# Patient Record
Sex: Male | Born: 1965
Health system: Southern US, Community
[De-identification: ages and names within clinical notes are randomized; demographics above are authoritative.]

## PROBLEM LIST (undated history)

## (undated) ENCOUNTER — Emergency Department: Payer: BC Managed Care – PPO

## (undated) DIAGNOSIS — Z87442 Personal history of urinary calculi: Secondary | ICD-10-CM

## (undated) DIAGNOSIS — R112 Nausea with vomiting, unspecified: Secondary | ICD-10-CM

## (undated) DIAGNOSIS — J45909 Unspecified asthma, uncomplicated: Secondary | ICD-10-CM

## (undated) DIAGNOSIS — M1990 Unspecified osteoarthritis, unspecified site: Secondary | ICD-10-CM

## (undated) DIAGNOSIS — M25512 Pain in left shoulder: Secondary | ICD-10-CM

## (undated) DIAGNOSIS — T7840XA Allergy, unspecified, initial encounter: Secondary | ICD-10-CM

## (undated) DIAGNOSIS — N200 Calculus of kidney: Secondary | ICD-10-CM

## (undated) DIAGNOSIS — Z9889 Other specified postprocedural states: Secondary | ICD-10-CM

## (undated) DIAGNOSIS — M7580 Other shoulder lesions, unspecified shoulder: Secondary | ICD-10-CM

## (undated) DIAGNOSIS — M25511 Pain in right shoulder: Secondary | ICD-10-CM

## (undated) DIAGNOSIS — M109 Gout, unspecified: Secondary | ICD-10-CM

## (undated) HISTORY — DX: Nausea with vomiting, unspecified: R11.2

## (undated) HISTORY — DX: Pain in right shoulder: M25.511

## (undated) HISTORY — DX: Pain in left shoulder: M25.512

## (undated) HISTORY — DX: Unspecified asthma, uncomplicated: J45.909

## (undated) HISTORY — DX: Other specified postprocedural states: Z98.890

## (undated) HISTORY — PX: APPENDECTOMY: SHX54

## (undated) HISTORY — DX: Other shoulder lesions, unspecified shoulder: M75.80

## (undated) HISTORY — DX: Allergy, unspecified, initial encounter: T78.40XA

## (undated) HISTORY — PX: TONSILLECTOMY: SUR1361

---

## 2001-05-29 HISTORY — PX: OTHER SURGICAL HISTORY: SHX169

## 2002-05-30 ENCOUNTER — Encounter (HOSPITAL_COMMUNITY): Admission: RE | Admit: 2002-05-30 | Discharge: 2002-06-29 | Payer: Self-pay | Admitting: Orthopedic Surgery

## 2003-02-27 ENCOUNTER — Ambulatory Visit: Admission: RE | Admit: 2003-02-27 | Discharge: 2003-02-27 | Payer: Self-pay | Admitting: Family Medicine

## 2004-05-31 ENCOUNTER — Encounter: Payer: Self-pay | Admitting: Orthopedic Surgery

## 2004-05-31 ENCOUNTER — Emergency Department (HOSPITAL_COMMUNITY): Admission: EM | Admit: 2004-05-31 | Discharge: 2004-05-31 | Payer: Self-pay | Admitting: Emergency Medicine

## 2004-06-02 ENCOUNTER — Emergency Department (HOSPITAL_COMMUNITY): Admission: EM | Admit: 2004-06-02 | Discharge: 2004-06-02 | Payer: Self-pay | Admitting: Emergency Medicine

## 2004-06-06 ENCOUNTER — Ambulatory Visit: Payer: Self-pay | Admitting: Orthopedic Surgery

## 2004-06-09 ENCOUNTER — Ambulatory Visit: Payer: Self-pay | Admitting: Orthopedic Surgery

## 2006-03-06 ENCOUNTER — Emergency Department (HOSPITAL_COMMUNITY): Admission: EM | Admit: 2006-03-06 | Discharge: 2006-03-06 | Payer: Self-pay | Admitting: Emergency Medicine

## 2006-03-09 ENCOUNTER — Emergency Department (HOSPITAL_COMMUNITY): Admission: EM | Admit: 2006-03-09 | Discharge: 2006-03-09 | Payer: Self-pay | Admitting: Emergency Medicine

## 2006-03-11 ENCOUNTER — Emergency Department (HOSPITAL_COMMUNITY): Admission: EM | Admit: 2006-03-11 | Discharge: 2006-03-11 | Payer: Self-pay | Admitting: *Deleted

## 2006-03-25 ENCOUNTER — Ambulatory Visit (HOSPITAL_COMMUNITY): Admission: RE | Admit: 2006-03-25 | Discharge: 2006-03-25 | Payer: Self-pay | Admitting: Urology

## 2006-04-16 ENCOUNTER — Ambulatory Visit (HOSPITAL_COMMUNITY): Admission: RE | Admit: 2006-04-16 | Discharge: 2006-04-16 | Payer: Self-pay | Admitting: Urology

## 2006-04-18 ENCOUNTER — Ambulatory Visit (HOSPITAL_COMMUNITY): Admission: RE | Admit: 2006-04-18 | Discharge: 2006-04-18 | Payer: Self-pay | Admitting: Urology

## 2006-04-18 ENCOUNTER — Emergency Department (HOSPITAL_COMMUNITY): Admission: EM | Admit: 2006-04-18 | Discharge: 2006-04-18 | Payer: Self-pay | Admitting: Emergency Medicine

## 2006-04-23 ENCOUNTER — Ambulatory Visit (HOSPITAL_COMMUNITY): Admission: RE | Admit: 2006-04-23 | Discharge: 2006-04-23 | Payer: Self-pay | Admitting: Urology

## 2006-09-03 ENCOUNTER — Emergency Department (HOSPITAL_COMMUNITY): Admission: EM | Admit: 2006-09-03 | Discharge: 2006-09-03 | Payer: Self-pay | Admitting: Emergency Medicine

## 2006-09-05 ENCOUNTER — Emergency Department (HOSPITAL_COMMUNITY): Admission: EM | Admit: 2006-09-05 | Discharge: 2006-09-05 | Payer: Self-pay | Admitting: Emergency Medicine

## 2006-09-07 ENCOUNTER — Emergency Department (HOSPITAL_COMMUNITY): Admission: EM | Admit: 2006-09-07 | Discharge: 2006-09-07 | Payer: Self-pay | Admitting: Emergency Medicine

## 2007-06-05 ENCOUNTER — Ambulatory Visit (HOSPITAL_COMMUNITY): Admission: RE | Admit: 2007-06-05 | Discharge: 2007-06-05 | Payer: Self-pay | Admitting: Family Medicine

## 2009-02-24 ENCOUNTER — Ambulatory Visit (HOSPITAL_COMMUNITY): Admission: RE | Admit: 2009-02-24 | Discharge: 2009-02-24 | Payer: Self-pay | Admitting: Urology

## 2009-03-04 ENCOUNTER — Ambulatory Visit (HOSPITAL_COMMUNITY): Admission: RE | Admit: 2009-03-04 | Discharge: 2009-03-04 | Payer: Self-pay | Admitting: Urology

## 2010-06-28 NOTE — Progress Notes (Signed)
Summary: Office Visit  Office Visit   Imported By: Jacklynn Ganong 09/04/2007 13:36:08  _____________________________________________________________________  External Attachment:    Type:   Image     Comment:   initial evaluation

## 2010-06-28 NOTE — Progress Notes (Signed)
Summary: Office Visit  Office Visit   Imported By: Jacklynn Ganong 09/04/2007 13:36:46  _____________________________________________________________________  External Attachment:    Type:   Image     Comment:   progress note

## 2010-10-14 NOTE — H&P (Signed)
Marcus Chen, Marcus Chen                ACCOUNT NO.:  [id number]   MEDICAL RECORD NO.:  [id number]          PATIENT TYPE:  AMB   LOCATION:  DAY                           FACILITY:  [facility]   PHYSICIAN:  Dennie Maizes, M.D.   DATE OF BIRTH:  1966-04-19   DATE OF ADMISSION:  04/18/2006  DATE OF DISCHARGE:  LH                                HISTORY & PHYSICAL   CHIEF COMPLAINT:  Intermittent left flank pain, left upper ureteral calculus  with obstruction.   HISTORY OF PRESENT ILLNESS:  This 45 year old male experienced sudden onset  of severe left flank pain radiating to the front about 3 months ago.  He  went to the emergency room at [facility].  He was evaluated with a  noncontrast CT scan of abdomen and pelvis.  This revealed bilateral small  renal calculi.  The patient had good pain relief at that time.   He had recurrence of the pain on the left side about a month ago.  He went  to the emergency room x3 at [facility].  CT scan has been  repeated.  This revealed multiple bilateral renal calculi without  obstruction.  There was a 3 mm size stone in the left upper ureter with  perinephric edema.  The patient has not passed a stone.  He has been having  intermittent left flank pain.  He did not have any severe voiding difficulty  or gross hematuria at present.  He is brought to the shortstay center today  for extracorporeal shockwave lithotripsy of the left upper ureteral  calculus.   PAST MEDICAL HISTORY:  Negative for urolithiasis, no medical illnesses.  Status post gastric bypass surgery in 2005.  The patient has lost about 135  pounds after the surgery.  Status post tonsillectomy, status post  appendectomy.   MEDICATIONS:  1. Hydromorphone.  2. Flomax.  3. Ventolin inhaler.   ALLERGIES:  PENICILLIN.   FAMILY HISTORY:  Positive for hypertension, heart disease, diabetes  mellitus, and elevated cholesterol.   PHYSICAL EXAMINATION:  GENERAL:  The patient  is comfortable at the time of  examination.  VITAL SIGNS:  Height 5 feet 10-1/2 inches, weight 175 pounds.  HEENT:  Normal.  LUNGS:  Clear to auscultation.  HEART:  Regular rate and rhythm with no murmurs.  ABDOMEN:  Soft, no palpable flank mass.  No costovertebral angle tenderness.  Bladder is not palpable.  No  cva tenderness.  Penis and testes are normal.   IMPRESSION:  Left flank pain, left ureteral calculus with obstructions,  bilateral small renal calculi.   PLAN:  1. Extracorporeal shockwave lithotripsy of the obstructing left upper      ureteral calculus with [facility].  I      discussed with the patient regarding the diagnosis, operative details,      alternate treatments, outcome, possible risks and complications and he      has agreed for the procedure to be done.  He may need additional      procedures for the stone.  Dennie Maizes, M.D.  Electronically Signed     SK/MEDQ  D:  04/17/2006  T:  04/18/2006  Job:  [id number]   cc:   Mila Homer. Sudie Bailey, M.D.  Fax: [phone]

## 2012-01-25 DIAGNOSIS — F909 Attention-deficit hyperactivity disorder, unspecified type: Secondary | ICD-10-CM

## 2012-03-23 ENCOUNTER — Emergency Department (HOSPITAL_COMMUNITY): Payer: PRIVATE HEALTH INSURANCE

## 2012-03-23 ENCOUNTER — Encounter (HOSPITAL_COMMUNITY): Payer: Self-pay | Admitting: *Deleted

## 2012-03-23 ENCOUNTER — Emergency Department (HOSPITAL_COMMUNITY)
Admission: EM | Admit: 2012-03-23 | Discharge: 2012-03-23 | Disposition: A | Discharge status: Home / Self Care | Payer: PRIVATE HEALTH INSURANCE | Attending: Emergency Medicine | Admitting: Emergency Medicine

## 2012-03-23 DIAGNOSIS — N23 Unspecified renal colic: Secondary | ICD-10-CM

## 2012-03-23 DIAGNOSIS — M109 Gout, unspecified: Secondary | ICD-10-CM | POA: Insufficient documentation

## 2012-03-23 DIAGNOSIS — R1032 Left lower quadrant pain: Secondary | ICD-10-CM | POA: Insufficient documentation

## 2012-03-23 DIAGNOSIS — N201 Calculus of ureter: Secondary | ICD-10-CM | POA: Insufficient documentation

## 2012-03-23 HISTORY — DX: Calculus of kidney: N20.0

## 2012-03-23 HISTORY — DX: Gout, unspecified: M10.9

## 2012-03-23 LAB — URINALYSIS, ROUTINE W REFLEX MICROSCOPIC
Bilirubin Urine: NEGATIVE
Glucose, UA: NEGATIVE mg/dL
Ketones, ur: NEGATIVE mg/dL
Leukocytes, UA: NEGATIVE
Nitrite: NEGATIVE
Protein, ur: 30 mg/dL — AB
Specific Gravity, Urine: >1.03 — ABNORMAL HIGH (ref 1.005–1.030)
Urobilinogen, UA: 0.2 mg/dL (ref 0.0–1.0)
pH: 5 (ref 5.0–8.0)

## 2012-03-23 LAB — URINE MICROSCOPIC-ADD ON

## 2012-03-23 MED ORDER — HYDROMORPHONE HCL PF 1 MG/ML IJ SOLN
1.0000 mg | Freq: Once | INTRAMUSCULAR | Status: AC
  Administered 2012-03-23: 1 mg via INTRAVENOUS

## 2012-03-23 MED ORDER — KETOROLAC TROMETHAMINE 30 MG/ML IJ SOLN
30.0000 mg | Freq: Once | INTRAMUSCULAR | Status: AC
  Administered 2012-03-23: 30 mg via INTRAVENOUS
  Filled 2012-03-23: qty 1

## 2012-03-23 MED ORDER — ONDANSETRON HCL 4 MG/2ML IJ SOLN
4.0000 mg | Freq: Once | INTRAMUSCULAR | Status: AC
  Administered 2012-03-23: 4 mg via INTRAVENOUS
  Filled 2012-03-23: qty 2

## 2012-03-23 MED ORDER — HYDROMORPHONE HCL PF 1 MG/ML IJ SOLN
0.5000 mg | Freq: Once | INTRAMUSCULAR | Status: AC
  Administered 2012-03-23: 0.5 mg via INTRAVENOUS
  Filled 2012-03-23: qty 1

## 2012-03-23 MED ORDER — FENTANYL CITRATE 0.05 MG/ML IJ SOLN
50.0000 ug | Freq: Once | INTRAMUSCULAR | Status: DC
  Filled 2012-03-23: qty 2

## 2012-03-23 MED ORDER — TAMSULOSIN HCL 0.4 MG PO CAPS: ORAL_CAPSULE | ORAL | Status: DC

## 2012-03-23 MED ORDER — HYDROMORPHONE HCL PF 1 MG/ML IJ SOLN
INTRAMUSCULAR | Status: AC
  Filled 2012-03-23: qty 1

## 2012-03-23 MED ORDER — OXYCODONE-ACETAMINOPHEN 5-325 MG PO TABS
2.0000 | ORAL_TABLET | Freq: Once | ORAL | Status: AC
  Administered 2012-03-23: 2 via ORAL
  Filled 2012-03-23: qty 2

## 2012-03-23 MED ORDER — HYDROMORPHONE HCL PF 1 MG/ML IJ SOLN
INTRAMUSCULAR | Status: AC
  Administered 2012-03-23: 1 mg via INTRAVENOUS
  Filled 2012-03-23: qty 1

## 2012-03-23 MED ORDER — OXYCODONE-ACETAMINOPHEN 5-325 MG PO TABS: 2.0000 | ORAL_TABLET | ORAL | Status: DC | PRN

## 2012-03-23 NOTE — ED Provider Notes (Signed)
History   This chart was scribed for Flint Melter, MD scribed by Magnus Sinning. The patient was seen in room [facility] at 13:22   CSN: [id number]  Arrival date & time 03/23/12  1303   Chief Complaint  Patient presents with  . Nephrolithiasis    (Consider location/radiation/quality/duration/timing/severity/associated sxs/prior treatment) The history is provided by the patient. No language interpreter was used.  Marcus Chen is a 46 y.o. male who presents to the Emergency Department complaining of constant moderate left lower quadrant abd pain that began approximately 45 minutes ago with an associated shooting pain to his left testicle and left lower back. Patient reports pain has been consistent with hx of kidney stones. 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 He says he initially attributed pain to pulling a muscle during the lift, but notes after he sat down, the pain intensified and began shooting into left testicle. Patient reports there are no modifying factors and does not provided any other associated sxs.   Past Medical History  Diagnosis Date  . Kidney stone   . Gout     Past Surgical History  Procedure Date  . Appendectomy     No family history on file.  History  Substance Use Topics  . Smoking status: Never Smoker   . Smokeless tobacco: Not on file  . Alcohol Use: No     occ      Review of Systems  Gastrointestinal: Positive for abdominal pain. Negative for nausea and vomiting.  Genitourinary: Positive for flank pain and testicular pain.  Musculoskeletal: Positive for back pain.  All other systems reviewed and are negative.    Allergies  Penicillins  Home Medications   Current Outpatient Rx  Name Route Sig Dispense Refill  . ALLOPURINOL 300 MG PO TABS Oral Take 300 mg by mouth daily.    . FEBUXOSTAT 80 MG PO TABS Oral Take 80 mg by mouth daily.    . IBUPROFEN 200 MG PO TABS  Oral Take 800 mg by mouth every 6 (six) hours as needed. Pain    . MELOXICAM 7.5 MG PO TABS Oral Take 7.5 mg by mouth daily.    . METHYLCELLULOSE 1 % OP SOLN Both Eyes Place 1 drop into both eyes as needed.    Marland Kitchen OMEPRAZOLE 20 MG PO CPDR Oral Take 20 mg by mouth daily.    . OXYCODONE-ACETAMINOPHEN 5-325 MG PO TABS Oral Take 2 tablets by mouth every 4 (four) hours as needed for pain. 30 tablet 0  . TAMSULOSIN HCL 0.4 MG PO CAPS  1 q HS to aid stone passage 7 capsule 0    BP 146/101  Pulse 91  Temp 97.3 F (36.3 C) (Oral)  Resp 24  SpO2 98%  Physical Exam  Nursing note and vitals reviewed. Constitutional: He is oriented to person, place, and time. He appears well-developed and well-nourished. No distress.  HENT:  Head: Normocephalic and atraumatic.  Eyes: Conjunctivae normal and EOM are normal.  Neck: Neck supple. No tracheal deviation present.  Cardiovascular: Normal rate and regular rhythm.   No murmur heard. Pulmonary/Chest: Effort normal and breath sounds normal. No respiratory distress. He has no wheezes. He has no rales.  Abdominal: Soft. He exhibits no distension. There is no tenderness.       No CVA tenderness.  Musculoskeletal: Normal range of motion.  Neurological: He is alert and oriented to person, place, and time.  No sensory deficit.  Skin: Skin is dry.  Psychiatric: He has a normal mood and affect. His behavior is normal.    ED Course  Procedures (including critical care time) DIAGNOSTIC STUDIES: Oxygen Saturation is 98% on room air , normal by my interpretation.    COORDINATION OF CARE: 13:43: Rechecked performed.Patient notes current nagging pain that he ranks between a 3 and 4 out of 10.  13:45: Asked the nurse to change the IV fluids to [facility]. 14:15: Rechecked performed. Patient provided he continues to have pain. Patient given third dose of Dilaudid and Zofran.  15:50: Informed patient that radiology shows there are stones distal to the kidneys with stones  noted in both kidneys. Patient states that the pain has been surging. Provided intent to place order for Toradol. 15:16: Patient notes improvement with the Toradol. Provided intent to d/c home with percocet rx and Flomax rx with instructions for rest and f/u with a urologist.      17:15-patient stated that he was not comfortable enough to leave. He was worried that his pain would not be able to be controlled at home. I contacted Dr. Jerre Simon, who agreed to see him as a Research scientist (medical). He suggested that the patient be admitted to hospitalist service. I presented this plan to the patient and he declined admission. He stated that he would see Dr. Jerre Simon in the office.           Labs Reviewed  URINALYSIS, ROUTINE W REFLEX MICROSCOPIC - Abnormal; Notable for the following:    Color, Urine AMBER (*)  BIOCHEMICALS MAY BE AFFECTED BY COLOR   APPearance CLOUDY (*)     Specific Gravity, Urine >1.030 (*)     Hgb urine dipstick LARGE (*)     Protein, ur 30 (*)     All other components within normal limits  URINE MICROSCOPIC-ADD ON - Abnormal; Notable for the following:    Bacteria, UA MANY (*)     All other components within normal limits  URINE CULTURE   Ct Abdomen Pelvis Wo Contrast  03/23/2012  *RADIOLOGY REPORT*  Clinical Data: 46 year old male with left-sided abdominal and pelvic pain.  CT ABDOMEN AND PELVIS WITHOUT CONTRAST  Technique:  Multidetector CT imaging of the abdomen and pelvis was performed following the standard protocol without intravenous contrast.  Comparison: 02/21/2009 CT  Findings: A 3 mm proximal left ureteral calculus is noted causing very mild left hydronephrosis.  Punctate nonobstructing bilateral renal calculi are identified, three on the left and two on the right.  Fatty infiltration of the liver is identified. The spleen, gallbladder, adrenal glands and pancreas are unremarkable.  Please note that parenchymal abnormalities may be missed as intravenous contrast was not  administered.  No free fluid, enlarged lymph nodes, biliary dilation or abdominal aortic aneurysm identified. Sigmoid colonic diverticulosis noted without evidence of diverticulitis. Surgical changes along the stomach and evidence of appendectomy are present.  No acute or suspicious bony abnormalities are identified. Degenerative changes in the lower lumbar spine again noted.  IMPRESSION: 3 mm proximal left ureteral calculus causing very mild left hydronephrosis.  Punctate nonobstructing bilateral renal calculi.  Fatty infiltration of the liver.   Original Report Authenticated By: Rosendo Gros, M.D.      1. Ureteral stone   2. Ureteral colic       MDM   Ureteral colic, secondary to proximal 3 mm stone. The stone has a greater than 75% chance of passage. Patient's symptoms controlled in the emergency department. Doubt  metabolic instability, serious bacterial infection or impending vascular collapse; the patient is stable for discharge.     I personally performed the services described in this documentation, which was scribed in my presence. The recorded information has been reviewed and considered.         Flint Melter, MD 03/23/12 1729

## 2012-03-23 NOTE — ED Notes (Signed)
Pt return from  CT states he still has some pain.

## 2012-03-23 NOTE — ED Notes (Signed)
Left flank pain radiating to left groin that started today.  Denies n/v.

## 2012-03-24 LAB — URINE CULTURE
Colony Count: NO GROWTH
Culture: NO GROWTH

## 2012-05-29 HISTORY — PX: ROTATOR CUFF REPAIR: SHX139

## 2013-11-26 ENCOUNTER — Ambulatory Visit (HOSPITAL_COMMUNITY)
Admission: RE | Admit: 2013-11-26 | Discharge: 2013-11-26 | Disposition: A | Discharge status: Home / Self Care | Payer: PRIVATE HEALTH INSURANCE | Source: Ambulatory Visit | Attending: Family Medicine | Admitting: Family Medicine

## 2013-11-26 ENCOUNTER — Other Ambulatory Visit (HOSPITAL_COMMUNITY): Payer: Self-pay | Admitting: Family Medicine

## 2013-11-26 DIAGNOSIS — M545 Low back pain, unspecified: Secondary | ICD-10-CM

## 2013-11-26 DIAGNOSIS — R079 Chest pain, unspecified: Secondary | ICD-10-CM

## 2014-01-13 ENCOUNTER — Other Ambulatory Visit (HOSPITAL_COMMUNITY): Payer: Self-pay | Admitting: Orthopedic Surgery

## 2014-01-13 DIAGNOSIS — M25511 Pain in right shoulder: Secondary | ICD-10-CM

## 2014-01-16 ENCOUNTER — Ambulatory Visit (HOSPITAL_COMMUNITY)
Admission: RE | Admit: 2014-01-16 | Discharge: 2014-01-16 | Disposition: A | Discharge status: Home / Self Care | Payer: PRIVATE HEALTH INSURANCE | Source: Ambulatory Visit | Attending: Orthopedic Surgery | Admitting: Orthopedic Surgery

## 2014-01-16 DIAGNOSIS — M6790 Unspecified disorder of synovium and tendon, unspecified site: Secondary | ICD-10-CM | POA: Insufficient documentation

## 2014-01-16 DIAGNOSIS — M25511 Pain in right shoulder: Secondary | ICD-10-CM

## 2014-01-16 DIAGNOSIS — S46819A Strain of other muscles, fascia and tendons at shoulder and upper arm level, unspecified arm, initial encounter: Secondary | ICD-10-CM | POA: Insufficient documentation

## 2014-01-16 DIAGNOSIS — X58XXXA Exposure to other specified factors, initial encounter: Secondary | ICD-10-CM | POA: Diagnosis not present

## 2014-01-16 DIAGNOSIS — S43499A Other sprain of unspecified shoulder joint, initial encounter: Secondary | ICD-10-CM | POA: Insufficient documentation

## 2014-01-16 DIAGNOSIS — M719 Bursopathy, unspecified: Secondary | ICD-10-CM | POA: Insufficient documentation

## 2014-01-16 DIAGNOSIS — M25519 Pain in unspecified shoulder: Secondary | ICD-10-CM | POA: Diagnosis present

## 2014-02-10 ENCOUNTER — Ambulatory Visit (HOSPITAL_COMMUNITY)
Admission: RE | Admit: 2014-02-10 | Discharge: 2014-02-10 | Disposition: A | Discharge status: Home / Self Care | Payer: PRIVATE HEALTH INSURANCE | Source: Ambulatory Visit | Attending: Orthopedic Surgery | Admitting: Orthopedic Surgery

## 2014-02-10 DIAGNOSIS — M25611 Stiffness of right shoulder, not elsewhere classified: Secondary | ICD-10-CM | POA: Insufficient documentation

## 2014-02-10 DIAGNOSIS — M6281 Muscle weakness (generalized): Secondary | ICD-10-CM | POA: Diagnosis not present

## 2014-02-10 DIAGNOSIS — M25519 Pain in unspecified shoulder: Secondary | ICD-10-CM | POA: Diagnosis not present

## 2014-02-10 DIAGNOSIS — M25619 Stiffness of unspecified shoulder, not elsewhere classified: Secondary | ICD-10-CM | POA: Insufficient documentation

## 2014-02-10 DIAGNOSIS — Z9889 Other specified postprocedural states: Secondary | ICD-10-CM | POA: Insufficient documentation

## 2014-02-10 DIAGNOSIS — M25511 Pain in right shoulder: Secondary | ICD-10-CM

## 2014-02-10 DIAGNOSIS — IMO0001 Reserved for inherently not codable concepts without codable children: Secondary | ICD-10-CM | POA: Insufficient documentation

## 2014-02-10 NOTE — Evaluation (Signed)
Occupational Therapy Evaluation  Patient Details  Name: Marcus Chen MRN: [id number] Date of Birth: [birth year]  Today's Date: 02/10/2014 Time: 7209-4709 OT Time Calculation (min): 44 min OT eval: 6283-6629 44'  Visit#: 1 of 24  Re-eval: 03/10/14  Assessment Diagnosis: Right rotator cuff repair Surgical Date: 01/28/14 Next MD Visit: 03/03/2014   Past Medical History:  Past Medical History  Diagnosis Date  . Kidney stone   . Gout    Past Surgical History:  Past Surgical History  Procedure Laterality Date  . Appendectomy      Subjective Symptoms/Limitations Symptoms: S: It starts to pinch and really hurt right at my bicep.  Pertinent History: Pt is a 48 y/o male s/p right rotator cuff repair (01/28/14), pt states that bone spurs were removed from shoulder.  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 Patient reports difficulty with all daily and work tasks, as well as has difficulty sleeping due to pain in his right shoulder. Pt reports he preciously had surgery on his left shoulder and requested therapy include TENS for pain management as it seemed to help significantly for his left shoulder. Dr. Ronnie Derby referred patient to occupational therapy for evaluation and treatment.    Special Tests: FOTO score: 39/100 Patient Stated Goals: To get my arm working like normal so I can do everything I need to.  Pain Assessment Currently in Pain?: Other (Comment) (No pain at time of eval. Pt reports pain of 4/10 at night and pain of 7/10 with movement) Pain Relieving Factors: Pt takes pain medication on a 6 hour schedule. Pt reports alternating ice and heat for pain management.   Precautions/Restrictions  Precautions Precautions: Shoulder Type of Shoulder Precautions: PROM for 4 weeks (03/10/14). Progress to AAROM, AROM, and strengthening as tolerated.  Precaution Comments: No lifting over 20# and no lifting over the head  Balance  Screening Balance Screen Has the patient fallen in the past 6 months: No Has the patient had a decrease in activity level because of a fear of falling? : No Is the patient reluctant to leave their home because of a fear of falling? : No  Prior [place] expects to be discharged to:: Private residence Living Arrangements: Spouse/significant other;Children Prior Function Level of Independence: Independent with basic ADLs  Able to Take Stairs?: Yes Driving: Yes Vocation: Full time employment Vocation Requirements: Airline pilot at [employer] -Work requirements lifting large loads, climbing, reaching, crawling, carrying heavy loads Leisure: Hobbies-yes (Comment) Comments: has been renovating his house-would like to return to that  Assessment ADL/Vision/Perception ADL ADL Comments: Has difficulty with everything-dressing, work functions (firefighter/engineer), reaching & lifting tasks.  Dominant Hand: Right Vision - History Baseline Vision: No visual deficits  Cognition/Observation Cognition Overall Cognitive Status: Within Functional Limits for tasks assessed Arousal/Alertness: Awake/alert Orientation Level: Oriented X4 Observation/Other Assessments Observations: Scar along right shoulder measures 3 cm    Additional Assessments RUE Assessment RUE Assessment:  (AROM assessed seated. PROM assessed supine. IR/ER adducted) RUE AROM (degrees) Right Shoulder Flexion: 74 Degrees Right Shoulder ABduction: 91 Degrees Right Shoulder Internal Rotation: 64 Degrees Right Shoulder External Rotation: 13 Degrees RUE PROM (degrees) Right Shoulder Flexion: 140 Degrees Right Shoulder ABduction: 84 Degrees Right Shoulder Internal Rotation: 90 Degrees Right Shoulder External Rotation: 53 Degrees Palpation Palpation: Mod fascial restrictions in upper arm, trapezius, and scapularis region.           Occupational Therapy Assessment and Plan OT  Assessment and Plan Clinical Impression Statement: A: Pt is a 48 y/o male s/p right rotator cuff repair, causing increased pain, fascial restrictions, decreased strength, and decreased joint mobility resulting in difficulty completing daily tasks and work activities.  Pt will benefit from skilled therapeutic intervention in order to improve on the following deficits: Increased fascial restricitons;Impaired UE functional use;Pain;Decreased strength;Decreased range of motion Rehab Potential: Excellent OT Frequency: Min 2X/week OT Duration: 12 weeks OT Treatment/Interventions: Self-care/ADL training;Therapeutic exercise;Therapeutic activities;Manual therapy;Patient/family education;Modalities OT Plan: Pt will benefit from skilled OT interventions to decrease pain, increase ROM, increase strength, and increase overall RUE functional use. Treatment Plan: PROM, AAROM, and AROM, MFR and manual stretching, scapular strengthening and proximal stabilization, RUE general strengthening. Utilize TENS for pain management as pt reports that helped with pain during previous therapy for left shoulder.     Goals Short Term Goals Time to Complete Short Term Goals: 6 weeks Short Term Goal 1: Patient will be educated on HEP.  Short Term Goal 2: Patient will decrease pain to 4/10 or less during daily tasks.  Short Term Goal 3: Patient will decrease fascial restrictions from mod to min amount.  Short Term Goal 4: Patient will increase AROM to [facility] to increase ability to complete daily tasks above shoulder level.  Short Term Goal 5: Patient will increase strength to 3/5 to increase ability to use right arm during work tasks, including reaching and carrying objects.  Long Term Goals Time to Complete Long Term Goals: 12 weeks Long Term Goal 1: Patient will return to highest level of independence during B/IADL tasks.  Long Term Goal 2: Patient will decrease pain to 2/10 or less to increase ability to sleep at night.  Long  Term Goal 3: Patient will decrease fascial restrictions to trace amount or less.  Long Term Goal 4: Patient will increase AROM to WNL to increase ability to complete daily and work tasks above shoulder level.  Long Term Goal 5: Patient will increase strength to 5/5 to increase ability to use right arm during work tasks, including reaching, lifting, and carrying heavy objects.   Problem List Patient Active Problem List   Diagnosis Date Noted  . S/P rotator cuff repair 02/10/2014  . Pain in joint, shoulder region 02/10/2014  . Muscle weakness (generalized) 02/10/2014  . Decreased range of motion of right shoulder 02/10/2014    OT Plan of Care OT Home Exercise Plan: towel slides OT Patient Instructions: handout (scanned)  Consulted and Agree with Plan of Care: Patient      [name]. OT Student  02/10/2014, 4:52 PM  Physician Documentation Your signature is required to indicate approval of the treatment plan as stated above.  Please sign and either send electronically or make a copy of this report for your files and return this physician signed original.  Please mark one 1.__approve of plan  2. ___approve of plan with the following conditions.   ______________________________                                                          _____________________ Physician Signature  Date  

## 2014-02-10 NOTE — Evaluation (Signed)
Note reviewed by clinical instructor and accurately reflects treatment session.  Echo Propp, OTR/L,CBIS   

## 2014-02-11 ENCOUNTER — Ambulatory Visit (HOSPITAL_COMMUNITY)
Admission: RE | Admit: 2014-02-11 | Discharge: 2014-02-11 | Disposition: A | Discharge status: Home / Self Care | Payer: PRIVATE HEALTH INSURANCE | Source: Ambulatory Visit | Attending: Orthopedic Surgery | Admitting: Orthopedic Surgery

## 2014-02-11 DIAGNOSIS — IMO0001 Reserved for inherently not codable concepts without codable children: Secondary | ICD-10-CM | POA: Diagnosis not present

## 2014-02-11 NOTE — Progress Notes (Signed)
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  When it catches its off the chart." Pain Assessment Currently in Pain?: Yes Pain Score: 3  Pain Location: Shoulder Pain Orientation: Right  Exercise/Treatments Supine Protraction: PROM;10 reps Horizontal ABduction: PROM;10 reps External Rotation: PROM;10 reps Internal Rotation: PROM;10 reps Flexion: PROM;10 reps ABduction: PROM;10 reps Seated Elevation: AROM;10 reps Extension: AROM;10 reps Row: AROM;10 reps    Manual Therapy Manual Therapy: Myofascial release Myofascial Release: Myofascial release (MFR) and manual stretching to Right bicep, upper arm, upper trap and scapular regions to decrease fascial restrictions and promote decreased pain with movement.  Occupational Therapy Assessment and Plan OT Assessment and Plan Clinical Impression Statement: Initiated MFR and manual stretching this session.  Pt with incresed pain during MFR, but verbalized decreased tightness after.  Pt's overall pain decreased from 3/10 to 1/10 at rest after stretching. OT Plan: Add ball stretches   Goals Short Term Goals Short Term Goal 1: Patient will be educated on HEP.  Short Term Goal 1 Progress: Progressing toward goal Short Term Goal 2: Patient will decrease pain to 4/10 or less during daily tasks.  Short Term Goal 2 Progress: Progressing toward goal Short Term Goal 3: Patient will decrease fascial restrictions from mod to min amount.  Short Term Goal 3  Progress: Progressing toward goal Short Term Goal 4: Patient will increase AROM to [facility] to increase ability to complete daily tasks above shoulder level.  Short Term Goal 4 Progress: Progressing toward goal Short Term Goal 5: Patient will increase strength to 3/5 to increase ability to use right arm during work tasks, including reaching and carrying objects.  Short Term Goal 5 Progress: Progressing toward goal Long Term Goals Long Term Goal 1: Patient will return to highest level of independence during B/IADL tasks.  Long Term Goal 1 Progress: Progressing toward goal Long Term Goal 2: Patient will decrease pain to 2/10 or less to increase ability to sleep at night.  Long Term Goal 2 Progress: Progressing toward goal Long Term Goal 3: Patient will decrease fascial restrictions to trace amount or less.  Long Term Goal 3 Progress: Progressing toward goal Long Term Goal 4: Patient will increase AROM to WNL to increase ability to complete daily and work tasks above shoulder level.  Long Term Goal 4 Progress: Progressing toward goal Long Term Goal 5: Patient will increase strength to 5/5 to increase ability to use right arm during work tasks, including reaching, lifting, and carrying heavy objects.  Long Term Goal 5 Progress: Progressing toward goal  Problem List Patient Active Problem List   Diagnosis Date Noted  . S/P rotator cuff repair 02/10/2014  . Pain in joint, shoulder region 02/10/2014  . Muscle weakness (generalized) 02/10/2014  . Decreased range of motion of right shoulder 02/10/2014    End of Session Activity Tolerance: Patient tolerated treatment well General Behavior During Therapy: [facility] for tasks assessed/performed  GO    Bea Graff, [place], OTR/L [phone]  02/11/2014, 1:04 PM

## 2014-02-17 ENCOUNTER — Ambulatory Visit (HOSPITAL_COMMUNITY)
Admission: RE | Admit: 2014-02-17 | Discharge: 2014-02-17 | Disposition: A | Discharge status: Home / Self Care | Payer: PRIVATE HEALTH INSURANCE | Source: Ambulatory Visit | Attending: Orthopedic Surgery | Admitting: Orthopedic Surgery

## 2014-02-17 DIAGNOSIS — IMO0001 Reserved for inherently not codable concepts without codable children: Secondary | ICD-10-CM | POA: Diagnosis not present

## 2014-02-17 NOTE — Progress Notes (Signed)
Occupational Therapy Treatment Patient Details  Name: Marcus Chen MRN: [id number] Date of Birth: [birth year]  Today's Date: 02/17/2014 Time: 4854-6270 OT Time Calculation (min): 45 min Manual therapy 3500-9381 18' Therapeutic exercises 8299-3716 10' IFES with heat 1145-1200 15'  Visit#: 3 of 24  Re-eval: 03/10/14    Subjective  S:  Im just having a tough time with this.  It hurts alot Limitations: PROM for 4 weeks (03/10/14). Progress to AAROM, AROM, and strengthening as tolerated.  Pain Assessment Currently in Pain?: Yes Pain Score: 4  Pain Location: Shoulder Pain Orientation: Right Pain Type: Acute pain Pain Relieving Factors: hurts mostly at night, can't sleep.  Precautions/Restrictions  Precautions Type of Shoulder Precautions: PROM for 4 weeks (03/10/14). Progress to AAROM, AROM, and strengthening as tolerated.   Exercise/Treatments Supine Protraction: PROM;10 reps Horizontal ABduction: PROM;10 reps External Rotation: PROM;10 reps Internal Rotation: PROM;10 reps Flexion: PROM;10 reps ABduction: PROM;10 reps Other Supine Exercises: elbow flexion extension supination. pronation wrist flexion extension 10 times each A/PROM  Isometric Strengthening   Flexion: Supine;3X3" Extension: Supine;3X3" External Rotation: Supine;3X3" Internal Rotation: Supine;3X3" ABduction: Supine;3X3" ADduction: Supine;3X3" (elbow flexion and extension supine 3X3")    Modalities Modalities: Electrical Stimulation Manual Therapy Manual Therapy: Myofascial release Myofascial Release: Myofascial release (MFR) and manual stretching to Right bicep, upper arm, upper trap and scapular regions to decrease fascial restrictions and promote decreased pain with movement. Gaffer Location: IFES to right shoulder with patient in supine  with heat. Electrical Stimulation Action: Radiation protection practitioner Parameters: 16.0 for 15 minutes Electrical  Stimulation Goals: Pain  Occupational Therapy Assessment and Plan OT Assessment and Plan Clinical Impression Statement: A:  Added IFES with heat this date for pain reduction.  Patient felt some relief and decrease in pain with use of heat. OT Plan: P:  Add ball stretches and resume elev, ext, row.   Goals Short Term Goals Short Term Goal 1: Patient will be educated on HEP.  Short Term Goal 1 Progress: Progressing toward goal Short Term Goal 2: Patient will decrease pain to 4/10 or less during daily tasks.  Short Term Goal 2 Progress: Progressing toward goal Short Term Goal 3: Patient will decrease fascial restrictions from mod to min amount.  Short Term Goal 3 Progress: Progressing toward goal Short Term Goal 4: Patient will increase AROM to [facility] to increase ability to complete daily tasks above shoulder level.  Short Term Goal 4 Progress: Progressing toward goal Short Term Goal 5: Patient will increase strength to 3/5 to increase ability to use right arm during work tasks, including reaching and carrying objects.  Short Term Goal 5 Progress: Progressing toward goal Long Term Goals Long Term Goal 1: Patient will return to highest level of independence during B/IADL tasks.  Long Term Goal 1 Progress: Progressing toward goal Long Term Goal 2: Patient will decrease pain to 2/10 or less to increase ability to sleep at night.  Long Term Goal 2 Progress: Progressing toward goal Long Term Goal 3: Patient will decrease fascial restrictions to trace amount or less.  Long Term Goal 3 Progress: Progressing toward goal Long Term Goal 4: Patient will increase AROM to WNL to increase ability to complete daily and work tasks above shoulder level.  Long Term Goal 4 Progress: Progressing toward goal Long Term Goal 5: Patient will increase strength to 5/5 to increase ability to use right arm during work tasks, including reaching, lifting, and carrying heavy objects.  Long Term Goal 5 Progress: Progressing  toward  goal  Problem List Patient Active Problem List   Diagnosis Date Noted  . S/P rotator cuff repair 02/10/2014  . Pain in joint, shoulder region 02/10/2014  . Muscle weakness (generalized) 02/10/2014  . Decreased range of motion of right shoulder 02/10/2014    End of Session Activity Tolerance: Patient tolerated treatment well General Behavior During Therapy: [facility] for tasks assessed/performed  [place], OTR/L [phone]  02/17/2014, 11:53 AM

## 2014-02-18 ENCOUNTER — Ambulatory Visit (HOSPITAL_COMMUNITY)
Admission: RE | Admit: 2014-02-18 | Discharge: 2014-02-18 | Disposition: A | Discharge status: Home / Self Care | Payer: PRIVATE HEALTH INSURANCE | Source: Ambulatory Visit | Attending: Orthopedic Surgery | Admitting: Orthopedic Surgery

## 2014-02-18 DIAGNOSIS — IMO0001 Reserved for inherently not codable concepts without codable children: Secondary | ICD-10-CM | POA: Diagnosis not present

## 2014-02-18 NOTE — Progress Notes (Signed)
Occupational Therapy Treatment Patient Details  Name: Marcus Chen MRN: [id number] Date of Birth: Nov 24, 1965  Today's Date: 02/18/2014 Time: 4742-5956 OT Time Calculation (min): 41 min Manual therapy 3875-6433 28' Therapeutic exercises 2951-8841 13'  Visit#: 4 of 24  Re-eval: 03/10/14     Subjective S:  Whatever we did yesterday flared it up! Limitations: PROM for 4 weeks (03/10/14). Progress to AAROM, AROM, and strengthening as tolerated.  Pain Assessment Currently in Pain?: Yes Pain Score: 4  Pain Location: Shoulder Pain Orientation: Right Pain Type: Acute pain  Precautions/Restrictions     Exercise/Treatments Supine Protraction: PROM;10 reps Horizontal ABduction: PROM;10 reps External Rotation: PROM;10 reps Internal Rotation: PROM;10 reps Flexion: PROM;10 reps ABduction: PROM;10 reps Other Supine Exercises: elbow flexion extension supination. pronation wrist flexion extension 10 times each A/PROM  Seated Elevation: AROM;12 reps Extension: AROM;12 reps Row: AROM;12 reps Therapy Ball Flexion: 10 reps ABduction: 10 reps ROM / Strengthening / Isometric Strengthening   Flexion: Supine;3X5" Extension: Supine;3X5" External Rotation: Supine;3X3" Internal Rotation: Supine;3X5" ABduction: Supine;3X5" ADduction: Supine;3X5" (elbow flexion and extension 5" X 3)    Manual Therapy Manual Therapy: Myofascial release Myofascial Release: Myofascial release (MFR) and manual stretching to Right bicep, upper arm, upper trap and scapular regions to decrease fascial restrictions and promote decreased pain with movement.  Occupational Therapy Assessment and Plan OT Assessment and Plan Clinical Impression Statement: A:  Did not complete IFES as it may have irritated shoulder at previous visit.  Added ball stretches this date.  OT Plan: P:  Discuss increasing to 3 times per week.    Goals Short Term Goals Short Term Goal 1: Patient will be educated on HEP.  Short Term Goal  2: Patient will decrease pain to 4/10 or less during daily tasks.  Short Term Goal 3: Patient will decrease fascial restrictions from mod to min amount.  Short Term Goal 4: Patient will increase AROM to [facility] to increase ability to complete daily tasks above shoulder level.  Short Term Goal 5: Patient will increase strength to 3/5 to increase ability to use right arm during work tasks, including reaching and carrying objects.  Long Term Goals Long Term Goal 1: Patient will return to highest level of independence during B/IADL tasks.  Long Term Goal 2: Patient will decrease pain to 2/10 or less to increase ability to sleep at night.  Long Term Goal 3: Patient will decrease fascial restrictions to trace amount or less.  Long Term Goal 4: Patient will increase AROM to WNL to increase ability to complete daily and work tasks above shoulder level.  Long Term Goal 5: Patient will increase strength to 5/5 to increase ability to use right arm during work tasks, including reaching, lifting, and carrying heavy objects.   Problem List Patient Active Problem List   Diagnosis Date Noted  . S/P rotator cuff repair 02/10/2014  . Pain in joint, shoulder region 02/10/2014  . Muscle weakness (generalized) 02/10/2014  . Decreased range of motion of right shoulder 02/10/2014    End of Session Activity Tolerance: Patient tolerated treatment well General Behavior During Therapy: [facility] for tasks assessed/performed  [place], OTR/L [phone]  02/18/2014, 4:09 PM

## 2014-02-19 ENCOUNTER — Ambulatory Visit (HOSPITAL_COMMUNITY): Payer: PRIVATE HEALTH INSURANCE

## 2014-02-20 ENCOUNTER — Ambulatory Visit (HOSPITAL_COMMUNITY)
Admission: RE | Admit: 2014-02-20 | Discharge: 2014-02-20 | Disposition: A | Discharge status: Home / Self Care | Payer: PRIVATE HEALTH INSURANCE | Source: Ambulatory Visit | Attending: Orthopedic Surgery | Admitting: Orthopedic Surgery

## 2014-02-20 DIAGNOSIS — IMO0001 Reserved for inherently not codable concepts without codable children: Secondary | ICD-10-CM | POA: Diagnosis not present

## 2014-02-20 NOTE — Progress Notes (Signed)
Occupational Therapy Treatment Patient Details  Name: Marcus Chen MRN: [id number] Date of Birth: April 11, 1966  Today's Date: 02/20/2014 Time: 7408-1448 OT Time Calculation (min): 50 min Manual 1442-1510 (28') Therapeutic Exercises 1510-1532 (44')  Visit#: 5 of 24  Re-eval: 03/10/14    Authorization:    Authorization Time Period:    Authorization Visit#:   of    Subjective Symptoms/Limitations Symptoms: "ive tried sleeping in a recliner, and I just can't do it.  I've been thinking about trying melatonin." Pain Assessment Currently in Pain?: Yes Pain Score: 2  (nagging) Pain Location: Shoulder Pain Orientation: Right Pain Type: Acute pain  Exercise/Treatments Supine Protraction: PROM;10 reps Horizontal ABduction: PROM;10 reps External Rotation: PROM;10 reps Internal Rotation: PROM;10 reps Flexion: PROM;10 reps ABduction: PROM;10 reps Other Supine Exercises: elbow flexion extension supination. pronation 10 times each A/PROM  ROM / Strengthening / Isometric Strengthening   Flexion: Supine;3X5" Extension: Supine;3X5" External Rotation: Supine;3X5" Internal Rotation: Supine;3X5" ABduction: Supine;3X5" ADduction: Supine;3X5" (elbow flexion and extension 3x5")     Manual Therapy Manual Therapy: Myofascial release Myofascial Release: Myofascial release (MFR) and manual stretching to Right bicep, upper arm, upper trap and scapular regions to decrease fascial restrictions and promote decreased pain with movement.    Occupational Therapy Assessment and Plan OT Assessment and Plan Clinical Impression Statement: Pt had some increased pain and fascial restrictions today - improved with myofascial release and stretching.  pt had good strenght with isometrics.  Pt agrees taht increasing to 3x per week would be beneficial for iimproved ROM/strength, and has adjusted his scheudle - pt reports he will contact MD in regards to updating order. OT Plan: Follow up about MD order for 3x  per week.  contniue ball stretches and increase isometrics to 5x5"   Goals Short Term Goals Short Term Goal 1: Patient will be educated on HEP.  Short Term Goal 1 Progress: Progressing toward goal Short Term Goal 2: Patient will decrease pain to 4/10 or less during daily tasks.  Short Term Goal 2 Progress: Progressing toward goal Short Term Goal 3: Patient will decrease fascial restrictions from mod to min amount.  Short Term Goal 3 Progress: Progressing toward goal Short Term Goal 4: Patient will increase AROM to [facility] to increase ability to complete daily tasks above shoulder level.  Short Term Goal 4 Progress: Progressing toward goal Short Term Goal 5: Patient will increase strength to 3/5 to increase ability to use right arm during work tasks, including reaching and carrying objects.  Short Term Goal 5 Progress: Progressing toward goal Long Term Goals Long Term Goal 1: Patient will return to highest level of independence during B/IADL tasks.  Long Term Goal 1 Progress: Progressing toward goal Long Term Goal 2: Patient will decrease pain to 2/10 or less to increase ability to sleep at night.  Long Term Goal 2 Progress: Progressing toward goal Long Term Goal 3: Patient will decrease fascial restrictions to trace amount or less.  Long Term Goal 3 Progress: Progressing toward goal Long Term Goal 4: Patient will increase AROM to WNL to increase ability to complete daily and work tasks above shoulder level.  Long Term Goal 4 Progress: Progressing toward goal Long Term Goal 5: Patient will increase strength to 5/5 to increase ability to use right arm during work tasks, including reaching, lifting, and carrying heavy objects.  Long Term Goal 5 Progress: Progressing toward goal  Problem List Patient Active Problem List   Diagnosis Date Noted  . S/P rotator cuff repair 02/10/2014  .  Pain in joint, shoulder region 02/10/2014  . Muscle weakness (generalized) 02/10/2014  . Decreased range of  motion of right shoulder 02/10/2014    End of Session Activity Tolerance: Patient tolerated treatment well General Behavior During Therapy: [facility] for tasks assessed/performed  GO    Bea Graff, [place], OTR/L [phone]  02/20/2014, 3:40 PM

## 2014-02-23 ENCOUNTER — Ambulatory Visit (HOSPITAL_COMMUNITY)
Admission: RE | Admit: 2014-02-23 | Discharge: 2014-02-23 | Disposition: A | Discharge status: Home / Self Care | Payer: PRIVATE HEALTH INSURANCE | Source: Ambulatory Visit | Attending: Orthopedic Surgery | Admitting: Orthopedic Surgery

## 2014-02-23 DIAGNOSIS — IMO0001 Reserved for inherently not codable concepts without codable children: Secondary | ICD-10-CM | POA: Diagnosis not present

## 2014-02-23 NOTE — Progress Notes (Signed)
Occupational Therapy Treatment Patient Details  Name: Marcus Chen MRN: [id number] Date of Birth: December 20, 1965  Today's Date: 02/23/2014 Time: 1696-7893 OT Time Calculation (min): 42 min Manual 8101-7510 (26') Therapeutic Exercises 1133-1149 (16')  Visit#: 6 of 24  Re-eval: 03/10/14    Authorization:    Authorization Time Period:    Authorization Visit#:   of    Subjective Symptoms/Limitations Symptoms: "imade the mistake of not doing anything, and its horrible today." Limitations: PROM for 4 weeks (03/10/14). Progress to AAROM, AROM, and strengthening as tolerated.  Pain Assessment Currently in Pain?: Yes Pain Score: 3  (with increased medication) Pain Location: Shoulder Pain Orientation: Right Pain Type: Acute pain  Precautions/Restrictions     Exercise/Treatments Supine Protraction: PROM;10 reps Horizontal ABduction: PROM;10 reps External Rotation: PROM;10 reps Internal Rotation: PROM;10 reps Flexion: PROM;10 reps ABduction: PROM;10 reps Seated Elevation: AROM;15 reps Extension: AROM;15 reps Row: AROM;15 reps Therapy Ball Flexion:  (12 reps) ABduction:  (12 reps) ROM / Strengthening / Isometric Strengthening   Flexion: 5X5" Extension: 5X5" External Rotation: 5X5" Internal Rotation: 5X5" ABduction: 5X5" ADduction: 5X5" (elbow flexion and extension 5x5")    Manual Therapy Manual Therapy: Myofascial release Myofascial Release: Myofascial release (MFR) and manual stretching to Right bicep, upper arm, upper trap and scapular regions to decrease fascial restrictions and promote decreased pain with movement.     Occupational Therapy Assessment and Plan OT Assessment and Plan Clinical Impression Statement: pt had increased pain this session, because per pt he did not stretch at all yesterday.  had decreased pain and fascial restricions with MFR and manual stretching.  Increased isometrics to 5x5" with good tolerance.  continued ball stretches and increased to 12  reps wtih good tolerance.  pt has not called MD re order, to OTR caled MD office - PA to call back with updated order. OT Plan: continue MFR and manual stretching, ball stretches.  Add pro/ret/elev/dep.   Goals Short Term Goals Short Term Goal 1: Patient will be educated on HEP.  Short Term Goal 1 Progress: Progressing toward goal Short Term Goal 2: Patient will decrease pain to 4/10 or less during daily tasks.  Short Term Goal 2 Progress: Progressing toward goal Short Term Goal 3: Patient will decrease fascial restrictions from mod to min amount.  Short Term Goal 3 Progress: Progressing toward goal Short Term Goal 4: Patient will increase AROM to [facility] to increase ability to complete daily tasks above shoulder level.  Short Term Goal 4 Progress: Progressing toward goal Short Term Goal 5: Patient will increase strength to 3/5 to increase ability to use right arm during work tasks, including reaching and carrying objects.  Short Term Goal 5 Progress: Progressing toward goal Long Term Goals Long Term Goal 1: Patient will return to highest level of independence during B/IADL tasks.  Long Term Goal 1 Progress: Progressing toward goal Long Term Goal 2: Patient will decrease pain to 2/10 or less to increase ability to sleep at night.  Long Term Goal 2 Progress: Progressing toward goal Long Term Goal 3: Patient will decrease fascial restrictions to trace amount or less.  Long Term Goal 3 Progress: Progressing toward goal Long Term Goal 4: Patient will increase AROM to WNL to increase ability to complete daily and work tasks above shoulder level.  Long Term Goal 4 Progress: Progressing toward goal Long Term Goal 5: Patient will increase strength to 5/5 to increase ability to use right arm during work tasks, including reaching, lifting, and carrying heavy objects.  Long  Term Goal 5 Progress: Progressing toward goal  Problem List Patient Active Problem List   Diagnosis Date Noted  . S/P rotator  cuff repair 02/10/2014  . Pain in joint, shoulder region 02/10/2014  . Muscle weakness (generalized) 02/10/2014  . Decreased range of motion of right shoulder 02/10/2014    End of Session Activity Tolerance: Patient tolerated treatment well General Behavior During Therapy: [facility] for tasks assessed/performed  GO    Bea Graff, [place], OTR/L [phone]  02/23/2014, 12:06 PM

## 2014-02-25 ENCOUNTER — Ambulatory Visit (HOSPITAL_COMMUNITY)
Admission: RE | Admit: 2014-02-25 | Discharge: 2014-02-25 | Disposition: A | Discharge status: Home / Self Care | Payer: PRIVATE HEALTH INSURANCE | Source: Ambulatory Visit

## 2014-02-25 DIAGNOSIS — IMO0001 Reserved for inherently not codable concepts without codable children: Secondary | ICD-10-CM | POA: Diagnosis not present

## 2014-02-25 NOTE — Progress Notes (Signed)
Occupational Therapy Treatment Patient Details  Name: LY BACCHI MRN: [id number] Date of Birth: 1966/04/16  Today's Date: 02/25/2014 Time: 5784-6962 OT Time Calculation (min): 47 min Manual 1101-1128 (27') Therapeutic Exercises 1128-1148 (20')  Visit#: 7 of 24  Re-eval: 03/10/14    Authorization:    Authorization Time Period:    Authorization Visit#:   of    Subjective Symptoms/Limitations Symptoms: "I'm normal today. Just at a nagging two.  Not as bad as i was the other day." Pain Assessment Currently in Pain?: Yes Pain Score: 2   Precautions/Restrictions     Exercise/Treatments Supine Protraction: PROM;10 reps Horizontal ABduction: PROM;10 reps External Rotation: PROM;10 reps Internal Rotation: PROM;10 reps Flexion: PROM;10 reps ABduction: PROM;10 reps Seated Elevation: AROM;15 reps Extension: AROM;15 reps Row: AROM;15 reps Therapy Ball Flexion:  (12 reps) ABduction:  (12 reps) ROM / Strengthening / Isometric Strengthening Prot/Ret//Elev/Dep: 1 min Flexion: 5X5" Extension: 5X5" External Rotation: 5X5" Internal Rotation: 5X5" ABduction: 5X5" ADduction: 5X5" (elbow flexion and extension 5x5")  Manual Therapy Manual Therapy: Myofascial release Myofascial Release: Myofascial release (MFR) and manual stretching to Right bicep, upper arm, upper trap and scapular regions to decrease fascial restrictions and promote decreased pain with movement.    Occupational Therapy Assessment and Plan OT Assessment and Plan Clinical Impression Statement: continuing PROM and isometrics this session.  Pt had less fascial restrictions than in previous session.  discussed importance of balance between keeping arm stretched and moving versus doing too much. Pt verbalized understanding.  Added pro/ret/elev/dep this session, and pt tolerated well. OT Plan: continue MFR and manual stretching.  Increase isometrics to 10 second holds.  Re-eval prior to MD appointment on October 6th.    Goals Short Term Goals Short Term Goal 1: Patient will be educated on HEP.  Short Term Goal 1 Progress: Progressing toward goal Short Term Goal 2: Patient will decrease pain to 4/10 or less during daily tasks.  Short Term Goal 2 Progress: Progressing toward goal Short Term Goal 3: Patient will decrease fascial restrictions from mod to min amount.  Short Term Goal 3 Progress: Progressing toward goal Short Term Goal 4: Patient will increase AROM to [facility] to increase ability to complete daily tasks above shoulder level.  Short Term Goal 4 Progress: Progressing toward goal Short Term Goal 5: Patient will increase strength to 3/5 to increase ability to use right arm during work tasks, including reaching and carrying objects.  Short Term Goal 5 Progress: Progressing toward goal Long Term Goals Long Term Goal 1: Patient will return to highest level of independence during B/IADL tasks.  Long Term Goal 1 Progress: Progressing toward goal Long Term Goal 2: Patient will decrease pain to 2/10 or less to increase ability to sleep at night.  Long Term Goal 2 Progress: Progressing toward goal Long Term Goal 3: Patient will decrease fascial restrictions to trace amount or less.  Long Term Goal 3 Progress: Progressing toward goal Long Term Goal 4: Patient will increase AROM to WNL to increase ability to complete daily and work tasks above shoulder level.  Long Term Goal 4 Progress: Progressing toward goal Long Term Goal 5: Patient will increase strength to 5/5 to increase ability to use right arm during work tasks, including reaching, lifting, and carrying heavy objects.  Long Term Goal 5 Progress: Progressing toward goal  Problem List Patient Active Problem List   Diagnosis Date Noted  . S/P rotator cuff repair 02/10/2014  . Pain in joint, shoulder region 02/10/2014  . Muscle weakness (  generalized) 02/10/2014  . Decreased range of motion of right shoulder 02/10/2014    End of Session Activity  Tolerance: Patient tolerated treatment well General Behavior During Therapy: [facility] for tasks assessed/performed  GO    Bea Graff Joffrey Kerce, MS, OTR/L [phone] 02/25/2014, 12:00 PM

## 2014-02-27 ENCOUNTER — Ambulatory Visit (HOSPITAL_COMMUNITY)
Admission: RE | Admit: 2014-02-27 | Discharge: 2014-02-27 | Disposition: A | Discharge status: Home / Self Care | Payer: PRIVATE HEALTH INSURANCE | Source: Ambulatory Visit | Attending: Orthopedic Surgery | Admitting: Orthopedic Surgery

## 2014-02-27 ENCOUNTER — Inpatient Hospital Stay (HOSPITAL_COMMUNITY): Admission: RE | Admit: 2014-02-27 | Payer: PRIVATE HEALTH INSURANCE | Source: Ambulatory Visit

## 2014-02-27 DIAGNOSIS — M6281 Muscle weakness (generalized): Secondary | ICD-10-CM | POA: Insufficient documentation

## 2014-02-27 DIAGNOSIS — M25511 Pain in right shoulder: Secondary | ICD-10-CM | POA: Diagnosis not present

## 2014-02-27 DIAGNOSIS — Z5189 Encounter for other specified aftercare: Secondary | ICD-10-CM | POA: Insufficient documentation

## 2014-02-27 NOTE — Progress Notes (Signed)
Occupational Therapy Treatment Patient Details  Name: Marcus Chen MRN: [id number] Date of Birth: 23-Dec-1965  Today's Date: 02/27/2014 Time: 1696-7893 OT Time Calculation (min): 53 min Manual 8101-7510 (81') Therapeutic Exercises 2585-2778 (65')  Visit#: 8 of 24  Re-eval: 03/10/14    Authorization:    Authorization Time Period:    Authorization Visit#:   of    Subjective Symptoms/Limitations Symptoms: "I've figured out whats causing it. Sitting with my arm on the armsrest and leaning on it." Pain Assessment Currently in Pain?: Yes Pain Score: 1  Pain Location: Shoulder Pain Orientation: Right Pain Type: Acute pain  Exercise/Treatments Supine Protraction: PROM;10 reps Horizontal ABduction: PROM;10 reps External Rotation: PROM;10 reps Internal Rotation: PROM;10 reps Flexion: PROM;10 reps ABduction: PROM;10 reps Therapy Ball Flexion:  (12 reps) ABduction:  (12 reps) ROM / Strengthening / Isometric Strengthening   Flexion: 5X10" Extension: 5X10" External Rotation: 5X10" Internal Rotation: 5X10" ABduction: 5X10" ADduction: 5X10" (elbow flexion and extension 5x10")    Manual Therapy Manual Therapy: Myofascial release Myofascial Release: Myofascial release (MFR) and manual stretching to Right bicep, upper arm, upper trap and scapular regions to decrease fascial restrictions and promote decreased pain with movement.     Occupational Therapy Assessment and Plan OT Assessment and Plan Clinical Impression Statement: continued PROM and MR this session. Increased isometrics to 10 second hold. Pt tolerated well, with some discomfort in abduction, flexion, and ER/IR.   OT Plan: Re-eval for MD appointment.Ask MD about progressing to [facility].   Goals Short Term Goals Short Term Goal 1: Patient will be educated on HEP.  Short Term Goal 1 Progress: Progressing toward goal Short Term Goal 2: Patient will decrease pain to 4/10 or less during daily tasks.  Short Term Goal 2  Progress: Progressing toward goal Short Term Goal 3: Patient will decrease fascial restrictions from mod to min amount.  Short Term Goal 3 Progress: Progressing toward goal Short Term Goal 4: Patient will increase AROM to [facility] to increase ability to complete daily tasks above shoulder level.  Short Term Goal 4 Progress: Progressing toward goal Short Term Goal 5: Patient will increase strength to 3/5 to increase ability to use right arm during work tasks, including reaching and carrying objects.  Short Term Goal 5 Progress: Progressing toward goal Long Term Goals Long Term Goal 1: Patient will return to highest level of independence during B/IADL tasks.  Long Term Goal 1 Progress: Progressing toward goal Long Term Goal 2: Patient will decrease pain to 2/10 or less to increase ability to sleep at night.  Long Term Goal 2 Progress: Progressing toward goal Long Term Goal 3: Patient will decrease fascial restrictions to trace amount or less.  Long Term Goal 3 Progress: Progressing toward goal Long Term Goal 4: Patient will increase AROM to WNL to increase ability to complete daily and work tasks above shoulder level.  Long Term Goal 4 Progress: Progressing toward goal Long Term Goal 5: Patient will increase strength to 5/5 to increase ability to use right arm during work tasks, including reaching, lifting, and carrying heavy objects.  Long Term Goal 5 Progress: Progressing toward goal  Problem List Patient Active Problem List   Diagnosis Date Noted  . S/P rotator cuff repair 02/10/2014  . Pain in joint, shoulder region 02/10/2014  . Muscle weakness (generalized) 02/10/2014  . Decreased range of motion of right shoulder 02/10/2014    End of Session Activity Tolerance: Patient tolerated treatment well General Behavior During Therapy: [facility] for tasks assessed/performed  GO  Bea Graff [name], MS, OTR/L [place] [phone] 02/27/2014, 4:15 PM

## 2014-03-02 ENCOUNTER — Ambulatory Visit (HOSPITAL_COMMUNITY)
Admission: RE | Admit: 2014-03-02 | Discharge: 2014-03-02 | Disposition: A | Discharge status: Home / Self Care | Payer: PRIVATE HEALTH INSURANCE | Source: Ambulatory Visit

## 2014-03-02 DIAGNOSIS — Z5189 Encounter for other specified aftercare: Secondary | ICD-10-CM | POA: Diagnosis not present

## 2014-03-02 NOTE — Evaluation (Signed)
Note reviewed by clinical instructor and accurately reflects treatment session.  Garen Woolbright, OTR/L,CBIS   

## 2014-03-02 NOTE — Evaluation (Signed)
Note reviewed by clinical instructor and accurately reflects treatment session.  Jerrie Gullo, OTR/L,CBIS   

## 2014-03-02 NOTE — Evaluation (Addendum)
Occupational Therapy Reassessment  Patient Details  Name: Marcus Chen MRN: [id number] Date of Birth: 1966-01-05  Today's Date: 03/02/2014 Time: 3419-6222 OT Time Calculation (min): 40 min MFR: 9798-9211 20' Reassess: 9417-4081 20'  Visit#: 9 of 24  Re-eval: 03/10/14     Past Medical History:  Past Medical History  Diagnosis Date  . Kidney stone   . Gout    Past Surgical History:  Past Surgical History  Procedure Laterality Date  . Appendectomy      Subjective Symptoms/Limitations Symptoms: S: My arm is tight as a tick today, I don't know how much you're going to get out of it.  Pain Assessment Currently in Pain?: Yes Pain Score: 2  Pain Location: Shoulder Pain Orientation: Right Pain Type: Acute pain  Precautions/Restrictions  Precautions Type of Shoulder Precautions: PROM for 4 weeks (03/10/14). Progress to AAROM, AROM, and strengthening as tolerated.     Assessment   Additional Assessments RUE Assessment RUE Assessment:  (AROM assessed supine and seated. PROM assessed supine. ) RUE AROM (degrees) Right Shoulder Flexion: 180 Degrees (180 (supine)) Right Shoulder ABduction: 180 Degrees (180 (supine)) Right Shoulder Internal Rotation: 90 Degrees (90 (supine)) Right Shoulder External Rotation: 69 Degrees (64 (supine)) RUE PROM (degrees) Right Shoulder Flexion: 180 Degrees (140 on eval) Right Shoulder ABduction: 180 Degrees (84 on eval) Right Shoulder Internal Rotation: 90 Degrees Right Shoulder External Rotation: 68 Degrees (53 on eval)     Exercise/Treatments Supine Protraction: PROM;5 reps Horizontal ABduction: PROM;5 reps External Rotation: PROM;5 reps Internal Rotation: PROM;5 reps Flexion: PROM;5 reps ABduction: PROM;5 reps          Manual Therapy Manual Therapy: Myofascial release Myofascial Release: Myofascial release (MFR) and manual stretching to Right bicep, upper arm, upper trap and scapular regions to decrease fascial restrictions  and promote decreased pain with movement.  Occupational Therapy Assessment and Plan OT Assessment and Plan Clinical Impression Statement: A: Reassessment completed this date. Patient has met 3/5 short-term goals and 1/5 long-term goals. Patient has improved PROM and AROM flexion, abduction, and IR to WNL. Patient continues to have min/mod amount of fascial restrictions in left upper arm and bicep regions. Patient reports pain during the day and at night, mainly in the bicep, continuing to affect his sleep. Patient is using his arm during light work tasks.  OT Plan: P: Follow-up on doctor's appointment. Begin AAROM/AROM exercises if appropriate.    Goals Short Term Goals Short Term Goal 1: Patient will be educated on HEP.  Short Term Goal 1 Progress: Met Short Term Goal 2: Patient will decrease pain to 4/10 or less during daily tasks.  Short Term Goal 2 Progress: Progressing toward goal Short Term Goal 3: Patient will decrease fascial restrictions from mod to min amount.  Short Term Goal 3 Progress: Progressing toward goal Short Term Goal 4: Patient will increase AROM to [facility] to increase ability to complete daily tasks above shoulder level.  Short Term Goal 4 Progress: Met Short Term Goal 5: Patient will increase strength to 3/5 to increase ability to use right arm during work tasks, including reaching and carrying objects.  Short Term Goal 5 Progress: Met Long Term Goals Long Term Goal 1: Patient will return to highest level of independence during B/IADL tasks.  Long Term Goal 1 Progress: Progressing toward goal Long Term Goal 2: Patient will decrease pain to 2/10 or less to increase ability to sleep at night.  Long Term Goal 2 Progress: Progressing toward goal Long Term Goal 3: Patient  will decrease fascial restrictions to trace amount or less.  Long Term Goal 3 Progress: Progressing toward goal Long Term Goal 4: Patient will increase AROM to WNL to increase ability to complete daily and  work tasks above shoulder level.  Long Term Goal 4 Progress: Met Long Term Goal 5: Patient will increase strength to 5/5 to increase ability to use right arm during work tasks, including reaching, lifting, and carrying heavy objects.  Long Term Goal 5 Progress: Progressing toward goal  Problem List Patient Active Problem List   Diagnosis Date Noted  . S/P rotator cuff repair 02/10/2014  . Pain in joint, shoulder region 02/10/2014  . Muscle weakness (generalized) 02/10/2014  . Decreased range of motion of right shoulder 02/10/2014    End of Session Activity Tolerance: Patient tolerated treatment well General Behavior During Therapy: [facility] for tasks assessed/performed   [name]. OT Student  03/02/2014, 12:17 PM  Physician Documentation Your signature is required to indicate approval of the treatment plan as stated above.  Please sign and either send electronically or make a copy of this report for your files and return this physician signed original.  Please mark one 1.__approve of plan  2. ___approve of plan with the following conditions.   ______________________________                                                          _____________________ Physician Signature                                                                                                             Date

## 2014-03-04 ENCOUNTER — Ambulatory Visit (HOSPITAL_COMMUNITY)
Admission: RE | Admit: 2014-03-04 | Discharge: 2014-03-04 | Disposition: A | Discharge status: Home / Self Care | Payer: PRIVATE HEALTH INSURANCE | Source: Ambulatory Visit

## 2014-03-04 DIAGNOSIS — Z5189 Encounter for other specified aftercare: Secondary | ICD-10-CM | POA: Diagnosis not present

## 2014-03-04 NOTE — Progress Notes (Signed)
Occupational Therapy Treatment Patient Details  Name: Marcus Chen MRN: [id number] Date of Birth: 1965/11/07  Today's Date: 03/04/2014 Time: 6195-0932 OT Time Calculation (min): 35 min MFR: 6712-4580 13' Therex: 9983-3825 22'  Visit#: 10 of 24  Re-eval: 03/30/14   Subjective Symptoms/Limitations Symptoms: S: I went to the doctor and he said gave me a good report. He said we could go ahead and do what I can handle.  Pain Assessment Currently in Pain?: Yes Pain Score: 2  Pain Location: Shoulder Pain Orientation: Right Pain Type: Acute pain  Precautions/Restrictions  Precautions Precautions: Shoulder Type of Shoulder Precautions: PROM for 4 weeks (03/10/14). Progress to AAROM, AROM, and strengthening as tolerated.   Exercise/Treatments Supine Protraction: PROM;5 reps;AAROM;10 reps Horizontal ABduction: PROM;5 reps;AAROM;10 reps External Rotation: PROM;5 reps;AAROM;10 reps Internal Rotation: PROM;5 reps;AAROM;10 reps Flexion: PROM;5 reps;AAROM;10 reps ABduction: PROM;5 reps;AAROM;10 reps  Standing Protraction: AAROM;10 reps Horizontal ABduction: AAROM;10 reps External Rotation: AAROM;10 reps Internal Rotation: AAROM;10 reps Flexion: AAROM;10 reps ABduction: AAROM;10 reps  Pulleys Flexion: 1 minute ABduction: 1 minute      Manual Therapy Manual Therapy: Myofascial release Myofascial Release: Myofascial release (MFR) and manual stretching to Right bicep, upper arm, upper trap and scapular regions to decrease fascial restrictions and promote decreased pain with movement.  Occupational Therapy Assessment and Plan OT Assessment and Plan Clinical Impression Statement: A: Began AAROM exercises in supine and standing. Added pulleys, flexion and abduction. Patient tolerated treatment well. Patient reports continued difficulty sleeping and painful spasms in his bicep. Patient reports he is using his arm during short tasks such as changing light bulbs and light work tasks.  Note: Patient is continuing to take prescription medication three times per day and occasional additional pain medicine. Not using ice for pain currently.   OT Plan: P: Follow up on soreness from Wed. Add scapular theraband exercises. Provide AAROM HEP.    Goals Short Term Goals Short Term Goal 1: Patient will be educated on HEP.  Short Term Goal 2: Patient will decrease pain to 4/10 or less during daily tasks.  Short Term Goal 3: Patient will decrease fascial restrictions from mod to min amount.  Short Term Goal 4: Patient will increase AROM to [facility] to increase ability to complete daily tasks above shoulder level.  Short Term Goal 5: Patient will increase strength to 3/5 to increase ability to use right arm during work tasks, including reaching and carrying objects.  Long Term Goals Long Term Goal 1: Patient will return to highest level of independence during B/IADL tasks.  Long Term Goal 2: Patient will decrease pain to 2/10 or less to increase ability to sleep at night.  Long Term Goal 3: Patient will decrease fascial restrictions to trace amount or less.  Long Term Goal 4: Patient will increase AROM to WNL to increase ability to complete daily and work tasks above shoulder level.  Long Term Goal 5: Patient will increase strength to 5/5 to increase ability to use right arm during work tasks, including reaching, lifting, and carrying heavy objects.   Problem List Patient Active Problem List   Diagnosis Date Noted  . S/P rotator cuff repair 02/10/2014  . Pain in joint, shoulder region 02/10/2014  . Muscle weakness (generalized) 02/10/2014  . Decreased range of motion of right shoulder 02/10/2014    End of Session Activity Tolerance: Patient tolerated treatment well General Behavior During Therapy: [facility] for tasks assessed/performed  [name]. OT Student  03/04/2014, 11:59 AM

## 2014-03-04 NOTE — Progress Notes (Signed)
Note reviewed by clinical instructor and accurately reflects treatment session.  Jarmaine Ehrler, OTR/L,CBIS   

## 2014-03-06 ENCOUNTER — Ambulatory Visit (HOSPITAL_COMMUNITY)
Admission: RE | Admit: 2014-03-06 | Discharge: 2014-03-06 | Disposition: A | Discharge status: Home / Self Care | Payer: PRIVATE HEALTH INSURANCE | Source: Ambulatory Visit | Attending: Family Medicine | Admitting: Family Medicine

## 2014-03-06 DIAGNOSIS — Z5189 Encounter for other specified aftercare: Secondary | ICD-10-CM | POA: Diagnosis not present

## 2014-03-06 NOTE — Progress Notes (Signed)
Occupational Therapy Treatment Patient Details  Name: Marcus Chen MRN: [id number] Date of Birth: 1965-06-14  Today's Date: 03/06/2014 Time: 0092-3300 OT Time Calculation (min): 39 min MFR: 7622-6333 21' Therex: 5456-2563 18'   Visit#: 11 of 24  Re-eval: 03/30/14   Subjective Symptoms/Limitations Symptoms: S: It's tight, it's sore, it hurts, it's just aggravating.  Pain Assessment Currently in Pain?: Yes Pain Score: 2  Pain Location: Shoulder Pain Orientation: Right Pain Type: Acute pain  Precautions/Restrictions  Precautions Precautions: Shoulder Type of Shoulder Precautions: PROM for 4 weeks (03/10/14). Progress to AAROM, AROM, and strengthening as tolerated.   Exercise/Treatments Supine Protraction: PROM;5 reps;AAROM;10 reps Horizontal ABduction: PROM;5 reps;AAROM;10 reps External Rotation: PROM;5 reps;AAROM;10 reps Internal Rotation: PROM;5 reps;AAROM;10 reps Flexion: PROM;5 reps;AAROM;10 reps ABduction: PROM;5 reps;AAROM;10 reps   Standing Protraction: AAROM;10 reps Horizontal ABduction: AAROM;10 reps External Rotation: AAROM;10 reps Internal Rotation: AAROM;10 reps Flexion: AAROM;10 reps ABduction: AAROM;10 reps Extension: Theraband;10 reps Theraband Level (Shoulder Extension): Level 2 (Red) Row: Theraband;10 reps Theraband Level (Shoulder Row): Level 2 (Red) Retraction: Theraband;10 reps Theraband Level (Shoulder Retraction): Level 2 (Red)      Manual Therapy Manual Therapy: Myofascial release Myofascial Release: Myofascial release (MFR) and manual stretching to Right bicep, upper arm, upper trap and scapular regions to decrease fascial restrictions and promote decreased pain with movement.  Occupational Therapy Assessment and Plan OT Assessment and Plan Clinical Impression Statement: A: Added scapular theraband exercises. Focused myofascial release/manual therapy on upper trapezius region. Patient tolerated treatment well. Patient continues to report  painful spasms in his bicep, and continues to use his arm during light work tasks. Patient reports he uses heat on his arm regularly.  OT Plan: P: Increase AAROM repetitions to 12. Continue to focus on upper trapezius during manual therapy. Provide AAROM HEP. Encourage ice for pain management.    Goals Short Term Goals Short Term Goal 1: Patient will be educated on HEP.  Short Term Goal 2: Patient will decrease pain to 4/10 or less during daily tasks.  Short Term Goal 3: Patient will decrease fascial restrictions from mod to min amount.  Short Term Goal 4: Patient will increase AROM to [facility] to increase ability to complete daily tasks above shoulder level.  Short Term Goal 5: Patient will increase strength to 3/5 to increase ability to use right arm during work tasks, including reaching and carrying objects.  Long Term Goals Long Term Goal 1: Patient will return to highest level of independence during B/IADL tasks.  Long Term Goal 2: Patient will decrease pain to 2/10 or less to increase ability to sleep at night.  Long Term Goal 3: Patient will decrease fascial restrictions to trace amount or less.  Long Term Goal 4: Patient will increase AROM to WNL to increase ability to complete daily and work tasks above shoulder level.  Long Term Goal 5: Patient will increase strength to 5/5 to increase ability to use right arm during work tasks, including reaching, lifting, and carrying heavy objects.   Problem List Patient Active Problem List   Diagnosis Date Noted  . S/P rotator cuff repair 02/10/2014  . Pain in joint, shoulder region 02/10/2014  . Muscle weakness (generalized) 02/10/2014  . Decreased range of motion of right shoulder 02/10/2014    End of Session Activity Tolerance: Patient tolerated treatment well General Behavior During Therapy: [facility] for tasks assessed/performed  [name]. OT Student  03/06/2014, 4:43 PM

## 2014-03-06 NOTE — Progress Notes (Signed)
Note reviewed by clinical instructor and accurately reflects treatment session.  Fraidy Mccarrick, OTR/L,CBIS   

## 2014-03-09 ENCOUNTER — Ambulatory Visit (HOSPITAL_COMMUNITY)
Admission: RE | Admit: 2014-03-09 | Discharge: 2014-03-09 | Disposition: A | Discharge status: Home / Self Care | Payer: PRIVATE HEALTH INSURANCE | Source: Ambulatory Visit

## 2014-03-09 DIAGNOSIS — Z5189 Encounter for other specified aftercare: Secondary | ICD-10-CM | POA: Diagnosis not present

## 2014-03-09 NOTE — Progress Notes (Signed)
Occupational Therapy Treatment Patient Details  Name: Marcus Chen MRN: [id number] Date of Birth: 11-10-1965  Today's Date: 03/09/2014 Time: 7741-2878 OT Time Calculation (min): 37 min MFR: 6767-2094 11' Therex: 7096-2836 26'  Visit#: 12 of 24  Re-eval: 03/30/14    Subjective Symptoms/Limitations Symptoms: S: I've been doing as much as I can with it.  Pain Assessment Currently in Pain?: Yes Pain Score: 1  Pain Location: Shoulder Pain Orientation: Right Pain Type: Acute pain  Precautions/Restrictions  Precautions Precautions: Shoulder Type of Shoulder Precautions: PROM for 4 weeks (03/10/14). Progress to AAROM, AROM, and strengthening as tolerated.   Exercise/Treatments Supine Protraction: PROM;5 reps;AAROM;15 reps Horizontal ABduction: PROM;5 reps;AAROM;15 reps External Rotation: PROM;5 reps;AAROM;15 reps Internal Rotation: PROM;5 reps;AAROM;15 reps Flexion: PROM;5 reps;AAROM;15 reps ABduction: PROM;5 reps;AAROM;15 reps   Standing Protraction: AAROM;15 reps Horizontal ABduction: AAROM;15 reps External Rotation: AAROM;15 reps Internal Rotation: AAROM;15 reps Flexion: AAROM;15 reps ABduction: AAROM;15 reps Extension: Theraband;15 reps Theraband Level (Shoulder Extension): Level 2 (Red) Row: Theraband;15 reps Theraband Level (Shoulder Row): Level 2 (Red) Retraction: Theraband;15 reps Theraband Level (Shoulder Retraction): Level 2 (Red)   ROM / Strengthening / Isometric Strengthening Wall Wash: 1' Prot/Ret//Elev/Dep: 1'        Manual Therapy Manual Therapy: Myofascial release Myofascial Release: Myofascial release (MFR) and manual stretching to Right bicep, upper arm, upper trap and scapular regions to decrease fascial restrictions and promote decreased pain with movement  Occupational Therapy Assessment and Plan OT Assessment and Plan Clinical Impression Statement: A: Increased AAROM, and scapular theraband exercises to 15 repetitions. Added wall wash.  Patient tolerated treatment well. Patient reports feeling better in upper trapezius region after previous session.  Patient also reports he feels better in general today, as he has been sleeping slighty better.  The doctor prescribed a topical NSAID that he believes is helping.  OT Plan: P: Add thumb tacks. Continue to encourage ice for pain management.    Goals Short Term Goals Short Term Goal 1: Patient will be educated on HEP.  Short Term Goal 2: Patient will decrease pain to 4/10 or less during daily tasks.  Short Term Goal 2 Progress: Progressing toward goal Short Term Goal 3: Patient will decrease fascial restrictions from mod to min amount.  Short Term Goal 3 Progress: Progressing toward goal Short Term Goal 4: Patient will increase AROM to [facility] to increase ability to complete daily tasks above shoulder level.  Short Term Goal 5: Patient will increase strength to 3/5 to increase ability to use right arm during work tasks, including reaching and carrying objects.  Long Term Goals Long Term Goal 1: Patient will return to highest level of independence during B/IADL tasks.  Long Term Goal 1 Progress: Progressing toward goal Long Term Goal 2: Patient will decrease pain to 2/10 or less to increase ability to sleep at night.  Long Term Goal 2 Progress: Progressing toward goal Long Term Goal 3: Patient will decrease fascial restrictions to trace amount or less.  Long Term Goal 3 Progress: Progressing toward goal Long Term Goal 4: Patient will increase AROM to WNL to increase ability to complete daily and work tasks above shoulder level.  Long Term Goal 5: Patient will increase strength to 5/5 to increase ability to use right arm during work tasks, including reaching, lifting, and carrying heavy objects.  Long Term Goal 5 Progress: Progressing toward goal  Problem List Patient Active Problem List   Diagnosis Date Noted  . S/P rotator cuff repair 02/10/2014  . Pain in joint,  shoulder region  02/10/2014  . Muscle weakness (generalized) 02/10/2014  . Decreased range of motion of right shoulder 02/10/2014    End of Session Activity Tolerance: Patient tolerated treatment well General Behavior During Therapy: [facility] for tasks assessed/performed OT Plan of Care OT Home Exercise Plan: AAROM exercises OT Patient Instructions: handout (scanned) Consulted and Agree with Plan of Care: Patient   [name]. OT Student  03/09/2014, 11:59 AM

## 2014-03-09 NOTE — Progress Notes (Signed)
Note reviewed by clinical instructor and accurately reflects treatment session.  Jorden Minchey, OTR/L,CBIS   

## 2014-03-11 ENCOUNTER — Ambulatory Visit (HOSPITAL_COMMUNITY)
Admission: RE | Admit: 2014-03-11 | Discharge: 2014-03-11 | Disposition: A | Discharge status: Home / Self Care | Payer: PRIVATE HEALTH INSURANCE | Source: Ambulatory Visit | Attending: Family Medicine | Admitting: Family Medicine

## 2014-03-11 DIAGNOSIS — Z5189 Encounter for other specified aftercare: Secondary | ICD-10-CM | POA: Diagnosis not present

## 2014-03-11 NOTE — Progress Notes (Signed)
Note reviewed by clinical instructor and accurately reflects treatment session.  Hyla Coard, OTR/L,CBIS   

## 2014-03-11 NOTE — Progress Notes (Signed)
Occupational Therapy Treatment Patient Details  Name: Marcus Chen MRN: [id number] Date of Birth: 10/02/1965  Today's Date: 03/11/2014 Time: 3716-9678 OT Time Calculation (min): 40 min MFR: 9381-0175 14' Therex: 1025-8527 26'  Visit#: 13 of 24  Re-eval: 03/30/14   Subjective Symptoms/Limitations Symptoms: S: It's feeling pretty good, I'm sleeping better and notice I'm taking less pain medication.  Pain Assessment Currently in Pain?: No/denies  Precautions/Restrictions  Precautions Precautions: Shoulder Type of Shoulder Precautions: PROM for 4 weeks (03/10/14). Progress to AAROM, AROM, and strengthening as tolerated.   Exercise/Treatments Supine Protraction: PROM;5 reps;AAROM;15 reps Horizontal ABduction: PROM;5 reps;AAROM;15 reps External Rotation: PROM;5 reps;AAROM;15 reps Internal Rotation: PROM;5 reps;AAROM;15 reps Flexion: PROM;5 reps;AAROM;15 reps ABduction: PROM;5 reps;AAROM;15 reps  Standing Protraction: AAROM;15 reps Horizontal ABduction: AAROM;15 reps External Rotation: AAROM;15 reps Internal Rotation: AAROM;15 reps Flexion: AAROM;15 reps ABduction: AAROM;15 reps   ROM / Strengthening / Isometric Strengthening Wall Wash: 1' Thumb Tacks: 1' Prot/Ret//Elev/Dep: 1'          Occupational Therapy Assessment and Plan OT Assessment and Plan Clinical Impression Statement: A: Added thumb tacks. Patient tolerated treatment well. Patient reports he is taking less pain medication and is sleeping better. He continues to have sharp, painful spasms in his bicep. Patient reports he used his right arm when washing his truck yesterday and that it went well and he has not had any increased soreness or pain.  OT Plan: P: Begin AROM exercises. Attempt proximal shoulder strengthening in supine.    Goals Short Term Goals Short Term Goal 1: Patient will be educated on HEP.  Short Term Goal 2: Patient will decrease pain to 4/10 or less during daily tasks.  Short Term Goal  3: Patient will decrease fascial restrictions from mod to min amount.  Short Term Goal 4: Patient will increase AROM to [facility] to increase ability to complete daily tasks above shoulder level.  Short Term Goal 5: Patient will increase strength to 3/5 to increase ability to use right arm during work tasks, including reaching and carrying objects.  Long Term Goals Long Term Goal 1: Patient will return to highest level of independence during B/IADL tasks.  Long Term Goal 2: Patient will decrease pain to 2/10 or less to increase ability to sleep at night.  Long Term Goal 3: Patient will decrease fascial restrictions to trace amount or less.  Long Term Goal 4: Patient will increase AROM to WNL to increase ability to complete daily and work tasks above shoulder level.  Long Term Goal 5: Patient will increase strength to 5/5 to increase ability to use right arm during work tasks, including reaching, lifting, and carrying heavy objects.   Problem List Patient Active Problem List   Diagnosis Date Noted  . S/P rotator cuff repair 02/10/2014  . Pain in joint, shoulder region 02/10/2014  . Muscle weakness (generalized) 02/10/2014  . Decreased range of motion of right shoulder 02/10/2014    End of Session Activity Tolerance: Patient tolerated treatment well General Behavior During Therapy: [facility] for tasks assessed/performed   [name]. OT Student  03/11/2014, 12:01 PM

## 2014-03-13 ENCOUNTER — Ambulatory Visit (HOSPITAL_COMMUNITY)
Admission: RE | Admit: 2014-03-13 | Discharge: 2014-03-13 | Disposition: A | Discharge status: Home / Self Care | Payer: PRIVATE HEALTH INSURANCE | Source: Ambulatory Visit | Attending: Orthopedic Surgery | Admitting: Orthopedic Surgery

## 2014-03-13 DIAGNOSIS — Z5189 Encounter for other specified aftercare: Secondary | ICD-10-CM | POA: Diagnosis not present

## 2014-03-13 NOTE — Progress Notes (Signed)
Occupational Therapy Treatment Patient Details  Name: Marcus Chen MRN: [id number] Date of Birth: 1965-09-02  Today's Date: 03/13/2014 Time: 7416-3845 OT Time Calculation (min): 46 min Manual 1304-1318 (14') Therapeutic Exercises 3646-8032 (74')  Visit#: 14 of 24  Re-eval: 03/30/14    Authorization:    Authorization Time Period:    Authorization Visit#:   of    Subjective Symptoms/Limitations Symptoms: "Actually, I'm at a 0.  I probably ahve been for a week now.  I still have catches.  I swung a hammer for most of the day yesterday, so I'm sore." Pain Assessment Currently in Pain?: No/denies  Exercise/Treatments Supine Protraction: PROM;5 reps;AROM;10 reps Horizontal ABduction: PROM;5 reps;AROM;10 reps External Rotation: PROM;5 reps;AROM;10 reps Internal Rotation: PROM;5 reps;AROM;10 reps Flexion: PROM;5 reps;AROM;10 reps (one rest break) ABduction: PROM;5 reps;AROM;10 reps (one rest break) Standing Protraction: AAROM;10 reps;AROM;5 reps Horizontal ABduction: AAROM;10 reps;AROM;5 reps External Rotation: AAROM;10 reps;AROM;5 reps Internal Rotation: AAROM;10 reps;AROM;5 reps Flexion: AAROM;10 reps;AROM;5 reps ABduction: AAROM;10 reps;AROM;5 reps ROM / Strengthening / Isometric Strengthening Proximal Shoulder Strengthening, Supine: 10x (x2 instances)   Manual Therapy Manual Therapy: Myofascial release Myofascial Release: Myofascial release (MFR) and manual stretching to Right bicep, upper arm, upper trap and scapular regions to decrease fascial restrictions and promote decreased pain with movement.    Occupational Therapy Assessment and Plan OT Assessment and Plan Clinical Impression Statement: Pt with increased soreness this session due to using hammer with R hand yesterday during home improvement project.  Added AROm in supine this session.  Pt had increased pain and required rest breaks, but was able to complete.  Pt tolerated well supine proximal shoulder  strengthening with min diffiuclty.  Added few reps AROM in standing - pt had increased pain, but was able to complete.  Pt continue to have difficulty with ER - educate pt on stargazer stretch and wall stretch for ER.   OT Plan: Follow up on ER stretches (stargazer and wall stretch).  continue AROM exercises and progress as tolerated. Attempt standing proximal shoulder strengthening.   Goals Short Term Goals Short Term Goal 1: Patient will be educated on HEP.  Short Term Goal 1 Progress: Met Short Term Goal 2: Patient will decrease pain to 4/10 or less during daily tasks.  Short Term Goal 2 Progress: Progressing toward goal Short Term Goal 3: Patient will decrease fascial restrictions from mod to min amount.  Short Term Goal 3 Progress: Progressing toward goal Short Term Goal 4: Patient will increase AROM to [facility] to increase ability to complete daily tasks above shoulder level.  Short Term Goal 4 Progress: Met Short Term Goal 5: Patient will increase strength to 3/5 to increase ability to use right arm during work tasks, including reaching and carrying objects.  Short Term Goal 5 Progress: Met Long Term Goals Long Term Goal 1: Patient will return to highest level of independence during B/IADL tasks.  Long Term Goal 1 Progress: Progressing toward goal Long Term Goal 2: Patient will decrease pain to 2/10 or less to increase ability to sleep at night.  Long Term Goal 2 Progress: Progressing toward goal Long Term Goal 3: Patient will decrease fascial restrictions to trace amount or less.  Long Term Goal 3 Progress: Progressing toward goal Long Term Goal 4: Patient will increase AROM to WNL to increase ability to complete daily and work tasks above shoulder level.  Long Term Goal 4 Progress: Met Long Term Goal 5: Patient will increase strength to 5/5 to increase ability to use right arm  during work tasks, including reaching, lifting, and carrying heavy objects.  Long Term Goal 5 Progress:  Progressing toward goal  Problem List Patient Active Problem List   Diagnosis Date Noted  . S/P rotator cuff repair 02/10/2014  . Pain in joint, shoulder region 02/10/2014  . Muscle weakness (generalized) 02/10/2014  . Decreased range of motion of right shoulder 02/10/2014    End of Session Activity Tolerance: Patient tolerated treatment well General Behavior During Therapy: [facility] for tasks assessed/performed  GO   Bea Graff Marlene Beidler, MS, OTR/L [place] 03/13/2014, 2:00 PM

## 2014-03-16 ENCOUNTER — Ambulatory Visit (HOSPITAL_COMMUNITY)
Admission: RE | Admit: 2014-03-16 | Discharge: 2014-03-16 | Disposition: A | Discharge status: Home / Self Care | Payer: PRIVATE HEALTH INSURANCE | Source: Ambulatory Visit

## 2014-03-16 DIAGNOSIS — Z5189 Encounter for other specified aftercare: Secondary | ICD-10-CM | POA: Diagnosis not present

## 2014-03-16 NOTE — Progress Notes (Signed)
Note reviewed by clinical instructor and accurately reflects treatment session.  Carmello Cabiness, OTR/L,CBIS   

## 2014-03-16 NOTE — Progress Notes (Signed)
Occupational Therapy Treatment Patient Details  Name: Marcus Chen MRN: [id number] Date of Birth: 03-05-1966  Today's Date: 03/16/2014 Time: 4270-6237 OT Time Calculation (min): 38 min MFR: 6283-1517 11' Therex: 6160-7371 27'  Visit#: 15 of 24  Re-eval: 03/30/14   Subjective Symptoms/Limitations Symptoms: S: I'm doing more, trying to use it more. 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  Pain Assessment Currently in Pain?: Yes Pain Score: 1  Pain Location: Shoulder Pain Orientation: Right Pain Type: Acute pain  Precautions/Restrictions  Precautions Precautions: Shoulder Type of Shoulder Precautions: PROM for 4 weeks (03/10/14). Progress to AAROM, AROM, and strengthening as tolerated.   Exercise/Treatments Supine Protraction: PROM;5 reps;AROM;12 reps Horizontal ABduction: PROM;5 reps;AROM;12 reps External Rotation: PROM;5 reps;AROM;12 reps Internal Rotation: PROM;5 reps;AROM;12 reps Flexion: PROM;5 reps;AROM;12 reps ABduction: PROM;5 reps;AROM;12 reps (1 rest break)   Standing Protraction: AROM;12 reps Horizontal ABduction: AROM;12 reps External Rotation: AROM;12 reps Internal Rotation: AROM;12 reps Flexion: AROM;12 reps ABduction: AROM;12 reps   ROM / Strengthening / Isometric Strengthening UBE (Upper Arm Bike): Level 1 3' forward 3' reverse Proximal Shoulder Strengthening, Supine: 10x Proximal Shoulder Strengthening, Seated: 10x        Manual Therapy Manual Therapy: Myofascial release Myofascial Release: Myofascial release (MFR) and manual stretching to Right bicep, upper arm, upper trap and scapular regions to decrease fascial restrictions and promote decreased pain with movement.  Occupational Therapy Assessment and Plan OT Assessment and Plan Clinical Impression Statement: A: Increased AROM repetitions to 12 in standing. Added proximal shoulder strengthening, UBE Level 1. Patient tolerated treatment well. Patient continues to report pain and spasms in  bicep region, and is trying to use his arm more during daily activities. Patient reports he is completing ER stretches at home.  OT Plan: P: Attempt x to v arms.  Resume wall wash, increase to 1'30"   Goals Short Term Goals Short Term Goal 1: Patient will be educated on HEP.  Short Term Goal 2: Patient will decrease pain to 4/10 or less during daily tasks.  Short Term Goal 2 Progress: Progressing toward goal Short Term Goal 3: Patient will decrease fascial restrictions from mod to min amount.  Short Term Goal 3 Progress: Progressing toward goal Short Term Goal 4: Patient will increase AROM to [facility] to increase ability to complete daily tasks above shoulder level.  Short Term Goal 5: Patient will increase strength to 3/5 to increase ability to use right arm during work tasks, including reaching and carrying objects.  Long Term Goals Long Term Goal 1: Patient will return to highest level of independence during B/IADL tasks.  Long Term Goal 1 Progress: Progressing toward goal Long Term Goal 2: Patient will decrease pain to 2/10 or less to increase ability to sleep at night.  Long Term Goal 2 Progress: Progressing toward goal Long Term Goal 3: Patient will decrease fascial restrictions to trace amount or less.  Long Term Goal 3 Progress: Progressing toward goal Long Term Goal 4: Patient will increase AROM to WNL to increase ability to complete daily and work tasks above shoulder level.  Long Term Goal 5: Patient will increase strength to 5/5 to increase ability to use right arm during work tasks, including reaching, lifting, and carrying heavy objects.  Long Term Goal 5 Progress: Progressing toward goal  Problem List Patient Active Problem List   Diagnosis Date Noted  . S/P rotator cuff repair 02/10/2014  . Pain in joint, shoulder region 02/10/2014  . Muscle weakness (generalized) 02/10/2014  . Decreased range of  motion of right shoulder 02/10/2014    End of Session Activity Tolerance:  Patient tolerated treatment well General Behavior During Therapy: [facility] for tasks assessed/performed   [name]. OT Student  03/16/2014, 11:50 AM

## 2014-03-18 ENCOUNTER — Ambulatory Visit (HOSPITAL_COMMUNITY)
Admission: RE | Admit: 2014-03-18 | Discharge: 2014-03-18 | Disposition: A | Discharge status: Home / Self Care | Payer: PRIVATE HEALTH INSURANCE | Source: Ambulatory Visit

## 2014-03-18 DIAGNOSIS — Z5189 Encounter for other specified aftercare: Secondary | ICD-10-CM | POA: Diagnosis not present

## 2014-03-18 NOTE — Progress Notes (Signed)
Occupational Therapy Treatment Patient Details  Name: MEMPHIS DECOTEAU MRN: [id number] Date of Birth: [birth year]  Today's Date: 03/18/2014 Time: 5456-2563 OT Time Calculation (min): 50 min Manual 8937-3428 (23') Therapeutic Exercises 1129-1156 (89')  Visit#: 16 of 24  Re-eval: 03/30/14    Authorization:    Authorization Time Period:    Authorization Visit#:   of    Subjective Symptoms/Limitations Symptoms: S: I actually did some heavy lifting yesterday, I was surprised - it actually felt really good! Pain Assessment Currently in Pain?: No/denies  Exercise/Treatments Supine Protraction: PROM;5 reps;AROM;15 reps Horizontal ABduction: PROM;5 reps;AROM;15 reps External Rotation: PROM;5 reps;AROM;15 reps Internal Rotation: PROM;5 reps;AROM;15 reps Flexion: PROM;5 reps;AROM;15 reps ABduction: PROM;5 reps;AROM;15 reps Standing Protraction: AROM;15 reps Horizontal ABduction: AROM;15 reps External Rotation: AROM;15 reps Internal Rotation: AROM;15 reps Flexion: AROM;15 reps ABduction: AROM;15 reps Other Standing Exercises: bicep curls - 3#, 15 reps, and 12 reps with 5#; with stretch into extension with each rep ROM / Strengthening / Isometric Strengthening Wall Wash: 1:30 X to V Arms: 12 reps AROM Proximal Shoulder Strengthening, Supine: 10x   Manual Therapy Manual Therapy: Myofascial release Myofascial Release: Myofascial release (MFR) and manual stretching to Right bicep, upper arm, upper trap and scapular regions to decrease fascial restrictions and promote decreased pain with movement.     Occupational Therapy Assessment and Plan OT Assessment and Plan Clinical Impression Statement: Pt reports being stronger now than prior to surgery, but contniued to have increased pain and tightness in bicep region.  Added bicep curls with 5# this session - pt had some pain with full extension stretch, but was able to tolerate reps.  Increased supine and standing AROM reps to 15, with some  fatigue and pain. Added x-v arms with good tolerance.  pt able to increase time on wall wash - encouraged pt to stretch into increased horizontal ab/adduction for improved stretch during exercise.    OT Plan: Continue 15 reps AROM. Add to HEP. Follow up on soreness from bicep curls.   Goals Short Term Goals Short Term Goal 1: Patient will be educated on HEP.  Short Term Goal 1 Progress: Met Short Term Goal 2: Patient will decrease pain to 4/10 or less during daily tasks.  Short Term Goal 2 Progress: Progressing toward goal Short Term Goal 3: Patient will decrease fascial restrictions from mod to min amount.  Short Term Goal 3 Progress: Progressing toward goal Short Term Goal 4: Patient will increase AROM to [facility] to increase ability to complete daily tasks above shoulder level.  Short Term Goal 4 Progress: Met Short Term Goal 5: Patient will increase strength to 3/5 to increase ability to use right arm during work tasks, including reaching and carrying objects.  Short Term Goal 5 Progress: Met Long Term Goals Long Term Goal 1: Patient will return to highest level of independence during B/IADL tasks.  Long Term Goal 1 Progress: Progressing toward goal Long Term Goal 2: Patient will decrease pain to 2/10 or less to increase ability to sleep at night.  Long Term Goal 2 Progress: Progressing toward goal Long Term Goal 3: Patient will decrease fascial restrictions to trace amount or less.  Long Term Goal 3 Progress: Progressing toward goal Long Term Goal 4: Patient will increase AROM to WNL to increase ability to complete daily and work tasks above shoulder level.  Long Term Goal 4 Progress: Met Long Term Goal 5: Patient will increase strength to 5/5 to increase ability to use right arm during work tasks, including reaching,  lifting, and carrying heavy objects.  Long Term Goal 5 Progress: Progressing toward goal  Problem List Patient Active Problem List   Diagnosis Date Noted  . S/P rotator  cuff repair 02/10/2014  . Pain in joint, shoulder region 02/10/2014  . Muscle weakness (generalized) 02/10/2014  . Decreased range of motion of right shoulder 02/10/2014    End of Session Activity Tolerance: Patient tolerated treatment well General Behavior During Therapy: [facility] for tasks assessed/performed  GO    Bea Graff Tene Gato, [place], OTR/L [place] 03/18/2014, 12:02 PM

## 2014-03-20 ENCOUNTER — Ambulatory Visit (HOSPITAL_COMMUNITY)
Admission: RE | Admit: 2014-03-20 | Discharge: 2014-03-20 | Disposition: A | Discharge status: Home / Self Care | Payer: PRIVATE HEALTH INSURANCE | Source: Ambulatory Visit | Attending: Family Medicine | Admitting: Family Medicine

## 2014-03-20 DIAGNOSIS — Z5189 Encounter for other specified aftercare: Secondary | ICD-10-CM | POA: Diagnosis not present

## 2014-03-20 NOTE — Progress Notes (Signed)
Occupational Therapy Treatment Patient Details  Name: Marcus Chen MRN: [id number] Date of Birth: [birth year]  Today's Date: 03/20/2014 Time: 1908-3449 OT Time Calculation (min): 48 min Manual 3284-6486 (30') Therapeutic Exercises 1337-1355 (18')  Visit#: 17 of 24  Re-eval: 03/30/14    Authorization:    Authorization Time Period:    Authorization Visit#:   of    Subjective Symptoms/Limitations Symptoms: "Its just that spot. and just from activites I've been doing." Pain Assessment Currently in Pain?: Yes Pain Score: 1  Pain Location: Shoulder Pain Orientation: Right Pain Type: Acute pain  Exercise/Treatments Supine Protraction: PROM;5 reps;AROM;15 reps Horizontal ABduction: PROM;5 reps;AROM;15 reps External Rotation: PROM;5 reps;AROM;15 reps Internal Rotation: PROM;5 reps;AROM;15 reps Flexion: PROM;5 reps;AROM;15 reps ABduction: PROM;5 reps;AROM;15 reps Standing Protraction: AROM;10 reps Horizontal ABduction: AROM;10 reps External Rotation: AROM;10 reps Internal Rotation: AROM;10 reps Flexion: AROM;10 reps ABduction: AROM;10 reps Other Standing Exercises: bicep curls - 8#, 10 reps, with stretch into extension with each rep     Manual Therapy Manual Therapy: Myofascial release Myofascial Release: Myofascial release (MFR) and manual stretching to Right bicep, upper arm, upper trap and scapular regions to decrease fascial restrictions and promote decreased pain with movement.     Occupational Therapy Assessment and Plan OT Assessment and Plan Clinical Impression Statement: continued supine MFR and AROM this session. Pt had some cramping/spasming n L bicep and upper arm this session, particularly during ER.  completed fewer reps in standing AROM this session, but with minimal OTR guidance.  Educated pt on HEP and provided handout - pt self guided through standing routine with min verbal cueing. pt able to tolerate 8 # bicep curl this session.  Pt indicated he has not  been completing HEP the past two weeks - encouraged pt to continue with HEP for overall decreased pain.   OT Plan: Follow up on HEP.  Continue bicep curls.  Add 1# to standing exercises and complete fewer reps supine.   Goals Short Term Goals Short Term Goal 1: Patient will be educated on HEP.  Short Term Goal 1 Progress: Met Short Term Goal 2: Patient will decrease pain to 4/10 or less during daily tasks.  Short Term Goal 2 Progress: Progressing toward goal Short Term Goal 3 Progress: Progressing toward goal Short Term Goal 4: Patient will increase AROM to [facility] to increase ability to complete daily tasks above shoulder level.  Short Term Goal 4 Progress: Met Short Term Goal 5: Patient will increase strength to 3/5 to increase ability to use right arm during work tasks, including reaching and carrying objects.  Short Term Goal 5 Progress: Met Long Term Goals Long Term Goal 1: Patient will return to highest level of independence during B/IADL tasks.  Long Term Goal 1 Progress: Progressing toward goal Long Term Goal 2: Patient will decrease pain to 2/10 or less to increase ability to sleep at night.  Long Term Goal 2 Progress: Progressing toward goal Long Term Goal 3: Patient will decrease fascial restrictions to trace amount or less.  Long Term Goal 3 Progress: Progressing toward goal Long Term Goal 4: Patient will increase AROM to WNL to increase ability to complete daily and work tasks above shoulder level.  Long Term Goal 4 Progress: Met Long Term Goal 5: Patient will increase strength to 5/5 to increase ability to use right arm during work tasks, including reaching, lifting, and carrying heavy objects.  Long Term Goal 5 Progress: Progressing toward goal  Problem List Patient Active Problem List   Diagnosis  Date Noted  . S/P rotator cuff repair 02/10/2014  . Pain in joint, shoulder region 02/10/2014  . Muscle weakness (generalized) 02/10/2014  . Decreased range of motion of right  shoulder 02/10/2014    End of Session Activity Tolerance: Patient tolerated treatment well General Behavior During Therapy: [facility] for tasks assessed/performed OT Plan of Care OT Home Exercise Plan: AAROM exercises      10/23 added AROM standing OT Patient Instructions: expalined and demonstrated.  pt return demonstrated.  handout provided (scanned) Consulted and Agree with Plan of Care: Patient  [place] Kyliee Ortego, [place], OTR/L [place] [phone] 03/20/2014, 2:12 PM

## 2014-03-23 ENCOUNTER — Ambulatory Visit (HOSPITAL_COMMUNITY)
Admission: RE | Admit: 2014-03-23 | Discharge: 2014-03-23 | Disposition: A | Discharge status: Home / Self Care | Payer: PRIVATE HEALTH INSURANCE | Source: Ambulatory Visit | Attending: Family Medicine | Admitting: Family Medicine

## 2014-03-23 DIAGNOSIS — Z5189 Encounter for other specified aftercare: Secondary | ICD-10-CM | POA: Diagnosis not present

## 2014-03-23 NOTE — Progress Notes (Signed)
Occupational Therapy Treatment Patient Details  Name: Marcus Chen MRN: [id number] Date of Birth: [birth year]  Today's Date: 03/23/2014 Time: 4315-4008 OT Time Calculation (min): 43 min MFR: 6761-9509 13' Therex: 3267-1245 30'  Visit#: 18 of 24  Re-eval: 03/30/14    Subjective Symptoms/Limitations Symptoms: S: I slept in my own house, so different bed, couldn't sleep.  It's hurting today.  Pain Assessment Currently in Pain?: Yes Pain Score: 3  Pain Location: Shoulder Pain Orientation: Right Pain Type: Acute pain  Precautions/Restrictions  Precautions Precautions: Shoulder Type of Shoulder Precautions: PROM for 4 weeks (03/10/14). Progress to AAROM, AROM, and strengthening as tolerated.   Exercise/Treatments Supine Protraction: PROM;5 reps;Strengthening;Weights;10 reps Protraction Weight (lbs): 1 Horizontal ABduction: PROM;5 reps;Strengthening;Weights;10 reps Horizontal ABduction Weight (lbs): 1 External Rotation: PROM;5 reps;Strengthening;Weights;10 reps External Rotation Weight (lbs): 1 Internal Rotation: PROM;5 reps;Strengthening;Weights;10 reps Internal Rotation Weight (lbs): 1 Flexion: PROM;5 reps;Strengthening;Weights;10 reps Shoulder Flexion Weight (lbs): 1 ABduction: PROM;5 reps;Strengthening;Weights;10 reps Shoulder ABduction Weight (lbs): 1   Standing Protraction: Strengthening;Weights;10 reps Protraction Weight (lbs): 1 Horizontal ABduction: Strengthening;Weights;10 reps Horizontal ABduction Weight (lbs): 1 External Rotation: Strengthening;Weights;10 reps External Rotation Weight (lbs): 1 Internal Rotation: Strengthening;Weights;10 reps Internal Rotation Weight (lbs): 1 Flexion: Strengthening;Weights;10 reps Shoulder Flexion Weight (lbs): 1 ABduction: Strengthening;Weights;AROM;5 reps (5 using weight; 5 without) Shoulder ABduction Weight (lbs): 1 Other Standing Exercises: bicep curls - 8#, 10 reps, with stretch into extension with each rep   ROM /  Strengthening / Isometric Strengthening UBE (Upper Arm Bike): Level 1 3' forward 3' reverse X to V Arms: 12 reps Proximal Shoulder Strengthening, Supine: 10x with 1# weight Proximal Shoulder Strengthening, Seated: 10x with 1# weight        Manual Therapy Manual Therapy: Myofascial release Myofascial Release: Myofascial release (MFR) and manual stretching to Right bicep, upper arm, upper trap and scapular regions to decrease fascial restrictions and promote decreased pain with movement.  Occupational Therapy Assessment and Plan OT Assessment and Plan Clinical Impression Statement: A: Added 1# weight in supine, standing, and proximal shoulder strengthening, 10 repetitions each. Resumed UBE. Patient had increased spasms/cramps in bicep during manual stretching and during exercises.  Patient tolerated treatment well. Patient reports he slept on his arm last night and it has been bothering him all morning. Patient reports he is completing his exercises at home and they help stretch his arm out.   OT Plan: P: Follow up on increased pain/spasming in bicep.  Add w arms. Attempt Cybex row & press.    Goals Short Term Goals Short Term Goal 1: Patient will be educated on HEP.  Short Term Goal 2: Patient will decrease pain to 4/10 or less during daily tasks.  Short Term Goal 2 Progress: Progressing toward goal Short Term Goal 3: Patient will decrease fascial restrictions from mod to min amount.  Short Term Goal 3 Progress: Progressing toward goal Short Term Goal 4: Patient will increase AROM to [facility] to increase ability to complete daily tasks above shoulder level.  Short Term Goal 5: Patient will increase strength to 3/5 to increase ability to use right arm during work tasks, including reaching and carrying objects.  Long Term Goals Long Term Goal 1: Patient will return to highest level of independence during B/IADL tasks.  Long Term Goal 1 Progress: Progressing toward goal Long Term Goal 2: Patient  will decrease pain to 2/10 or less to increase ability to sleep at night.  Long Term Goal 2 Progress: Progressing toward goal Long Term Goal 3: Patient will decrease fascial  restrictions to trace amount or less.  Long Term Goal 3 Progress: Progressing toward goal Long Term Goal 4: Patient will increase AROM to WNL to increase ability to complete daily and work tasks above shoulder level.  Long Term Goal 5: Patient will increase strength to 5/5 to increase ability to use right arm during work tasks, including reaching, lifting, and carrying heavy objects.  Long Term Goal 5 Progress: Progressing toward goal  Problem List Patient Active Problem List   Diagnosis Date Noted  . S/P rotator cuff repair 02/10/2014  . Pain in joint, shoulder region 02/10/2014  . Muscle weakness (generalized) 02/10/2014  . Decreased range of motion of right shoulder 02/10/2014    End of Session Activity Tolerance: Patient tolerated treatment well General Behavior During Therapy: [facility] for tasks assessed/performed   [name]. OT Student  03/23/2014, 11:53 AM

## 2014-03-23 NOTE — Progress Notes (Signed)
Note reviewed by clinical instructor and accurately reflects treatment session.  Khoen Genet, OTR/L,CBIS   

## 2014-03-25 ENCOUNTER — Ambulatory Visit (HOSPITAL_COMMUNITY)
Admission: RE | Admit: 2014-03-25 | Discharge: 2014-03-25 | Disposition: A | Discharge status: Home / Self Care | Payer: PRIVATE HEALTH INSURANCE | Source: Ambulatory Visit | Attending: Family Medicine | Admitting: Family Medicine

## 2014-03-25 DIAGNOSIS — Z5189 Encounter for other specified aftercare: Secondary | ICD-10-CM | POA: Diagnosis not present

## 2014-03-25 NOTE — Progress Notes (Signed)
Occupational Therapy Treatment Patient Details  Name: Marcus Chen MRN: [id number] Date of Birth: 08/06/1965  Today's Date: 03/25/2014 Time: 1540-0867 OT Time Calculation (min): 47 min Manual 1110-1137 (27') Therapeutic Exercises 1137-1157 (20')  Visit#: 19 of 24  Re-eval: 03/30/14    Authorization:    Authorization Time Period:    Authorization Visit#:   of    Subjective Symptoms/Limitations Symptoms: "They're a little better today. I attribute it to not doing anything yesterday." Pain Assessment Currently in Pain?: Yes Pain Score: 1  Pain Location: Shoulder Pain Orientation: Right Pain Type: Acute pain  Exercise/Treatments Supine Protraction: PROM;5 reps;Strengthening;Weights;10 reps Protraction Weight (lbs): 1 Horizontal ABduction: PROM;5 reps;Strengthening;Weights;10 reps Horizontal ABduction Weight (lbs): 1 External Rotation: PROM;5 reps;Strengthening;Weights;10 reps External Rotation Weight (lbs): 1 Internal Rotation: PROM;5 reps;Strengthening;Weights;10 reps Internal Rotation Weight (lbs): 1 Flexion: PROM;5 reps;Strengthening;Weights;10 reps Shoulder Flexion Weight (lbs): 1 ABduction: PROM;5 reps;Strengthening;Weights;10 reps Shoulder ABduction Weight (lbs): 1 Standing Protraction: Strengthening;Weights;10 reps Protraction Weight (lbs): 1 Horizontal ABduction: Strengthening;Weights;10 reps Horizontal ABduction Weight (lbs): 1 External Rotation: Strengthening;Weights;10 reps External Rotation Weight (lbs): 1 Internal Rotation: Strengthening;Weights;10 reps Internal Rotation Weight (lbs): 1 Flexion: Strengthening;Weights;10 reps Shoulder Flexion Weight (lbs): 1 ABduction: Strengthening;Weights;10 reps Shoulder ABduction Weight (lbs): 1 ROM / Strengthening / Isometric Strengthening Cybex Press: 2 plate;10 reps Cybex Row: 2 plate;10 reps "W" Arms: 10 reps X to V Arms: 10 reps with 1# Proximal Shoulder Strengthening, Supine: 10x with 1# weight    Stretches Internal Rotation Stretch: 3 reps (10 second holds. with towel)    Manual Therapy Manual Therapy: Myofascial release Myofascial Release: Myofascial release (MFR) and manual stretching to Right bicep, upper arm, upper trap and scapular regions to decrease fascial restrictions and promote decreased pain with movement.    Occupational Therapy Assessment and Plan OT Assessment and Plan Clinical Impression Statement: Focused MFR on bicep this session, to decrease tightness prior to PROM and 1# exercises.  Pt had decrease in spasms during 1# this session. Maintained reps at 10 in both supine and standing.  Added w arms and cybex press/row with good tolerance.  Added IR towel stretch this session, and pt tolerated well. Pt to use IR stretch in HEP. Pt verbalized decreased pain and tightness in shoulder at end of session.   OT Plan: Follow up on pain.  Add prone Hughston exercises.   Goals Short Term Goals Short Term Goal 1: Patient will be educated on HEP.  Short Term Goal 1 Progress: Met Short Term Goal 2: Patient will decrease pain to 4/10 or less during daily tasks.  Short Term Goal 2 Progress: Progressing toward goal Short Term Goal 3: Patient will decrease fascial restrictions from mod to min amount.  Short Term Goal 3 Progress: Progressing toward goal Short Term Goal 4: Patient will increase AROM to [facility] to increase ability to complete daily tasks above shoulder level.  Short Term Goal 4 Progress: Met Short Term Goal 5: Patient will increase strength to 3/5 to increase ability to use right arm during work tasks, including reaching and carrying objects.  Short Term Goal 5 Progress: Met Long Term Goals Long Term Goal 1: Patient will return to highest level of independence during B/IADL tasks.  Long Term Goal 1 Progress: Progressing toward goal Long Term Goal 2: Patient will decrease pain to 2/10 or less to increase ability to sleep at night.  Long Term Goal 2 Progress: Progressing  toward goal Long Term Goal 3: Patient will decrease fascial restrictions to trace amount or less.  Long  Term Goal 3 Progress: Progressing toward goal Long Term Goal 4: Patient will increase AROM to WNL to increase ability to complete daily and work tasks above shoulder level.  Long Term Goal 4 Progress: Met Long Term Goal 5: Patient will increase strength to 5/5 to increase ability to use right arm during work tasks, including reaching, lifting, and carrying heavy objects.  Long Term Goal 5 Progress: Progressing toward goal  Problem List Patient Active Problem List   Diagnosis Date Noted  . S/P rotator cuff repair 02/10/2014  . Pain in joint, shoulder region 02/10/2014  . Muscle weakness (generalized) 02/10/2014  . Decreased range of motion of right shoulder 02/10/2014    End of Session Activity Tolerance: Patient tolerated treatment well General Behavior During Therapy: [facility] for tasks assessed/performed OT Plan of Care OT Home Exercise Plan: AAROM exercises      10/23 added AROM standing     10/28 added IR towel strech OT Patient Instructions: Explained and demonstrated.  Pt return demonstrated and verbalized understanding. Consulted and Agree with Plan of Care: Patient  Milton Saylee Sherrill, [place], OTR/L [place] [phone] 03/25/2014, 12:09 PM

## 2014-03-27 ENCOUNTER — Ambulatory Visit (HOSPITAL_COMMUNITY)
Admission: RE | Admit: 2014-03-27 | Discharge: 2014-03-27 | Disposition: A | Discharge status: Home / Self Care | Payer: PRIVATE HEALTH INSURANCE | Source: Ambulatory Visit | Attending: Family Medicine | Admitting: Family Medicine

## 2014-03-27 DIAGNOSIS — Z5189 Encounter for other specified aftercare: Secondary | ICD-10-CM | POA: Diagnosis not present

## 2014-03-27 NOTE — Progress Notes (Signed)
Occupational Therapy Treatment Patient Details  Name: Marcus Chen MRN: [id number] Date of Birth: August 16, 1965  Today's Date: 03/27/2014 Time: 9357-0177 OT Time Calculation (min): 45 min Manual 9390-3009 (14') Therapeutic Exercises 1315-1346 (70')  Visit#: 21 of 24  Re-eval: 03/30/14    Authorization:    Authorization Time Period:    Authorization Visit#:   of    Subjective Symptoms/Limitations Symptoms: "i don't know what we did.  maybe that towel exercises. But its caused more grief.  I had to swtich to aleve this morning." Pain Assessment Currently in Pain?: Yes Pain Score: 1  Pain Orientation: Right Pain Type: Acute pain  Exercise/Treatments Supine Protraction: PROM;5 reps;Strengthening;Weights;12 reps Protraction Weight (lbs): 1 Horizontal ABduction: PROM;5 reps;Strengthening;Weights;12 reps Horizontal ABduction Weight (lbs): 1 External Rotation: PROM;5 reps;Strengthening;Weights;12 reps External Rotation Weight (lbs): 1 Internal Rotation: PROM;5 reps;Strengthening;Weights;12 reps Internal Rotation Weight (lbs): 1 Flexion: PROM;5 reps;Strengthening;Weights;12 reps Shoulder Flexion Weight (lbs): 1 ABduction: PROM;5 reps;Strengthening;Weights;12 reps Shoulder ABduction Weight (lbs): 1 Prone  Other Prone Exercises: Hughston prone Exercises 1-6, 10 reps each AROM Standing Protraction: Strengthening;Weights;15 reps Protraction Weight (lbs): 1 Horizontal ABduction: Strengthening;Weights;15 reps Horizontal ABduction Weight (lbs): 1 External Rotation: Strengthening;Weights;15 reps External Rotation Weight (lbs): 1 Internal Rotation: Strengthening;Weights;15 reps Internal Rotation Weight (lbs): 1 Flexion: Strengthening;Weights;15 reps Shoulder Flexion Weight (lbs): 1 ABduction: Strengthening;Weights;15 reps Shoulder ABduction Weight (lbs): 1 ROM / Strengthening / Isometric Strengthening Cybex Press: 2 plate;15 reps Cybex Row: 2 plate;15 reps Proximal Shoulder  Strengthening, Supine: 12x with 1# weight   Stretches Internal Rotation Stretch: 3 reps (10 second holds, with towel)    Manual Therapy Manual Therapy: Myofascial release Myofascial Release: Myofascial release (MFR) and manual stretching to Right bicep, upper arm, upper trap and scapular regions to decrease fascial restrictions and promote decreased pain with movement.     Occupational Therapy Assessment and Plan OT Assessment and Plan Clinical Impression Statement: Pt with decreased fascial restrictions and pain in bicep this session.  Increasd supine 1# reps, with good tolerance.  Added prone hughtson exercises with good tolerance, but fatigue.  Incrased standng reps with good tolerance.  continued IR towel stretch - pt with increased soreness after previous stretch, but had improved tolerance this session. Verbalized that he will beign stretch at home.   OT Plan: Re-Eval.  Add 1# to prone exercises.   Goals Short Term Goals Short Term Goal 1: Patient will be educated on HEP.  Short Term Goal 1 Progress: Met Short Term Goal 2: Patient will decrease pain to 4/10 or less during daily tasks.  Short Term Goal 2 Progress: Progressing toward goal Short Term Goal 3: Patient will decrease fascial restrictions from mod to min amount.  Short Term Goal 3 Progress: Progressing toward goal Short Term Goal 4: Patient will increase AROM to [facility] to increase ability to complete daily tasks above shoulder level.  Short Term Goal 4 Progress: Met Short Term Goal 5: Patient will increase strength to 3/5 to increase ability to use right arm during work tasks, including reaching and carrying objects.  Short Term Goal 5 Progress: Met Long Term Goals Long Term Goal 1: Patient will return to highest level of independence during B/IADL tasks.  Long Term Goal 1 Progress: Progressing toward goal Long Term Goal 2: Patient will decrease pain to 2/10 or less to increase ability to sleep at night.  Long Term Goal 2  Progress: Progressing toward goal Long Term Goal 3: Patient will decrease fascial restrictions to trace amount or less.  Long Term Goal  3 Progress: Progressing toward goal Long Term Goal 4: Patient will increase AROM to WNL to increase ability to complete daily and work tasks above shoulder level.  Long Term Goal 4 Progress: Met Long Term Goal 5: Patient will increase strength to 5/5 to increase ability to use right arm during work tasks, including reaching, lifting, and carrying heavy objects.  Long Term Goal 5 Progress: Progressing toward goal  Problem List Patient Active Problem List   Diagnosis Date Noted  . S/P rotator cuff repair 02/10/2014  . Pain in joint, shoulder region 02/10/2014  . Muscle weakness (generalized) 02/10/2014  . Decreased range of motion of right shoulder 02/10/2014    End of Session Activity Tolerance: Patient tolerated treatment well General Behavior During Therapy: [facility] for tasks assessed/performed  GO    Bea Graff Mareta Chesnut, [place], OTR/L [place] 03/27/2014, 3:08 PM

## 2014-03-30 ENCOUNTER — Ambulatory Visit (HOSPITAL_COMMUNITY)
Admission: RE | Admit: 2014-03-30 | Discharge: 2014-03-30 | Disposition: A | Discharge status: Home / Self Care | Payer: PRIVATE HEALTH INSURANCE | Source: Ambulatory Visit | Attending: Family Medicine | Admitting: Family Medicine

## 2014-03-30 ENCOUNTER — Encounter (HOSPITAL_COMMUNITY): Payer: Self-pay

## 2014-03-30 DIAGNOSIS — Z5189 Encounter for other specified aftercare: Secondary | ICD-10-CM | POA: Insufficient documentation

## 2014-03-30 DIAGNOSIS — M25511 Pain in right shoulder: Secondary | ICD-10-CM | POA: Diagnosis not present

## 2014-03-30 DIAGNOSIS — M6281 Muscle weakness (generalized): Secondary | ICD-10-CM | POA: Diagnosis not present

## 2014-03-30 DIAGNOSIS — Z9889 Other specified postprocedural states: Secondary | ICD-10-CM

## 2014-03-30 NOTE — Patient Instructions (Signed)
Scalene Stretch, Sitting  Repeat __10_ times per session. Do _1-2__ sessions per day.  Copyright  VHI. All rights reserved.  Scalene Stretch, Sitting   Sit, one hand tucked under hip on side to be stretched, other hand over top of head. Gently pull head to side and backwards. Hold ___ seconds. For more stretch, lean body in direction of head pull. Repeat ___ times per session. Do ___ sessions per day.  Copyright  VHI. All rights reserved.  Side Bend, Standing   Stand, one hand grasping other arm above wrist behind body. Pull down while gently tilting head toward pulling side. Hold ___ seconds. Repeat ___ times per session. Do ___ sessions per day.  Copyright  VHI. All rights reserved.  Levator Scapula Stretch, Sitting   Sit, one hand on same-side shoulder blade, other hand on head. Gently pull head down and away. Hold ___ seconds. Repeat ___ times per session. Do ___ sessions per day.  Copyright  VHI. All rights reserved.  Lower Cervical / Upper Thoracic Stretch   Clasp hands together in front with arms extended. Gently pull shoulder blades apart and bend head forward. Hold ____ seconds. Repeat ____ times per set. Do ____ sets per session. Do ____ sessions per day.  http://orth.exer.us/354   Copyright  VHI. All rights reserved.  Axial Extension (Chin Tuck)   Pull chin in and lengthen back of neck. Hold ____ seconds while counting out loud. Repeat ____ times. Do ____ sessions per day.  http://gt2.exer.us/449   Copyright  VHI. All rights reserved.  

## 2014-03-30 NOTE — Therapy (Addendum)
Occupational Therapy Reassessment  Patient Details  Name: Marcus Chen MRN: [id number] Date of Birth: [birth year]  Encounter Date: 03/30/2014      OT End of Session - 03/30/14 1153    Visit Number 22   Number of Visits 32   Date for OT Re-Evaluation 04/27/14   OT Start Time 1108   OT Stop Time 1145   OT Time Calculation (min) 37 min   Activity Tolerance Patient tolerated treatment well      Past Medical History  Diagnosis Date  . Kidney stone   . Gout     Past Surgical History  Procedure Laterality Date  . Appendectomy      There were no vitals taken for this visit.  Visit Diagnosis:  '@DIAG' @      Subjective Assessment - 03/30/14 1203    Symptoms S: My shoulder is really hurting today.  I slept on it last night and it's raining today.    Currently in Pain? Yes   Pain Score 2    Pain Location Shoulder   Pain Orientation Right   Pain Type Acute pain          [facility] OT Assessment - 03/30/14 1100    Diagnosis S/P rotator cuff repair    Outcome Measures Mod fascial restrictions in right upper arm, trapezius and scapularis region.   Right Shoulder Flexion 180 Degrees   Right Shoulder ABduction 180 Degrees   Right Shoulder Internal Rotation 90 Degrees   Right Shoulder External Rotation 71 Degrees   Overall Strength --  assessed seated. IR/ER adducted   Left Shoulder Flexion 5/5   Left Shoulder ABduction 5/5   Left Shoulder Internal Rotation 4/5   Left Shoulder External Rotation 4/5     Prone Hughston exercises; 10X; 1# with 5 positions  Manual Therapy Myofascial release and manual stretching to right upper arm, upper trapezius, and scapularis region to decrease fascial restrictions and increase joint mobility in a pain free zone.        Education - 03/30/14 1140    Education provided Yes   Education Details Cervical stretches   Education Details Patient   Methods Explanation;Demonstration;Handout   Comprehension Verbalized understanding;Returned  demonstration          OT Short Term Goals - 03/30/14 1211    Title Patient will be educated on HEP.    Time 6   Period Weeks   Status Achieved   Title Patient will decrease pain to 4/10 or less during daily tasks.    Time 6   Period Weeks   Status Achieved   Title Patient will decrease fascial restrictions from mod to min amount.    Time 6   Period Weeks   Status On-going   Title Patient will increase AROM to [facility] to increase ability to complete daily tasks above shoulder level.   Time 6   Period Weeks   Status Achieved   Title Patient will increase strength to 3/5 to increase ability to use right arm during work tasks, including reaching and carrying objects.    Time 6   Period Weeks   Status Achieved          OT Long Term Goals - 03/30/14 1156    Title Patient will return to highest level of independence during B/IADL tasks.    Time 12   Period Weeks   Status On-going   Title Patient will decrease pain to 2/10 or less to increase ability to sleep at night.  Time 12   Period Weeks   Status On-going   Title Patient will decrease fascial restrictions to trace amount or less.   Time 12   Period Weeks   Status On-going   Title AROM to WNL to increase ability to complete daily and work tasks above shoulder level.    Time 12   Period Weeks   Status Achieved   Title Patient will increase strength to 5/5 to increase ability to use right arm during work tasks, including reaching, lifting, and carrying heavy objects.   Time 12   Period Weeks   Status On-going          Plan - 03/30/14 1153    Clinical Impression Statement A: Reassessment completed today. Patient has met 4/5 short-term goals and 1/5 long-term goals, total. Patient reports less spasms in his bicep region.  Patient has the range of motion and strength to perform daily and work tasks, however is limited by pain. Patient continues to report difficulty sleeping due to pain. Added 1# weights to prone  hughston exercises, provided patient with cervical stretches.    OT Frequency Min 3X/week   OT Duration 4 weeks   OT Plan P: Follow-up on cervical stretches. Use ball for myofascial release at upper trapezius.  Perform cervical stretches.         Problem List Patient Active Problem List   Diagnosis Date Noted  . S/P rotator cuff repair 02/10/2014  . Pain in joint, shoulder region 02/10/2014  . Muscle weakness (generalized) 02/10/2014  . Decreased range of motion of right shoulder 02/10/2014      Ailene Ravel, OTR/L,CBIS   03/30/2014, 12:11 PM

## 2014-04-01 ENCOUNTER — Encounter (HOSPITAL_COMMUNITY): Payer: Self-pay

## 2014-04-01 ENCOUNTER — Ambulatory Visit (HOSPITAL_COMMUNITY): Payer: PRIVATE HEALTH INSURANCE

## 2014-04-01 ENCOUNTER — Ambulatory Visit (HOSPITAL_COMMUNITY)
Admission: RE | Admit: 2014-04-01 | Discharge: 2014-04-01 | Disposition: A | Discharge status: Home / Self Care | Payer: PRIVATE HEALTH INSURANCE | Source: Ambulatory Visit | Attending: Family Medicine | Admitting: Family Medicine

## 2014-04-01 DIAGNOSIS — M25611 Stiffness of right shoulder, not elsewhere classified: Secondary | ICD-10-CM

## 2014-04-01 DIAGNOSIS — M25511 Pain in right shoulder: Secondary | ICD-10-CM

## 2014-04-01 DIAGNOSIS — M6281 Muscle weakness (generalized): Secondary | ICD-10-CM

## 2014-04-01 DIAGNOSIS — Z9889 Other specified postprocedural states: Secondary | ICD-10-CM

## 2014-04-01 DIAGNOSIS — Z5189 Encounter for other specified aftercare: Secondary | ICD-10-CM | POA: Diagnosis not present

## 2014-04-01 NOTE — Therapy (Addendum)
Occupational Therapy Treatment  Patient Details  Name: Marcus Chen MRN: [id number] Date of Birth: 1965-09-30  Encounter Date: 04/01/2014      OT End of Session - 04/01/14 1207    Visit Number 23   Number of Visits 32   Date for OT Re-Evaluation 04/27/14   OT Start Time 1108   OT Stop Time 1155   OT Time Calculation (min) 47 min   Activity Tolerance Patient tolerated treatment well      Past Medical History  Diagnosis Date  . Kidney stone   . Gout     Past Surgical History  Procedure Laterality Date  . Appendectomy      There were no vitals taken for this visit.  Visit Diagnosis:  S/P rotator cuff repair  Pain in joint, shoulder region, right  Muscle weakness (generalized)  Decreased range of motion of right shoulder     04/01/14 1111  Symptoms/Limitations  Symptoms "the doctor was pleased with my progress - but i'm on light duty for another month."  Pain Assessment  Currently in Pain? No/denies     Manual Therapy: Myofascial Release: Myofascial release (MFR) and manual stretching to Right bicep, upper arm, upper trap and scapular regions to decrease fascial restrictions and promote decreased pain with movement.       OT Treatments/Exercises (OP) - 04/01/14 0700    Shoulder Exercises: Supine   Protraction PROM;5 reps;Strengthening;Weights;15 reps   Protraction Weight (lbs) 1   Horizontal ABduction PROM;5 reps;Strengthening;Weights;15 reps   Horizontal ABduction Weight (lbs) 1   External Rotation PROM;5 reps;Strengthening;Weights;15 reps   External Rotation Weight (lbs) 1   Internal Rotation PROM;5 reps;Strengthening;Weights;15 reps   Internal Rotation Weight (lbs) 1   Flexion PROM;5 reps;Strengthening;Weights;15 reps   Shoulder Flexion Weight (lbs) 1   ABduction PROM;5 reps;Strengthening;Weights;15 reps   Shoulder ABduction Weight (lbs) 1   Shoulder Exercises: Prone   Other Prone Exercises [place] prone Exercises 1-6, 10 reps each with 1# weight    Shoulder Exercises: Standing   Protraction Strengthening;Weights;15 reps   Protraction Weight (lbs) 1   Horizontal ABduction Strengthening;Weights;15 reps   Horizontal ABduction Weight (lbs) 1   External Rotation Strengthening;Weights;15 reps   External Rotation Weight (lbs) 1   Internal Rotation Strengthening;Weights;15 reps   Internal Rotation Weight (lbs) 1   Flexion Strengthening;Weights;15 reps   Shoulder Flexion Weight (lbs) 1   ABduction Strengthening;Weights;15 reps   Shoulder ABduction Weight (lbs) 1   Shoulder Exercises: ROM/Strengthening   Proximal Shoulder Strengthening, Supine 15x with 1# weight            OT Short Term Goals - 04/01/14 1210    OT SHORT TERM GOAL #1   Title Patient will be educated on HEP.    Status Achieved   OT SHORT TERM GOAL #2   Title Patient will decrease pain to 4/10 or less during daily tasks.    Status Achieved   OT SHORT TERM GOAL #3   Title Patient will decrease fascial restrictions from mod to min amount.    Status On-going   OT SHORT TERM GOAL #4   Title Patient will increase AROM to [facility] to increase ability to complete daily tasks above shoulder level.   Status Achieved   OT SHORT TERM GOAL #5   Title Patient will increase strength to 3/5 to increase ability to use right arm during work tasks, including reaching and carrying objects.    Status Achieved  OT Long Term Goals - 04/01/14 1210    OT LONG TERM GOAL #1   Title Patient will return to highest level of independence during B/IADL tasks.    Status On-going   OT LONG TERM GOAL #2   Title Patient will decrease pain to 2/10 or less to increase ability to sleep at night.    Status On-going   OT LONG TERM GOAL #3   Title Patient will decrease fascial restrictions to trace amount or less.   Status On-going   OT LONG TERM GOAL #4   Title AROM to WNL to increase ability to complete daily and work tasks above shoulder level.    Status Achieved   OT LONG TERM  GOAL #5   Title Patient will increase strength to 5/5 to increase ability to use right arm during work tasks, including reaching, lifting, and carrying heavy objects.   Status On-going          Plan - 04/01/14 1208    Clinical Impression Statement Continued 1# exercises in supine and standing with good tolerance.  Pt indicated he has been completing cervical neck stretches and no longer has neck tightness.  continued 1# prone exercises with good tolerance.  pt reports that MD visit went well, and that he will continue on light work duty for an additional month.  he is also now taking a musle relaxer for imporved sleep at night.   OT Plan Add 2# to standing exercises (but continue 1# abduction standing).        Problem List Patient Active Problem List   Diagnosis Date Noted  . S/P rotator cuff repair 02/10/2014  . Pain in joint, shoulder region 02/10/2014  . Muscle weakness (generalized) 02/10/2014  . Decreased range of motion of right shoulder 02/10/2014     Bea Graff Shardee Dieu, MS, OTR/L [place] [phone] 04/01/2014, 12:11 PM

## 2014-04-01 NOTE — Addendum Note (Signed)
Encounter addended by: Meredith Pel, OT on: 04/01/2014 12:25 PM<BR>     Documentation filed: Clinical Notes

## 2014-04-01 NOTE — Addendum Note (Signed)
Encounter addended by: Debby Bud, OT on: 04/01/2014  3:43 PM<BR>     Documentation filed: Clinical Notes

## 2014-04-02 ENCOUNTER — Ambulatory Visit (HOSPITAL_COMMUNITY): Payer: PRIVATE HEALTH INSURANCE

## 2014-04-03 ENCOUNTER — Ambulatory Visit (HOSPITAL_COMMUNITY): Payer: PRIVATE HEALTH INSURANCE

## 2014-04-06 ENCOUNTER — Encounter (HOSPITAL_COMMUNITY): Payer: Self-pay

## 2014-04-06 ENCOUNTER — Ambulatory Visit (HOSPITAL_COMMUNITY)
Admission: RE | Admit: 2014-04-06 | Discharge: 2014-04-06 | Disposition: A | Discharge status: Home / Self Care | Payer: PRIVATE HEALTH INSURANCE | Source: Ambulatory Visit | Attending: Family Medicine | Admitting: Family Medicine

## 2014-04-06 DIAGNOSIS — M25511 Pain in right shoulder: Secondary | ICD-10-CM

## 2014-04-06 DIAGNOSIS — Z5189 Encounter for other specified aftercare: Secondary | ICD-10-CM | POA: Diagnosis not present

## 2014-04-06 DIAGNOSIS — M25611 Stiffness of right shoulder, not elsewhere classified: Secondary | ICD-10-CM

## 2014-04-06 DIAGNOSIS — M6281 Muscle weakness (generalized): Secondary | ICD-10-CM

## 2014-04-06 DIAGNOSIS — Z9889 Other specified postprocedural states: Secondary | ICD-10-CM

## 2014-04-06 NOTE — Therapy (Signed)
Occupational Therapy Treatment  Patient Details  Name: Marcus Chen MRN: [id number] Date of Birth: [birth year]  Encounter Date: 04/06/2014      OT End of Session - 04/06/14 1153    Visit Number 24   Number of Visits 32   Date for OT Re-Evaluation 04/27/14   OT Start Time 1108   OT Stop Time 1146   OT Time Calculation (min) 38 min   Activity Tolerance Patient tolerated treatment well      Past Medical History  Diagnosis Date  . Kidney stone   . Gout     Past Surgical History  Procedure Laterality Date  . Appendectomy      There were no vitals taken for this visit.  Visit Diagnosis:  S/P rotator cuff repair  Pain in joint, shoulder region, right  Muscle weakness (generalized)  Decreased range of motion of right shoulder        [facility] OT Assessment - 04/06/14 1200    Precautions   Precautions Shoulder   Type of Shoulder Precautions Strengthening as tolerated     Symptoms/Limitations  Symptoms S: I have shingles and am being treated for that with prednisone, so I'm feeling great.   Pain Assessment  Currently in Pain? No/denies        OT Treatments/Exercises (OP) - 04/06/14 1147    Shoulder Exercises: Supine   Protraction PROM;5 reps;Strengthening;Weights;12 reps   Protraction Weight (lbs) 2   Horizontal ABduction PROM;5 reps;Strengthening;Weights;12 reps   Horizontal ABduction Weight (lbs) 2   External Rotation PROM;5 reps;Strengthening;Weights;12 reps   External Rotation Weight (lbs) 2   Internal Rotation PROM;5 reps;Strengthening;Weights;12 reps   Internal Rotation Weight (lbs) 2   Flexion PROM;5 reps;Strengthening;Weights;12 reps   Shoulder Flexion Weight (lbs) 2   ABduction PROM;5 reps;Strengthening;Weights;12 reps   Shoulder ABduction Weight (lbs) 2   Shoulder Exercises: Prone   Other Prone Exercises [place] prone Exercises 1-5, 12 reps each with 2# weight   Shoulder Exercises: Standing   Protraction Strengthening;Weights;12 reps   Protraction Weight (lbs) 2   Horizontal ABduction Strengthening;Weights;12 reps   Horizontal ABduction Weight (lbs) 2   External Rotation Strengthening;Weights;12 reps   External Rotation Weight (lbs) 2   Internal Rotation Strengthening;Weights;12 reps   Internal Rotation Weight (lbs) 2   Flexion Strengthening;Weights;12 reps   Shoulder Flexion Weight (lbs) 2   ABduction Strengthening;Weights;12 reps   Shoulder ABduction Weight (lbs) 2   Shoulder Exercises: ROM/Strengthening   Cybex Press 3 plate;15 reps   Cybex Row 3 plate;15 reps   X to V Arms 12X with 2# weight   Proximal Shoulder Strengthening, Supine 12X with 2# weight   Proximal Shoulder Strengthening, Seated 12X with 2# weight            OT Short Term Goals - 04/06/14 1156    OT SHORT TERM GOAL #1   Title Patient will be educated on HEP.    Status Achieved   OT SHORT TERM GOAL #2   Title Patient will decrease pain to 4/10 or less during daily tasks.    Status Achieved   OT SHORT TERM GOAL #3   Title Patient will decrease fascial restrictions from mod to min amount.    Time 6   Period Weeks   Status On-going   OT SHORT TERM GOAL #4   Title Patient will increase AROM to [facility] to increase ability to complete daily tasks above shoulder level.   Status Achieved   OT SHORT TERM GOAL #5  Title Patient will increase strength to 3/5 to increase ability to use right arm during work tasks, including reaching and carrying objects.    Status Achieved          OT Long Term Goals - 04/06/14 1156    OT LONG TERM GOAL #1   Title Patient will return to highest level of independence during B/IADL tasks.    Time 12   Period Weeks   Status On-going   OT LONG TERM GOAL #2   Title Patient will decrease pain to 2/10 or less to increase ability to sleep at night.    Time 12   Period Weeks   Status On-going   OT LONG TERM GOAL #3   Title Patient will decrease fascial restrictions to trace amount or less.   Time 12    Period Weeks   Status On-going   OT LONG TERM GOAL #4   Title AROM to WNL to increase ability to complete daily and work tasks above shoulder level.    Status Achieved   OT LONG TERM GOAL #5   Title Patient will increase strength to 5/5 to increase ability to use right arm during work tasks, including reaching, lifting, and carrying heavy objects.   Time 12   Period Weeks   Status On-going          Plan - 04/06/14 1153    Clinical Impression Statement A: Increased weight to 2# during supine, standing, and prone exercises. Increased cybex row/press weight to 3# plate. Patient tolerated treatment well. Patient reports he is taking 2 additional medications now for shingles and they have also helped with his pain. Patient continues to complete cervical stretches to help with tightness.    OT Plan P: Increase repetitions to 2# in supine. Add ball on wall, if able to tolerate.         Problem List Patient Active Problem List   Diagnosis Date Noted  . S/P rotator cuff repair 02/10/2014  . Pain in joint, shoulder region 02/10/2014  . Muscle weakness (generalized) 02/10/2014  . Decreased range of motion of right shoulder 02/10/2014     Ailene Ravel, OTR/L,CBIS   04/06/2014, 12:00 PM

## 2014-04-08 ENCOUNTER — Ambulatory Visit (HOSPITAL_COMMUNITY): Payer: PRIVATE HEALTH INSURANCE

## 2014-04-08 NOTE — Addendum Note (Signed)
Encounter addended by: Meredith Pel, OT on: 04/08/2014  3:35 PM<BR>     Documentation filed: Clinical Notes

## 2014-04-10 ENCOUNTER — Encounter (HOSPITAL_COMMUNITY): Payer: Self-pay

## 2014-04-10 ENCOUNTER — Ambulatory Visit (HOSPITAL_COMMUNITY)
Admission: RE | Admit: 2014-04-10 | Discharge: 2014-04-10 | Disposition: A | Discharge status: Home / Self Care | Payer: PRIVATE HEALTH INSURANCE | Source: Ambulatory Visit | Attending: Family Medicine | Admitting: Family Medicine

## 2014-04-10 DIAGNOSIS — Z5189 Encounter for other specified aftercare: Secondary | ICD-10-CM | POA: Diagnosis not present

## 2014-04-10 DIAGNOSIS — M25511 Pain in right shoulder: Secondary | ICD-10-CM

## 2014-04-10 DIAGNOSIS — Z9889 Other specified postprocedural states: Secondary | ICD-10-CM

## 2014-04-10 DIAGNOSIS — M6281 Muscle weakness (generalized): Secondary | ICD-10-CM

## 2014-04-10 DIAGNOSIS — M25611 Stiffness of right shoulder, not elsewhere classified: Secondary | ICD-10-CM

## 2014-04-10 NOTE — Therapy (Signed)
Occupational Therapy Treatment  Patient Details  Name: Marcus Chen MRN: [id number] Date of Birth: December 22, 1965  Encounter Date: 04/10/2014      OT End of Session - 04/10/14 1342    Visit Number 25   Number of Visits 32   Date for OT Re-Evaluation 04/27/14   OT Start Time 1304   OT Stop Time 1345   OT Time Calculation (min) 41 min   Activity Tolerance Patient tolerated treatment well   Behavior During Therapy [facility] for tasks assessed/performed      Past Medical History  Diagnosis Date  . Kidney stone   . Gout     Past Surgical History  Procedure Laterality Date  . Appendectomy      There were no vitals taken for this visit.  Visit Diagnosis:  S/P rotator cuff repair  Pain in joint, shoulder region, right  Muscle weakness (generalized)  Decreased range of motion of right shoulder      Subjective Assessment - 04/10/14 1304    Symptoms S: The only pain I have now is when you move it. I feel like I could go back to regular duty at work.    Currently in Pain? No/denies            OT Treatments/Exercises (OP) - 04/10/14 1305    Shoulder Exercises: Supine   Protraction PROM;5 reps;Strengthening;Weights;15 reps   Protraction Weight (lbs) 2   Horizontal ABduction PROM;5 reps;Strengthening;Weights;15 reps   Horizontal ABduction Weight (lbs) 2   External Rotation PROM;5 reps;Strengthening;Weights;15 reps   External Rotation Weight (lbs) 2   Internal Rotation PROM;5 reps;Strengthening;Weights;15 reps   Internal Rotation Weight (lbs) 2   Flexion PROM;5 reps;Strengthening;Weights;15 reps   Shoulder Flexion Weight (lbs) 2   ABduction PROM;5 reps;Strengthening;Weights;15 reps   Shoulder ABduction Weight (lbs) 2   Shoulder Exercises: Prone   Other Prone Exercises hughston exercises 5 positions, 15 reps 2# weight   Shoulder Exercises: Standing   Protraction Strengthening;Weights;15 reps   Protraction Weight (lbs) 2   Horizontal ABduction Strengthening;Weights;15  reps   Horizontal ABduction Weight (lbs) 2   External Rotation Strengthening;Weights;15 reps   External Rotation Weight (lbs) 2   Internal Rotation Strengthening;Weights;15 reps   Internal Rotation Weight (lbs) 2   Flexion Strengthening;Weights;15 reps   Shoulder Flexion Weight (lbs) 2   ABduction Strengthening;Weights;15 reps   Shoulder ABduction Weight (lbs) 2   Shoulder Exercises: ROM/Strengthening   UBE (Upper Arm Bike) Level 3, 3' forward 3' reverse   Cybex Press 3 plate;15 reps   Cybex Row 3 plate;15 reps   X to V Arms 15X with 2# weight   Proximal Shoulder Strengthening, Supine 15X with 2# weight   Proximal Shoulder Strengthening, Seated 15X with 2# weight   Manual Therapy   Manual Therapy Myofascial release   Myofascial Release Myofascial release and manual stretching to right bicep, upper arm, trapezius, and scapularis region to decrease fascial restrictions and increase range of motion in a pain free zone.             OT Short Term Goals - 04/10/14 1323    OT SHORT TERM GOAL #1   Title Patient will be educated on HEP.    Status Achieved   OT SHORT TERM GOAL #2   Title Patient will decrease pain to 4/10 or less during daily tasks.    Status Achieved   OT SHORT TERM GOAL #3   Title Patient will decrease fascial restrictions from mod to min amount.  Time 6   Period Weeks   Status On-going   OT SHORT TERM GOAL #4   Title Patient will increase AROM to [facility] to increase ability to complete daily tasks above shoulder level.   Status Achieved   OT SHORT TERM GOAL #5   Title Patient will increase strength to 3/5 to increase ability to use right arm during work tasks, including reaching and carrying objects.    Status Achieved          OT Long Term Goals - 04/10/14 1323    OT LONG TERM GOAL #1   Title Patient will return to highest level of independence during B/IADL tasks.    Time 12   Period Weeks   Status On-going   OT LONG TERM GOAL #2   Title Patient  will decrease pain to 2/10 or less to increase ability to sleep at night.    Time 12   Period Weeks   Status On-going   OT LONG TERM GOAL #3   Title Patient will decrease fascial restrictions to trace amount or less.   Time 12   Period Weeks   Status On-going   OT LONG TERM GOAL #4   Title AROM to WNL to increase ability to complete daily and work tasks above shoulder level.    Status Achieved   OT LONG TERM GOAL #5   Title Patient will increase strength to 5/5 to increase ability to use right arm during work tasks, including reaching, lifting, and carrying heavy objects.   Time 12   Period Weeks   Status On-going          Plan - 04/10/14 1340    Clinical Impression Statement A: Increase strengthening repetitons to 15 in supine & standing. Increased UBE to level 3. Patient tolerated treatment well. Patient reports decreased pain when using arm during daily tasks.    Plan P: Add sidelying exercises. Increase Cybex row/press to 4# plate.         Problem List Patient Active Problem List   Diagnosis Date Noted  . S/P rotator cuff repair 02/10/2014  . Pain in joint, shoulder region 02/10/2014  . Muscle weakness (generalized) 02/10/2014  . Decreased range of motion of right shoulder 02/10/2014      [name]. OT Student  04/10/2014, 1:47 PM

## 2014-04-10 NOTE — Addendum Note (Signed)
Encounter addended by: Debby Bud, OT on: 04/10/2014  1:54 PM<BR>     Documentation filed: Fast Note

## 2014-04-13 ENCOUNTER — Ambulatory Visit (HOSPITAL_COMMUNITY)
Admission: RE | Admit: 2014-04-13 | Discharge: 2014-04-13 | Disposition: A | Discharge status: Home / Self Care | Payer: PRIVATE HEALTH INSURANCE | Source: Ambulatory Visit | Attending: Family Medicine | Admitting: Family Medicine

## 2014-04-13 ENCOUNTER — Encounter (HOSPITAL_COMMUNITY): Payer: Self-pay

## 2014-04-13 DIAGNOSIS — M25511 Pain in right shoulder: Secondary | ICD-10-CM

## 2014-04-13 DIAGNOSIS — Z9889 Other specified postprocedural states: Secondary | ICD-10-CM

## 2014-04-13 DIAGNOSIS — Z5189 Encounter for other specified aftercare: Secondary | ICD-10-CM | POA: Diagnosis not present

## 2014-04-13 DIAGNOSIS — M6281 Muscle weakness (generalized): Secondary | ICD-10-CM

## 2014-04-13 DIAGNOSIS — M25611 Stiffness of right shoulder, not elsewhere classified: Secondary | ICD-10-CM

## 2014-04-13 NOTE — Therapy (Signed)
Occupational Therapy Treatment  Patient Details  Name: Marcus Chen MRN: [id number] Date of Birth: [birth year]  Encounter Date: 04/13/2014      OT End of Session - 04/13/14 1158    Visit Number 26   Number of Visits 32   Date for OT Re-Evaluation 04/27/14   OT Start Time 1104   OT Stop Time 1152   OT Time Calculation (min) 48 min   Activity Tolerance Patient tolerated treatment well   Behavior During Therapy [facility] for tasks assessed/performed      Past Medical History  Diagnosis Date  . Kidney stone   . Gout     Past Surgical History  Procedure Laterality Date  . Appendectomy      There were no vitals taken for this visit.  Visit Diagnosis:  S/P rotator cuff repair  Pain in joint, shoulder region, right  Muscle weakness (generalized)  Decreased range of motion of right shoulder      Subjective Assessment - 04/13/14 1105    Symptoms "that prednisone as actually making me feel really good, but tis starting to creep back in."   Limitations PROM for 4 weeks (03/10/14). Progress to AAROM, AROM, and strengthening as tolerated.    Currently in Pain? No/denies            OT Treatments/Exercises (OP) - 04/13/14 1105    Shoulder Exercises: Supine   Protraction PROM;5 reps;Strengthening;Weights;15 reps   Protraction Weight (lbs) 2   Horizontal ABduction PROM;5 reps;Strengthening;Weights;15 reps   Horizontal ABduction Weight (lbs) 2   External Rotation PROM;5 reps;Strengthening;Weights;15 reps   External Rotation Weight (lbs) 2   Internal Rotation PROM;5 reps;Strengthening;Weights;15 reps   Internal Rotation Weight (lbs) 2   Flexion PROM;5 reps;Strengthening;Weights;15 reps   Shoulder Flexion Weight (lbs) 2   ABduction PROM;5 reps;Strengthening;Weights;15 reps  +3 reps with increased IR - pt indicated good stretch rotati   Shoulder ABduction Weight (lbs) 2   Shoulder Exercises: Prone   Other Prone Exercises hughston exercises 5 positions, 15 reps 2# weight   Shoulder Exercises: Sidelying   External Rotation 15 reps;Weights;Strengthening   External Rotation Weight (lbs) 2   Internal Rotation Strengthening;15 reps;Weights   Internal Rotation Weight (lbs) 2   ABduction Strengthening;15 reps   ABduction Weight (lbs) 2   Other Sidelying Exercises Horizontal abduction 15 reps, 2 lbs   Shoulder Exercises: ROM/Strengthening   Cybex Press 15 reps  4 plate   Cybex Row 15 reps  4 plate   Proximal Shoulder Strengthening, Supine 15X with 2# weight  2 reps   Ball on Wall 1 min in flexiona dn 1 min abduction, with weighted green ball   Manual Therapy   Manual Therapy Myofascial release   Myofascial Release Myofascial release and manual stretching to right bicep, upper arm, trapezius, and scapularis region to decrease fascial restrictions and increase range of motion in a pain free zone.               OT Short Term Goals - 04/13/14 1209    OT SHORT TERM GOAL #1   Title Patient will be educated on HEP.    Status Achieved   OT SHORT TERM GOAL #2   Title Patient will decrease pain to 4/10 or less during daily tasks.    Status Achieved   OT SHORT TERM GOAL #3   Title Patient will decrease fascial restrictions from mod to min amount.    Status On-going   OT SHORT TERM GOAL #4   Title  Patient will increase AROM to [facility] to increase ability to complete daily tasks above shoulder level.   Status Achieved   OT SHORT TERM GOAL #5   Title Patient will increase strength to 3/5 to increase ability to use right arm during work tasks, including reaching and carrying objects.    Status Achieved          OT Long Term Goals - 04/13/14 1209    OT LONG TERM GOAL #1   Title Patient will return to highest level of independence during B/IADL tasks.    Status On-going   OT LONG TERM GOAL #2   Title Patient will decrease pain to 2/10 or less to increase ability to sleep at night.    Status On-going   OT LONG TERM GOAL #3   Title Patient will decrease  fascial restrictions to trace amount or less.   Status On-going   OT LONG TERM GOAL #4   Title AROM to WNL to increase ability to complete daily and work tasks above shoulder level.    Status Achieved   OT LONG TERM GOAL #5   Title Patient will increase strength to 5/5 to increase ability to use right arm during work tasks, including reaching, lifting, and carrying heavy objects.   Status On-going          Plan - 04/13/14 1159    Clinical Impression Statement Pt with good tolerance of exercises, but continued pain with abduction movements.  Pt felt better stretch in abduction.  Introduced sidelying exercises, and pt toelrated well. Indicated a good stretch of muscles he has been unable to work out prior.  Increased weight with cybex press/row, with good tolerance and fatigue.  Added ball on wal in flexion and abduction - pt tolerated well, but had some difficulty with exercise. At end of session, pt indicated increased tightness in upper trap region - attempted upper trap and levator scapula stretch with min effect.     Plan Continue sidelying and ball on wall exercises.  Follow up on shoulder/nick tightness - add stretches/MFR as needed.        Problem List Patient Active Problem List   Diagnosis Date Noted  . S/P rotator cuff repair 02/10/2014  . Pain in joint, shoulder region 02/10/2014  . Muscle weakness (generalized) 02/10/2014  . Decreased range of motion of right shoulder 02/10/2014     Bea Graff Shevelle Smither, MS, OTR/L [place] [phone] 04/13/2014, 12:12 PM

## 2014-04-15 ENCOUNTER — Ambulatory Visit (HOSPITAL_COMMUNITY)
Admission: RE | Admit: 2014-04-15 | Discharge: 2014-04-15 | Disposition: A | Discharge status: Home / Self Care | Payer: PRIVATE HEALTH INSURANCE | Source: Ambulatory Visit | Attending: Family Medicine | Admitting: Family Medicine

## 2014-04-15 ENCOUNTER — Encounter (HOSPITAL_COMMUNITY): Payer: Self-pay

## 2014-04-15 DIAGNOSIS — M25611 Stiffness of right shoulder, not elsewhere classified: Secondary | ICD-10-CM

## 2014-04-15 DIAGNOSIS — M25511 Pain in right shoulder: Secondary | ICD-10-CM

## 2014-04-15 DIAGNOSIS — M6281 Muscle weakness (generalized): Secondary | ICD-10-CM

## 2014-04-15 DIAGNOSIS — Z9889 Other specified postprocedural states: Secondary | ICD-10-CM

## 2014-04-15 DIAGNOSIS — Z5189 Encounter for other specified aftercare: Secondary | ICD-10-CM | POA: Diagnosis not present

## 2014-04-15 NOTE — Therapy (Signed)
Occupational Therapy Treatment  Patient Details  Name: Marcus Chen MRN: [id number] Date of Birth: 1965-07-25  Encounter Date: 04/15/2014      OT End of Session - 04/15/14 1136    Visit Number 27   Number of Visits 32   Date for OT Re-Evaluation 04/27/14   OT Start Time 1107   OT Stop Time 1153   OT Time Calculation (min) 46 min   Activity Tolerance Patient tolerated treatment well   Behavior During Therapy [facility] for tasks assessed/performed      Past Medical History  Diagnosis Date  . Kidney stone   . Gout     Past Surgical History  Procedure Laterality Date  . Appendectomy      There were no vitals taken for this visit.  Visit Diagnosis:  S/P rotator cuff repair  Pain in joint, shoulder region, right  Muscle weakness (generalized)  Decreased range of motion of right shoulder      Subjective Assessment - 04/15/14 1108    Symptoms "Just a little tightness, but thats to be expected."   Currently in Pain? No/denies            OT Treatments/Exercises (OP) - 04/15/14 1108    Shoulder Exercises: Supine   Protraction PROM;5 reps;Strengthening;Weights;15 reps   Protraction Weight (lbs) 2   Horizontal ABduction PROM;5 reps;Strengthening;Weights;15 reps   Horizontal ABduction Weight (lbs) 2   External Rotation PROM;5 reps;Strengthening;Weights;15 reps   External Rotation Weight (lbs) 2   Internal Rotation PROM;5 reps;Strengthening;Weights;15 reps   Internal Rotation Weight (lbs) 2   Flexion PROM;5 reps;Strengthening;Weights;15 reps   Shoulder Flexion Weight (lbs) 2   ABduction PROM;5 reps;Strengthening;Weights;15 reps   Shoulder ABduction Weight (lbs) 2   Shoulder Exercises: Prone   Other Prone Exercises Hugh[place] exercises 5 positions, 15 reps 2# weight   Shoulder Exercises: Sidelying   External Rotation Strengthening;15 reps   External Rotation Weight (lbs) 2   Internal Rotation Strengthening;15 reps;Weights   Internal Rotation Weight (lbs) 2   ABduction Strengthening;15 reps   ABduction Weight (lbs) 2   Other Sidelying Exercises Horizontal abduction 15 reps, 2 lbs   Shoulder Exercises: Standing   Protraction Strengthening;Weights;15 reps   Protraction Weight (lbs) 2   Horizontal ABduction Strengthening;Weights;15 reps   Horizontal ABduction Weight (lbs) 2   External Rotation Strengthening;Weights;15 reps   External Rotation Weight (lbs) 2   Internal Rotation Strengthening;Weights;15 reps   Internal Rotation Weight (lbs) 2   Flexion Strengthening;Weights;15 reps   Shoulder Flexion Weight (lbs) 2   ABduction Strengthening;Weights;15 reps   Shoulder ABduction Weight (lbs) 2   Shoulder Exercises: ROM/Strengthening   UBE (Upper Arm Bike) Level 3, 3' forward 3' reverse   Proximal Shoulder Strengthening, Seated 15X with 2# weight  with 1 rest break this session   Ball on Wall 1 min in flexion and 1 min abduction, with weighted green ball  increased pain and fatigue this session   Manual Therapy   Manual Therapy Myofascial release   Myofascial Release   Myofascial release and manual stretching to right bicep, upper arm, trapezius, and scapularis region to decrease fascial restrictions and increase range of motion in a pain free zone.                OT Short Term Goals - 04/15/14 1135    OT SHORT TERM GOAL #1   Title Patient will be educated on HEP.    Status Achieved   OT SHORT TERM GOAL #2   Title  Patient will decrease pain to 4/10 or less during daily tasks.    Status Achieved   OT SHORT TERM GOAL #3   Title Patient will decrease fascial restrictions from mod to min amount.    Status On-going   OT SHORT TERM GOAL #4   Title Patient will increase AROM to [facility] to increase ability to complete daily tasks above shoulder level.   Status Achieved   OT SHORT TERM GOAL #5   Title Patient will increase strength to 3/5 to increase ability to use right arm during work tasks, including reaching and carrying objects.    Status  Achieved          OT Long Term Goals - 04/15/14 1135    OT LONG TERM GOAL #1   Title Patient will return to highest level of independence during B/IADL tasks.    Status On-going   OT LONG TERM GOAL #2   Title Patient will decrease pain to 2/10 or less to increase ability to sleep at night.    Status On-going   OT LONG TERM GOAL #3   Title Patient will decrease fascial restrictions to trace amount or less.   Status On-going   OT LONG TERM GOAL #4   Title AROM to WNL to increase ability to complete daily and work tasks above shoulder level.    Status Achieved   OT LONG TERM GOAL #5   Title Patient will increase strength to 5/5 to increase ability to use right arm during work tasks, including reaching, lifting, and carrying heavy objects.   Status On-going          Plan - 04/15/14 1149    Clinical Impression Statement Pt indicates sleeping 'funny' last night and having increased pain and tightness this AM.  Continue supine 2# with good tolerance. 2# tolerance has improved significanly for pt.  continued sidelying and standing exercises, with some increased pain this session in abduction.  continued ball on wall exercise - pt had increased pain and fatigue over previous same exercises in previous session.  Ended session with UBE, and pt indicated a good stretch.    Minimal cervical neck tightness noted this session.     Plan Follow-up on sorenss with back-to-back sessions.  Increase supine exercises to 3#.        Problem List Patient Active Problem List   Diagnosis Date Noted  . S/P rotator cuff repair 02/10/2014  . Pain in joint, shoulder region 02/10/2014  . Muscle weakness (generalized) 02/10/2014  . Decreased range of motion of right shoulder 02/10/2014     Bea Graff Linnette Panella, MS, OTR/L [facility] [phone] 04/15/2014, 11:54 AM

## 2014-04-16 ENCOUNTER — Ambulatory Visit (HOSPITAL_COMMUNITY)
Admission: RE | Admit: 2014-04-16 | Discharge: 2014-04-16 | Disposition: A | Discharge status: Home / Self Care | Payer: PRIVATE HEALTH INSURANCE | Source: Ambulatory Visit | Attending: Family Medicine | Admitting: Family Medicine

## 2014-04-16 ENCOUNTER — Encounter (HOSPITAL_COMMUNITY): Payer: Self-pay

## 2014-04-16 DIAGNOSIS — Z5189 Encounter for other specified aftercare: Secondary | ICD-10-CM | POA: Diagnosis not present

## 2014-04-16 DIAGNOSIS — M6281 Muscle weakness (generalized): Secondary | ICD-10-CM

## 2014-04-16 DIAGNOSIS — M25611 Stiffness of right shoulder, not elsewhere classified: Secondary | ICD-10-CM

## 2014-04-16 DIAGNOSIS — M25511 Pain in right shoulder: Secondary | ICD-10-CM

## 2014-04-16 DIAGNOSIS — Z9889 Other specified postprocedural states: Secondary | ICD-10-CM

## 2014-04-16 NOTE — Therapy (Signed)
Occupational Therapy Treatment  Patient Details  Name: Marcus Chen MRN: [id number] Date of Birth: 1965-11-13  Encounter Date: 04/16/2014      OT End of Session - 04/16/14 1709    Visit Number 28   Number of Visits 32   Date for OT Re-Evaluation 04/27/14   OT Start Time 1518   OT Stop Time 1559   OT Time Calculation (min) 41 min   Activity Tolerance Patient tolerated treatment well      Past Medical History  Diagnosis Date  . Kidney stone   . Gout     Past Surgical History  Procedure Laterality Date  . Appendectomy      There were no vitals taken for this visit.  Visit Diagnosis:  S/P rotator cuff repair  Pain in joint, shoulder region, right  Muscle weakness (generalized)  Decreased range of motion of right shoulder      Subjective Assessment - 04/16/14 1515    Symptoms "I'm sore, but I was crawling around for about 3 ours yesterday with a gas line."   Limitations PROM for 4 weeks (03/10/14). Progress to AAROM, AROM, and strengthening as tolerated.    Currently in Pain? No/denies            OT Treatments/Exercises (OP) - 04/16/14 1520    Shoulder Exercises: Supine   Protraction PROM;5 reps;Strengthening;Weights;12 reps   Protraction Weight (lbs) 3   Horizontal ABduction PROM;5 reps;Strengthening;Weights;12 reps   Horizontal ABduction Weight (lbs) 3   External Rotation PROM;5 reps;Strengthening;Weights;12 reps   External Rotation Weight (lbs) 3   Internal Rotation PROM;5 reps;Strengthening;Weights;12 reps   Internal Rotation Weight (lbs) 3   Flexion PROM;5 reps;Strengthening;Weights;12 reps   Shoulder Flexion Weight (lbs) 3   ABduction PROM;5 reps;Strengthening;Weights;10 reps   Shoulder ABduction Weight (lbs) 3   Shoulder Exercises: Prone   Other Prone Exercises Hughston exercises 5 positions, 10 reps 3# weight   Shoulder Exercises: Sidelying   External Rotation Strengthening;10 reps   External Rotation Weight (lbs) 3   Internal Rotation  Strengthening;10 reps   Internal Rotation Weight (lbs) 3   ABduction Strengthening;12 reps   ABduction Weight (lbs) 3   Other Sidelying Exercises Horizontal abduction 10 reps, 3 lbs   Shoulder Exercises: Standing   Protraction Strengthening;10 reps   Protraction Weight (lbs) 3   Horizontal ABduction Strengthening;10 reps   Horizontal ABduction Weight (lbs) 3   External Rotation Strengthening;10 reps   External Rotation Weight (lbs) 3   Internal Rotation Strengthening;10 reps   Internal Rotation Weight (lbs) 3   Flexion Strengthening;10 reps   Shoulder Flexion Weight (lbs) 3   ABduction Strengthening;10 reps   Shoulder ABduction Weight (lbs) 3   Shoulder Exercises: ROM/Strengthening   Cybex Press 15 reps  5 plate   Cybex Row 15 reps  5 plate   Proximal Shoulder Strengthening, Seated 12x with 3# weight   Ball on Wall 1 min in flexion and 1 min abduction, with weighted green ball   Manual Therapy   Manual Therapy Myofascial release   Myofascial Release Myofascial release and manual stretching to right bicep, upper arm, trapezius, and scapularis region to decrease fascial restrictions and increase range of motion in a pain free zone.               OT Short Term Goals - 04/16/14 1712    OT SHORT TERM GOAL #1   Title Patient will be educated on HEP.    Status Achieved   OT SHORT TERM  GOAL #2   Title Patient will decrease pain to 4/10 or less during daily tasks.    Status Achieved   OT SHORT TERM GOAL #3   Title Patient will decrease fascial restrictions from mod to min amount.    Status On-going   OT SHORT TERM GOAL #4   Title Patient will increase AROM to [facility] to increase ability to complete daily tasks above shoulder level.   Status Achieved   OT SHORT TERM GOAL #5   Title Patient will increase strength to 3/5 to increase ability to use right arm during work tasks, including reaching and carrying objects.    Status Achieved          OT Long Term Goals - 04/16/14  1712    OT LONG TERM GOAL #1   Title Patient will return to highest level of independence during B/IADL tasks.    Status On-going   OT LONG TERM GOAL #2   Title Patient will decrease pain to 2/10 or less to increase ability to sleep at night.    Status On-going   OT LONG TERM GOAL #3   Title Patient will decrease fascial restrictions to trace amount or less.   Status On-going   OT LONG TERM GOAL #4   Title AROM to WNL to increase ability to complete daily and work tasks above shoulder level.    Status Achieved   OT LONG TERM GOAL #5   Title Patient will increase strength to 5/5 to increase ability to use right arm during work tasks, including reaching, lifting, and carrying heavy objects.   Status On-going          Plan - 04/16/14 1709    Clinical Impression Statement Increased to 3 lbs weights in supine this session.  Pt tolerated well, but with increased pain.  Completed 10 reps abduction in supine.  Pt continued with 3# weights for all strengthening exercises, stating he was ready (despite suggestions of increased pain).  Pt tolerated 3#, comleting 10 reps in standing. Continued ball on wall exercise, with good tolerance.  pt had increased tightness in upper trap region with continued exercises - provided manual to region to decrease pain and tightness.  Per request, provided pt with biofreeze sample at end of session. pt indicate he has been unble to purchase biofreeze, despite attemtps to find.     Plan Follow up on soreness from increase to 3#.  Add overhead lacing activity with wrist weight.        Problem List Patient Active Problem List   Diagnosis Date Noted  . S/P rotator cuff repair 02/10/2014  . Pain in joint, shoulder region 02/10/2014  . Muscle weakness (generalized) 02/10/2014  . Decreased range of motion of right shoulder 02/10/2014     Bea Graff Solash Tullo, MS, OTR/L [place] [phone] 04/16/2014, 5:14 PM

## 2014-04-20 ENCOUNTER — Ambulatory Visit (HOSPITAL_COMMUNITY)
Admission: RE | Admit: 2014-04-20 | Discharge: 2014-04-20 | Disposition: A | Discharge status: Home / Self Care | Payer: PRIVATE HEALTH INSURANCE | Source: Ambulatory Visit | Attending: Family Medicine | Admitting: Family Medicine

## 2014-04-20 ENCOUNTER — Encounter (HOSPITAL_COMMUNITY): Payer: Self-pay

## 2014-04-20 DIAGNOSIS — M6281 Muscle weakness (generalized): Secondary | ICD-10-CM

## 2014-04-20 DIAGNOSIS — Z9889 Other specified postprocedural states: Secondary | ICD-10-CM

## 2014-04-20 DIAGNOSIS — M25611 Stiffness of right shoulder, not elsewhere classified: Secondary | ICD-10-CM

## 2014-04-20 DIAGNOSIS — M25511 Pain in right shoulder: Secondary | ICD-10-CM

## 2014-04-20 DIAGNOSIS — Z5189 Encounter for other specified aftercare: Secondary | ICD-10-CM | POA: Diagnosis not present

## 2014-04-20 NOTE — Therapy (Signed)
Occupational Therapy Treatment  Patient Details  Name: Marcus Chen MRN: [id number] Date of Birth: [birth year]  Encounter Date: 04/20/2014      OT End of Session - 04/20/14 1203    Visit Number 29   Number of Visits 32   Date for OT Re-Evaluation 04/27/14   OT Start Time 1106   OT Stop Time 1154   OT Time Calculation (min) 48 min   Activity Tolerance Patient tolerated treatment well   Behavior During Therapy [facility] for tasks assessed/performed      Past Medical History  Diagnosis Date  . Kidney stone   . Gout     Past Surgical History  Procedure Laterality Date  . Appendectomy      There were no vitals taken for this visit.  Visit Diagnosis:  S/P rotator cuff repair  Pain in joint, shoulder region, right  Muscle weakness (generalized)  Decreased range of motion of right shoulder      Subjective Assessment - 04/20/14 1104    Symptoms "I actually wasn't too sore from here. but I was sore from crawling under the hosue.  that's finally easing off."   Limitations PROM for 4 weeks (03/10/14). Progress to AAROM, AROM, and strengthening as tolerated.    Currently in Pain? No/denies            OT Treatments/Exercises (OP) - 04/20/14 1107    Exercises   Exercises Shoulder   Shoulder Exercises: Supine   Protraction PROM;5 reps;Strengthening;Weights;12 reps   Protraction Weight (lbs) 3   Horizontal ABduction PROM;5 reps;Strengthening;Weights;12 reps   Horizontal ABduction Weight (lbs) 3   External Rotation PROM;5 reps;Strengthening;Weights;12 reps   External Rotation Weight (lbs) 3   Internal Rotation PROM;5 reps;Strengthening;Weights;12 reps   Internal Rotation Weight (lbs) 3   Flexion PROM;5 reps;Strengthening;Weights;12 reps   Shoulder Flexion Weight (lbs) 3   ABduction PROM;5 reps;Strengthening;Weights;10 reps   Shoulder ABduction Weight (lbs) 3   Shoulder Exercises: Prone   Other Prone Exercises [place] exercises 5 positions, 10 reps 3# weight   Shoulder Exercises: Sidelying   External Rotation Strengthening;10 reps   External Rotation Weight (lbs) 3   Internal Rotation Strengthening;10 reps   Internal Rotation Weight (lbs) 3   ABduction Strengthening;12 reps   ABduction Weight (lbs) 3   Other Sidelying Exercises Horizontal abduction 10 reps, 3 lbs   Shoulder Exercises: ROM/Strengthening   Over Head Lace 2:30, with 2# wrist weight   Proximal Shoulder Strengthening, Supine 12x with 3# weight  2 reps   Shoulder Exercises: Stretch   Corner Stretch 5 reps;10 seconds   Cross Chest Stretch 4 reps;10 seconds  with blue theraband   Wall Stretch - Flexion 3 reps;10 seconds  with blue theraband   Other Shoulder Stretches Horizontal abduction/extension 5 reps, 10 sec holds, with blue theraband   Manual Therapy   Manual Therapy Myofascial release   Myofascial Release Myofascial release and manual stretching to right bicep, upper arm, trapezius, and scapularis region to decrease fascial restrictions and increase range of motion in a pain free zone.             OT Education - 04/20/14 1202    Education provided Yes   Education Details blue theraband flexion, horizontal ab/adduction stretches; corner stretch   Person(s) Educated Patient   Methods Explanation;Demonstration   Comprehension Verbalized understanding;Returned demonstration          OT Short Term Goals - 04/20/14 1129    OT SHORT TERM GOAL #1   Title  Patient will be educated on HEP.    Status Achieved   OT SHORT TERM GOAL #2   Title Patient will decrease pain to 4/10 or less during daily tasks.    Status Achieved   OT SHORT TERM GOAL #3   Title Patient will decrease fascial restrictions from mod to min amount.    Status On-going   OT SHORT TERM GOAL #4   Title Patient will increase AROM to [facility] to increase ability to complete daily tasks above shoulder level.   Status Achieved   OT SHORT TERM GOAL #5   Title Patient will increase strength to 3/5 to increase  ability to use right arm during work tasks, including reaching and carrying objects.    Status Achieved          OT Long Term Goals - 04/20/14 1129    OT LONG TERM GOAL #1   Title Patient will return to highest level of independence during B/IADL tasks.    Status On-going   OT LONG TERM GOAL #2   Title Patient will decrease pain to 2/10 or less to increase ability to sleep at night.    Status On-going   OT LONG TERM GOAL #3   Title Patient will decrease fascial restrictions to trace amount or less.   Status On-going   OT LONG TERM GOAL #4   Title AROM to WNL to increase ability to complete daily and work tasks above shoulder level.    Status Achieved   OT LONG TERM GOAL #5   Title Patient will increase strength to 5/5 to increase ability to use right arm during work tasks, including reaching, lifting, and carrying heavy objects.   Status On-going          Plan - 04/20/14 1203    Clinical Impression Statement continued supine, sidelying and prone exercises with 3# weight this session.  pt continues to have increased tightness in anterior shoulder and deltoid regions.  Educated pt on corner stretch, and blue theraband flexion, horizontal abduction, and horizontal adduction stretches.  Pt demonstrated good form and verbalized good stretch and good understanding.  Will continue stretches at home and report on effectiveness of painand tightness. Added overhead lacing this session.  Pt tolerated well and surpassed goal of 2 min.     Plan Follow up on new stretches. Continue ball on wall and lacing   OT Home Exercise Plan AAROM exercises      10/23 added AROM standing     10/28 added IR towel strech      11/23 added corner stretch, theraband foexion/horizontal abduction/horizontal adduction stretches   Consulted and Agree with Plan of Care Patient        Problem List Patient Active Problem List   Diagnosis Date Noted  . S/P rotator cuff repair 02/10/2014  . Pain in joint,  shoulder region 02/10/2014  . Muscle weakness (generalized) 02/10/2014  . Decreased range of motion of right shoulder 02/10/2014    Bea Graff Akisha Sturgill, MS, OTR/L [place] [phone] 04/20/2014, 12:15 PM

## 2014-04-22 ENCOUNTER — Encounter (HOSPITAL_COMMUNITY): Payer: Self-pay

## 2014-04-22 ENCOUNTER — Ambulatory Visit (HOSPITAL_COMMUNITY)
Admission: RE | Admit: 2014-04-22 | Discharge: 2014-04-22 | Disposition: A | Discharge status: Home / Self Care | Payer: PRIVATE HEALTH INSURANCE | Source: Ambulatory Visit | Attending: Family Medicine | Admitting: Family Medicine

## 2014-04-22 DIAGNOSIS — Z9889 Other specified postprocedural states: Secondary | ICD-10-CM

## 2014-04-22 DIAGNOSIS — M6281 Muscle weakness (generalized): Secondary | ICD-10-CM

## 2014-04-22 DIAGNOSIS — M25611 Stiffness of right shoulder, not elsewhere classified: Secondary | ICD-10-CM

## 2014-04-22 DIAGNOSIS — M25511 Pain in right shoulder: Secondary | ICD-10-CM

## 2014-04-22 DIAGNOSIS — Z5189 Encounter for other specified aftercare: Secondary | ICD-10-CM | POA: Diagnosis not present

## 2014-04-22 NOTE — Therapy (Signed)
Occupational Therapy Treatment  Patient Details  Name: Marcus Chen MRN: [id number] Date of Birth: Oct 08, 1965  Encounter Date: 04/22/2014      OT End of Session - 04/22/14 1150    Visit Number 30   Number of Visits 32   Date for OT Re-Evaluation 04/27/14   OT Start Time 1109   OT Stop Time 1155   OT Time Calculation (min) 46 min   Activity Tolerance Patient tolerated treatment well   Behavior During Therapy [facility] for tasks assessed/performed      Past Medical History  Diagnosis Date  . Kidney stone   . Gout     Past Surgical History  Procedure Laterality Date  . Appendectomy      There were no vitals taken for this visit.  Visit Diagnosis:  S/P rotator cuff repair  Pain in joint, shoulder region, right  Muscle weakness (generalized)  Decreased range of motion of right shoulder      Subjective Assessment - 04/22/14 1105    Symptoms "No pain right now! But the day's early."   Currently in Pain? No/denies            OT Treatments/Exercises (OP) - 04/22/14 1110    Shoulder Exercises: Supine   Protraction PROM;5 reps;Strengthening;Weights;12 reps   Protraction Weight (lbs) 3   Horizontal ABduction PROM;5 reps;Strengthening;Weights;12 reps   Horizontal ABduction Weight (lbs) 3   External Rotation PROM;5 reps;Strengthening;Weights;12 reps   External Rotation Weight (lbs) 3   Internal Rotation PROM;5 reps;Strengthening;Weights;12 reps   Internal Rotation Weight (lbs) 3   Flexion PROM;5 reps;Strengthening;Weights;12 reps   Shoulder Flexion Weight (lbs) 3   ABduction PROM;5 reps;Strengthening;Weights;10 reps   Shoulder ABduction Weight (lbs) 3   Shoulder Exercises: Sidelying   External Rotation Strengthening;10 reps   External Rotation Weight (lbs) 3   Internal Rotation Strengthening;10 reps   Internal Rotation Weight (lbs) 3   ABduction Strengthening;10 reps   ABduction Weight (lbs) 3   Other Sidelying Exercises Horizontal abduction 10 reps, 3 lbs   Shoulder Exercises: ROM/Strengthening   UBE (Upper Arm Bike) Level 3, 3' forward 3' reverse   Cybex Press 15 reps  5 plate   Cybex Row 15 reps  5 plate   Ball on Wall 1 min in flexion and 1 min abduction, with weighted green ball   Manual Therapy   Manual Therapy Myofascial release   Myofascial Release Myofascial release and manual stretching to right bicep, upper arm, trapezius, and scapularis region to decrease fascial restrictions and increase range of motion in a pain free zone.               OT Short Term Goals - 04/22/14 1133    OT SHORT TERM GOAL #1   Title Patient will be educated on HEP.    Status Achieved   OT SHORT TERM GOAL #2   Title Patient will decrease pain to 4/10 or less during daily tasks.    Status Achieved   OT SHORT TERM GOAL #3   Title Patient will decrease fascial restrictions from mod to min amount.    Status On-going   OT SHORT TERM GOAL #4   Title Patient will increase AROM to [facility] to increase ability to complete daily tasks above shoulder level.   Status Achieved   OT SHORT TERM GOAL #5   Title Patient will increase strength to 3/5 to increase ability to use right arm during work tasks, including reaching and carrying objects.    Status Achieved  OT Long Term Goals - 04/22/14 1133    OT LONG TERM GOAL #1   Title Patient will return to highest level of independence during B/IADL tasks.    Status On-going   OT LONG TERM GOAL #2   Title Patient will decrease pain to 2/10 or less to increase ability to sleep at night.    Status On-going   OT LONG TERM GOAL #3   Title Patient will decrease fascial restrictions to trace amount or less.   Status On-going   OT LONG TERM GOAL #4   Title AROM to WNL to increase ability to complete daily and work tasks above shoulder level.    Status Achieved   OT LONG TERM GOAL #5   Title Patient will increase strength to 5/5 to increase ability to use right arm during work tasks, including reaching,  lifting, and carrying heavy objects.   Status On-going          Plan - 04/22/14 1150    Clinical Impression Statement Pt with increased pain during MFR and PROM this session - area of pain in upper arm region.  Pt still able to tolerate 3# weights for exercises. Increased pain from normal for all exercises. Continued UBE at end of session - pt indicated he typically feels less pain after UBE.     Plan Re-evaluate.        Problem List Patient Active Problem List   Diagnosis Date Noted  . S/P rotator cuff repair 02/10/2014  . Pain in joint, shoulder region 02/10/2014  . Muscle weakness (generalized) 02/10/2014  . Decreased range of motion of right shoulder 02/10/2014    Bea Graff Zykiria Bruening, MS, OTR/L [place] [phone] 04/22/2014, 11:54 AM

## 2014-04-27 ENCOUNTER — Encounter (HOSPITAL_COMMUNITY): Payer: Self-pay

## 2014-04-27 ENCOUNTER — Ambulatory Visit (HOSPITAL_COMMUNITY)
Admission: RE | Admit: 2014-04-27 | Discharge: 2014-04-27 | Disposition: A | Discharge status: Home / Self Care | Payer: PRIVATE HEALTH INSURANCE | Source: Ambulatory Visit | Attending: Family Medicine | Admitting: Family Medicine

## 2014-04-27 DIAGNOSIS — Z5189 Encounter for other specified aftercare: Secondary | ICD-10-CM | POA: Diagnosis not present

## 2014-04-27 DIAGNOSIS — M25511 Pain in right shoulder: Secondary | ICD-10-CM

## 2014-04-27 DIAGNOSIS — Z9889 Other specified postprocedural states: Secondary | ICD-10-CM

## 2014-04-27 DIAGNOSIS — M25611 Stiffness of right shoulder, not elsewhere classified: Secondary | ICD-10-CM

## 2014-04-27 DIAGNOSIS — M6281 Muscle weakness (generalized): Secondary | ICD-10-CM

## 2014-04-27 NOTE — Therapy (Signed)
Occupational Therapy Reassessment  Patient Details  Name: Marcus Chen MRN: [id number] Date of Birth: [birth year]  Encounter Date: 04/27/2014      OT End of Session - 04/27/14 1217    Visit Number 31   Number of Visits 70   OT Start Time 1107   OT Stop Time 1149   OT Time Calculation (min) 42 min   Activity Tolerance Patient tolerated treatment well   Behavior During Therapy [facility] for tasks assessed/performed      Past Medical History  Diagnosis Date  . Kidney stone   . Gout     Past Surgical History  Procedure Laterality Date  . Appendectomy      There were no vitals taken for this visit.  Visit Diagnosis:  No diagnosis found.      Subjective Assessment - 04/27/14 1212    Symptoms S: I have two muscle knots. One in my bicep and one in my tricep.    Special Tests FOTO score: 67/100   Currently in Pain? No/denies          [facility] OT Assessment - 04/27/14 1128    Assessment   Diagnosis S/P rotator cuff repair    Precautions   Precautions Shoulder   Type of Shoulder Precautions Strengthening as tolerated   Observation/Other Assessments   Observations Min fascial restrictions in right upper arm (bicep and tricep region).   Outcome Measures FOTO score: 67/100   AROM   Right Shoulder Flexion 180 Degrees  same last progress note   Right Shoulder ABduction 180 Degrees  same last progress note   Right Shoulder Internal Rotation 90 Degrees  same last progress note   Right Shoulder External Rotation 75 Degrees  on last progres note: 71   Strength   Left Shoulder Flexion 5/5  same at last progress note   Left Shoulder ABduction 5/5  same at last progress note   Left Shoulder Internal Rotation 5/5  last progress note: 4/5   Left Shoulder External Rotation 5/5  last progress note: 4/5              OT Short Term Goals - 04/27/14 1132    OT SHORT TERM GOAL #1   Title Patient will be educated on HEP.    Status Achieved   OT SHORT TERM GOAL #2   Title  Patient will decrease pain to 4/10 or less during daily tasks.    Status Achieved   OT SHORT TERM GOAL #3   Title Patient will decrease fascial restrictions from mod to min amount.    Status Achieved   OT SHORT TERM GOAL #4   Title Patient will increase AROM to [facility] to increase ability to complete daily tasks above shoulder level.   Status Achieved   OT SHORT TERM GOAL #5   Title Patient will increase strength to 3/5 to increase ability to use right arm during work tasks, including reaching and carrying objects.    Status Achieved          OT Long Term Goals - 04/27/14 1132    OT LONG TERM GOAL #1   Title Patient will return to highest level of independence during B/IADL tasks.    Status Not Met   OT LONG TERM GOAL #2   Title Patient will decrease pain to 2/10 or less to increase ability to sleep at night.    Status Achieved   OT LONG TERM GOAL #3   Title Patient will decrease fascial restrictions  to trace amount or less.   Status Not Met   OT LONG TERM GOAL #4   Title AROM to WNL to increase ability to complete daily and work tasks above shoulder level.    Status --   OT LONG TERM GOAL #5   Title Patient will increase strength to 5/5 to increase ability to use right arm during work tasks, including reaching, lifting, and carrying heavy objects.   Status Achieved          Plan - 04/27/14 1217    Clinical Impression Statement A: Reassessment and discharge completed this date. Patient met all STGs and 3/5 LTGs. Only goals not met were fascial restrictions, pain level during daily tasks, and returning to completing all normal daily activities. Patient has 5/5 strength and full range of motion. Patient feels he is ready to return to normal work duties. With the fascial restrictions, patient was educated on way to perform self myofascial release using hard ball and "The Stick." Recommended patient continue with HEP to keep remaining strength achieved during therapy sessions.     Plan P: D/C from therapy.         Problem List Patient Active Problem List   Diagnosis Date Noted  . S/P rotator cuff repair 02/10/2014  . Pain in joint, shoulder region 02/10/2014  . Muscle weakness (generalized) 02/10/2014  . Decreased range of motion of right shoulder 02/10/2014    OCCUPATIONAL THERAPY DISCHARGE SUMMARY  Visits from Start of Care: 31  Current functional level related to goals / functional outcomes: See assessment above and goals   Remaining deficits: See assessment above   Education / Equipment: Shoulder stretches, self myofascial release, shoulder strengthening, theraband exercises.  Plan: Patient agrees to discharge.  Patient goals were met. Patient is being discharged due to meeting the stated rehab goals.  ?????        Ailene Ravel, OTR/L,CBIS  [phone]  04/27/2014, 12:34 PM

## 2014-04-29 ENCOUNTER — Ambulatory Visit (HOSPITAL_COMMUNITY): Payer: PRIVATE HEALTH INSURANCE

## 2014-05-01 ENCOUNTER — Encounter (HOSPITAL_COMMUNITY): Payer: PRIVATE HEALTH INSURANCE

## 2014-05-04 ENCOUNTER — Ambulatory Visit (HOSPITAL_COMMUNITY): Payer: PRIVATE HEALTH INSURANCE

## 2014-05-06 ENCOUNTER — Ambulatory Visit (HOSPITAL_COMMUNITY): Payer: PRIVATE HEALTH INSURANCE

## 2014-11-09 ENCOUNTER — Ambulatory Visit (HOSPITAL_COMMUNITY): Payer: PRIVATE HEALTH INSURANCE | Attending: Orthopedic Surgery | Admitting: Occupational Therapy

## 2014-11-09 DIAGNOSIS — M629 Disorder of muscle, unspecified: Secondary | ICD-10-CM | POA: Insufficient documentation

## 2014-11-09 DIAGNOSIS — M6281 Muscle weakness (generalized): Secondary | ICD-10-CM | POA: Insufficient documentation

## 2014-11-12 ENCOUNTER — Encounter (HOSPITAL_COMMUNITY): Payer: PRIVATE HEALTH INSURANCE | Admitting: Occupational Therapy

## 2014-11-13 ENCOUNTER — Encounter (HOSPITAL_COMMUNITY): Payer: PRIVATE HEALTH INSURANCE | Admitting: Specialist

## 2014-11-16 ENCOUNTER — Ambulatory Visit (HOSPITAL_COMMUNITY): Payer: PRIVATE HEALTH INSURANCE | Admitting: Specialist

## 2014-11-18 ENCOUNTER — Telehealth (HOSPITAL_COMMUNITY): Payer: Self-pay

## 2014-11-18 ENCOUNTER — Ambulatory Visit (HOSPITAL_COMMUNITY): Payer: PRIVATE HEALTH INSURANCE

## 2014-11-18 NOTE — Telephone Encounter (Signed)
His schedule keeps changing and he will come in next week.

## 2014-11-19 ENCOUNTER — Ambulatory Visit (HOSPITAL_COMMUNITY): Payer: PRIVATE HEALTH INSURANCE | Admitting: Occupational Therapy

## 2014-11-24 ENCOUNTER — Encounter (HOSPITAL_COMMUNITY): Payer: Self-pay | Admitting: Occupational Therapy

## 2014-11-24 ENCOUNTER — Ambulatory Visit (HOSPITAL_COMMUNITY): Payer: PRIVATE HEALTH INSURANCE | Admitting: Occupational Therapy

## 2014-11-24 DIAGNOSIS — M6281 Muscle weakness (generalized): Secondary | ICD-10-CM

## 2014-11-24 DIAGNOSIS — S46911A Strain of unspecified muscle, fascia and tendon at shoulder and upper arm level, right arm, initial encounter: Secondary | ICD-10-CM

## 2014-11-24 DIAGNOSIS — M629 Disorder of muscle, unspecified: Secondary | ICD-10-CM

## 2014-11-24 DIAGNOSIS — S161XXA Strain of muscle, fascia and tendon at neck level, initial encounter: Secondary | ICD-10-CM

## 2014-11-24 DIAGNOSIS — M25511 Pain in right shoulder: Secondary | ICD-10-CM

## 2014-11-24 DIAGNOSIS — M6289 Other specified disorders of muscle: Secondary | ICD-10-CM

## 2014-11-24 NOTE — Therapy (Addendum)
[place], [place], [address] Phone: [phone]   Fax:  [phone]  Occupational Therapy Evaluation  Patient Details  Name: Marcus Chen MRN: [id number] Date of Birth: Feb 16, 1966 Referring Provider:  Vickey Huger, MD  Encounter Date: 11/24/2014      OT End of Session - 11/24/14 1221    Visit Number 1   Number of Visits 10   Date for OT Re-Evaluation 01/23/15  Mini reassess 12/22/2014   OT Start Time 0933   OT Stop Time 1011   OT Time Calculation (min) 38 min   Activity Tolerance Patient tolerated treatment well   Behavior During Therapy [facility] for tasks assessed/performed      Past Medical History  Diagnosis Date  . Kidney stone   . Gout     Past Surgical History  Procedure Laterality Date  . Appendectomy      There were no vitals filed for this visit.  Visit Diagnosis:  Right shoulder strain, initial encounter - Plan: Ot plan of care cert/re-cert  Pain in joint, shoulder region, right - Plan: Ot plan of care cert/re-cert  Muscle weakness (generalized) - Plan: Ot plan of care cert/re-cert  Tight fascia - Plan: Ot plan of care cert/re-cert  Neck strain, initial encounter - Plan: Ot plan of care cert/re-cert      Subjective Assessment - 11/24/14 1002    Subjective  S: I can do everything I need to, I just have a lot of pain   Pertinent History Pt is a 49 y/o male with a right neck and shoulder strain resulting from a training exercise at work on 08/15/2014. Pt was carrying heavy weight while completing heavy work and overhead tasks. Pt reports pain eased off slightly over vacation, however is still present whenever he moves his arm. Dr. Ronnie Derby referred pt to occupational therapy for evaluation and treatment.    Patient Stated Goals To be able to work and lift with no pain.    Currently in Pain? No/denies           [facility] OT Assessment - 11/24/14 0930    Assessment   Diagnosis R neck & shoulder  strain   Onset Date 08/16/14   Precautions   Precautions None   Balance Screen   Has the patient fallen in the past 6 months Yes   How many times? 2   Has the patient had a decrease in activity level because of a fear of falling?  No   Is the patient reluctant to leave their home because of a fear of falling?  No   Home  Environment   Family/patient expects to be discharged to: Private residence   Living Arrangements Spouse/significant other   Available Help at Discharge Family   Prior Function   Level of [place] with basic ADLs   Vocation Full time employment   Oceanographer department: Lifting, pulling, reaching, carrying heavy objects    ADL   ADL comments Pt is able to complete all his B/IADL tasks, however pain is present during all tasks involving movement. Work tasks are diffcult to complete due to pain during heavy work tasks.    Written Expression   Dominant Hand Right   Vision - History   Baseline Vision No visual deficits   Cognition   Overall Cognitive Status Within Functional Limits for tasks assessed   ROM / Strength   AROM / PROM / Strength AROM;PROM;Strength   Palpation  Palpation comment Pt has mod fascial restrictions along lower trapezius and scapularis regions.    AROM   Overall AROM  Within functional limits for tasks performed   Overall AROM Comments Assessed in standing, ER/IR adducted   AROM Assessment Site Shoulder;Cervical   Right/Left Shoulder Right   Right Shoulder Flexion 170 Degrees   Right Shoulder ABduction 173 Degrees   Right Shoulder Internal Rotation 90 Degrees   Right Shoulder External Rotation 90 Degrees   Cervical - Right Side Bend 10cm    Cervical - Left Side Bend 12cm   Cervical - Right Rotation 14cm   Cervical - Left Rotation 13cm   PROM   PROM Assessment Site --   Right/Left Shoulder --   Strength   Overall Strength Comments Assessed seated, ER/IR adducted    Strength Assessment Site Shoulder    Right/Left Shoulder Right   Right Shoulder Flexion 4+/5   Right Shoulder ABduction 4-/5   Right Shoulder Internal Rotation 4/5   Right Shoulder External Rotation 4/5            OT Education - 11/24/14 1002    Education provided Yes   Education Details Cervical AROM and Cervical stretches   Person(s) Educated Patient   Methods Explanation;Demonstration;Handout   Comprehension Verbalized understanding;Returned demonstration          OT Short Term Goals - 11/24/14 1231    OT SHORT TERM GOAL #1   Title Patient will be educated on HEP.    Time 4   Period Weeks   Status New   OT SHORT TERM GOAL #2   Title Pt will decrease pain to 1/10 or less during daily & work tasks.    Time 4   Period Weeks   Status New   OT SHORT TERM GOAL #3   Title Patient will decrease fascial restrictions from mod to min amount.    Time 4   Period Weeks   Status New   OT SHORT TERM GOAL #4   Title Pt will increase RUE strength to 5/5 to increase ability to complete work tasks.    OT SHORT TERM GOAL #5   Title Pt will return to highest level of functioning and independence during daily and work tasks.    Time 4   Period Weeks   Status New                  Plan - 11/24/14 1222    Clinical Impression Statement A: Pt is a 49 y/o male presenting with right neck and shoulder strain causing increased pain and fascial restrictions, and decreased strength, limiting ability to complete daily and work tasks to fullest ability. Pt reports he is able to complete all required tasks, although he experiences increased pain when using his right arm, however has not been required to complete any heavy lifting/carrying exercises for work since this incident. Pt has increased difficulty with shoulder extension & activities requiring him to reach behind him. Pt reports Dr. Ronnie Derby suggested coming to therapy 3x/week, OTR suggests coming 3x/week for 2 weeks then reducing visits to 2x/week for 2 additional  weeks.    Pt will benefit from skilled therapeutic intervention in order to improve on the following deficits (Retired) Pain;Decreased strength;Impaired UE functional use;Decreased mobility;Impaired flexibility;Increased fascial restricitons   Rehab Potential Good   OT Frequency 3x / week   OT Duration 2 weeks  2x/week for following 2 weeks.    OT Treatment/Interventions Self-care/ADL training;Cryotherapy;Patient/family education;Passive range of motion;Electrical  Stimulation;Moist Heat;Therapeutic exercise;Manual Therapy;Therapeutic activities   Plan P: Patient will benefit from skilled OT intervention to decrease pain and fascial restrictions, and improve strength in order to return to prior level of independence with all functional and heavy work activities. Treatment Plan:MFR and PROM/ AROM, strengthening, rhythmic stabilization exercises, postural stability,  proximal shoulder strengthening, scapular stability exercises.    OT Home Exercise Plan cervical AROM and stretching   Consulted and Agree with Plan of Care Patient        Problem List Patient Active Problem List   Diagnosis Date Noted  . S/P rotator cuff repair 02/10/2014  . Pain in joint, shoulder region 02/10/2014  . Muscle weakness (generalized) 02/10/2014  . Decreased range of motion of right shoulder 02/10/2014    [name], OTR/L  [phone]  11/24/2014, 4:43 PM  [place], [place], [address] Phone: [phone]   Fax:  [phone]

## 2014-11-24 NOTE — Patient Instructions (Signed)
  AROM: Lateral Neck Flexion   Slowly tilt head toward one shoulder, then the other.  Repeat __10__ times per set. Do __1__ sets per session. Do _1-2___ sessions per day.  http://orth.exer.us/296   Copyright  VHI. All rights reserved.  AROM: Neck Extension   Bend head backward.  Repeat _10___ times per set. Do _1___ sets per session. Do _1-2___ sessions per day.  http://orth.exer.us/300   Copyright  VHI. All rights reserved.  AROM: Neck Flexion   Bend head forward. . Repeat __10__ times per set. Do __1__ sets per session. Do _1-2___ sessions per day.  http://orth.exer.us/298   Copyright  VHI. All rights reserved.  AROM: Neck Rotation   Turn head slowly to look over one shoulder, then the other.  Repeat _10___ times per set. Do __1__ sets per session. Do __1-2__ sessions per day.  http://orth.exer.us/294   Copyright  VHI. All rights reserved.    Flexibility: Neck Stretch   Grasp left arm above wrist and pull down across body while gently tilting head same direction. Hold for 3 seconds. Repeat 10 times.   Levator Scapula Stretch   Place left hand on same side shoulder blade. With other hand, gently stretch head down and away. Hold 3 seconds. Repeat 10 times.   Flexibility: Neck Stretch   Grasp left arm above wrist and pull down across body while gently tilting head same direction. Hold 3 seconds. Repeat 10 times.  Flexibility: Neck Retraction   Pull head straight back, keeping eyes and jaw level. *Give yourself a double chin.* Hold 3 seconds. Repeat 10 times.   Lower Cervical / Upper Thoracic Stretch   Clasp hands together in front with arms extended. Gently pull shoulder blades apart and bend head forward. Hold 3 seconds. Repeat 10 times.

## 2014-11-25 ENCOUNTER — Ambulatory Visit (HOSPITAL_COMMUNITY): Payer: PRIVATE HEALTH INSURANCE

## 2014-11-25 ENCOUNTER — Encounter (HOSPITAL_COMMUNITY): Payer: Self-pay

## 2014-11-25 DIAGNOSIS — M6289 Other specified disorders of muscle: Secondary | ICD-10-CM

## 2014-11-25 DIAGNOSIS — M6281 Muscle weakness (generalized): Secondary | ICD-10-CM

## 2014-11-25 DIAGNOSIS — M629 Disorder of muscle, unspecified: Secondary | ICD-10-CM

## 2014-11-25 NOTE — Therapy (Signed)
[place], [place], [address] Phone: [phone]   Fax:  [phone]  Occupational Therapy Treatment  Patient Details  Name: Marcus Chen MRN: [id number] Date of Birth: [birth year] Referring Provider:  Vickey Huger, MD  Encounter Date: 11/25/2014      OT End of Session - 11/25/14 1050    Visit Number 2   Number of Visits 10   Date for OT Re-Evaluation 01/23/15  Mini reassess 12/22/2014   OT Start Time 0936   OT Stop Time 1017   OT Time Calculation (min) 41 min   Activity Tolerance Patient tolerated treatment well   Behavior During Therapy [facility] for tasks assessed/performed      Past Medical History  Diagnosis Date  . Kidney stone   . Gout     Past Surgical History  Procedure Laterality Date  . Appendectomy      There were no vitals filed for this visit.  Visit Diagnosis:  Muscle weakness (generalized)  Tight fascia      Subjective Assessment - 11/25/14 1006    Subjective  S: I was going to a massage therapist when I first started to have issues and it helped some.   Currently in Pain? No/denies            [facility] OT Assessment - 11/25/14 1007    Assessment   Diagnosis R neck & shoulder strain   Precautions   Precautions None                  OT Treatments/Exercises (OP) - 11/25/14 1007    Exercises   Exercises Shoulder   Shoulder Exercises: Supine   Protraction PROM;5 reps   Horizontal ABduction PROM;5 reps;Strengthening;15 reps   Horizontal ABduction Weight (lbs) 2   External Rotation PROM;5 reps   Internal Rotation PROM;5 reps   Flexion PROM;5 reps   ABduction PROM;5 reps   Other Supine Exercises Arm circles; 2#; 5x forward and reverse   Shoulder Exercises: Sidelying   External Rotation Strengthening;10 reps   External Rotation Weight (lbs) 2   Other Sidelying Exercises Side-lying arm raise; 2#' 1 second raise 6" decent; 15X   Shoulder Exercises: ROM/Strengthening    Proximal Shoulder Strengthening, Supine 15X with 2#   Manual Therapy   Manual Therapy Myofascial release   Manual therapy comments Manual traction to right arm and cervical region.   Myofascial Release Myofascial realse and manual stretching right upper trapezius, medial and anterior deltoid and right side cervical region to decrease fascial restrictions and increase joint mobilty in a pain free zone.                OT Education - 11/25/14 1050    Education provided Yes   Education Details Patient given print out of OT evaluation   Person(s) Educated Patient   Methods Handout   Comprehension Verbalized understanding          OT Short Term Goals - 11/25/14 1138    OT SHORT TERM GOAL #1   Title Patient will be educated on HEP.    Time 4   Period Weeks   Status On-going   OT SHORT TERM GOAL #2   Title Pt will decrease pain to 1/10 or less during daily & work tasks.    Time 4   Period Weeks   Status On-going   OT SHORT TERM GOAL #3   Title Patient will decrease fascial restrictions from mod to min amount.  Time 4   Period Weeks   Status On-going   OT SHORT TERM GOAL #4   Title Pt will increase RUE strength to 5/5 to increase ability to complete work tasks.    Status On-going   OT SHORT TERM GOAL #5   Title Pt will return to highest level of functioning and independence during daily and work tasks.    Time 4   Period Weeks   Status On-going                  Plan - 11/25/14 1050    Clinical Impression Statement A: Initiated myofascial release, manulat stretching, and shoulder stability exercises. During passive stretching moderate amount of crepitation felt in right shoulder.    Plan P: Cont with shoulder strengthening exercises: Face Down Field Goal, standing field goal, straight arm push ups.        Problem List Patient Active Problem List   Diagnosis Date Noted  . S/P rotator cuff repair 02/10/2014  . Pain in joint, shoulder region  02/10/2014  . Muscle weakness (generalized) 02/10/2014  . Decreased range of motion of right shoulder 02/10/2014    Ailene Ravel, OTR/L,CBIS  [phone]  11/25/2014, 11:41 AM  [place], [place], [address] Phone: [phone]   Fax:  [phone]

## 2014-11-27 ENCOUNTER — Ambulatory Visit (HOSPITAL_COMMUNITY): Payer: PRIVATE HEALTH INSURANCE | Attending: Orthopedic Surgery

## 2014-11-27 ENCOUNTER — Encounter (HOSPITAL_COMMUNITY): Payer: Self-pay

## 2014-11-27 DIAGNOSIS — M25511 Pain in right shoulder: Secondary | ICD-10-CM | POA: Insufficient documentation

## 2014-11-27 DIAGNOSIS — M629 Disorder of muscle, unspecified: Secondary | ICD-10-CM | POA: Diagnosis present

## 2014-11-27 DIAGNOSIS — M6289 Other specified disorders of muscle: Secondary | ICD-10-CM

## 2014-11-27 DIAGNOSIS — M6281 Muscle weakness (generalized): Secondary | ICD-10-CM | POA: Insufficient documentation

## 2014-11-27 NOTE — Therapy (Signed)
[place], [place], [address] Phone: [phone]   Fax:  [phone]  Occupational Therapy Treatment  Patient Details  Name: Marcus Chen MRN: [id number] Date of Birth: [birth year] Referring Provider:  Vickey Huger, MD  Encounter Date: 11/27/2014      OT End of Session - 11/27/14 0851    Visit Number 3   Number of Visits 10   Date for OT Re-Evaluation 01/23/15  Mini reassess 12/22/2014   OT Start Time 0805   OT Stop Time 0847   OT Time Calculation (min) 42 min   Activity Tolerance Patient tolerated treatment well   Behavior During Therapy [facility] for tasks assessed/performed      Past Medical History  Diagnosis Date  . Kidney stone   . Gout     Past Surgical History  Procedure Laterality Date  . Appendectomy      There were no vitals filed for this visit.  Visit Diagnosis:  Muscle weakness (generalized)  Tight fascia  Pain in joint, shoulder region, right      Subjective Assessment - 11/27/14 0839    Subjective  S: My neck is fine today but my shoulder is just nagging me.    Currently in Pain? Yes   Pain Score 1    Pain Location Shoulder   Pain Orientation Right   Pain Descriptors / Indicators Nagging   Pain Type Acute pain            [facility] OT Assessment - 11/27/14 0840    Assessment   Diagnosis R neck & shoulder strain   Precautions   Precautions None                  OT Treatments/Exercises (OP) - 11/27/14 0840    Exercises   Exercises Shoulder   Shoulder Exercises: Supine   Protraction PROM;5 reps   Horizontal ABduction PROM;5 reps   External Rotation PROM;5 reps   Internal Rotation PROM;5 reps   Flexion PROM;5 reps   ABduction PROM;5 reps   Shoulder Exercises: Prone   Other Prone Exercises Face Down Field Goal; 15X; 2#   Shoulder Exercises: Sidelying   External Rotation Strengthening;15 reps   External Rotation Weight (lbs) 2   Other Sidelying Exercises  Side-lying arm raise; 2#' 1 second raise 6" decent; 15X   Shoulder Exercises: Standing   Other Standing Exercises Field Goal; 15X; 2#   Manual Therapy   Manual Therapy Myofascial release   Manual therapy comments Manual traction to Rt arm during passive stretching when completing flexion and abduction.   Myofascial Release Myofascial realse and manual stretching right upper trapezius, medial and anterior deltoid and right side cervical region to decrease fascial restrictions and increase joint mobilty in a pain free zone.                  OT Short Term Goals - 11/25/14 1138    OT SHORT TERM GOAL #1   Title Patient will be educated on HEP.    Time 4   Period Weeks   Status On-going   OT SHORT TERM GOAL #2   Title Pt will decrease pain to 1/10 or less during daily & work tasks.    Time 4   Period Weeks   Status On-going   OT SHORT TERM GOAL #3   Title Patient will decrease fascial restrictions from mod to min amount.    Time 4   Period Weeks  Status On-going   OT SHORT TERM GOAL #4   Title Pt will increase RUE strength to 5/5 to increase ability to complete work tasks.    Status On-going   OT SHORT TERM GOAL #5   Title Pt will return to highest level of functioning and independence during daily and work tasks.    Time 4   Period Weeks   Status On-going                  Plan - 11/27/14 8264    Clinical Impression Statement A: Added additional shoulder strengthening and stability exercises. patient tolerated well with some reports of tightness. Pt did report that tightness tended to decrease the more reps he completed. Completed manual therapy prone for better access to right left trapezius. Noticed a large trigger point on left medial scapula area.    Plan Cont with shoulder strengthening exercises. Add standing curl and behind the back with theraband        Problem List Patient Active Problem List   Diagnosis Date Noted  . S/P rotator cuff repair  02/10/2014  . Pain in joint, shoulder region 02/10/2014  . Muscle weakness (generalized) 02/10/2014  . Decreased range of motion of right shoulder 02/10/2014    Ailene Ravel, OTR/L,CBIS  [phone]  11/27/2014, 9:21 AM  [place], [place], [address] Phone: [phone]   Fax:  [phone]

## 2014-12-01 ENCOUNTER — Ambulatory Visit (HOSPITAL_COMMUNITY): Payer: PRIVATE HEALTH INSURANCE

## 2014-12-01 ENCOUNTER — Encounter (HOSPITAL_COMMUNITY): Payer: Self-pay

## 2014-12-01 DIAGNOSIS — M6281 Muscle weakness (generalized): Secondary | ICD-10-CM | POA: Diagnosis not present

## 2014-12-01 DIAGNOSIS — M6289 Other specified disorders of muscle: Secondary | ICD-10-CM

## 2014-12-01 DIAGNOSIS — M629 Disorder of muscle, unspecified: Secondary | ICD-10-CM

## 2014-12-01 NOTE — Therapy (Signed)
[place], [place], [address] Phone: [phone]   Fax:  [phone]  Occupational Therapy Treatment  Patient Details  Name: Marcus Chen MRN: [id number] Date of Birth: Sep 30, 1965 Referring Provider:  Vickey Huger, MD  Encounter Date: 12/01/2014      OT End of Session - 12/01/14 1036    Visit Number 4   Number of Visits 10   Date for OT Re-Evaluation 01/23/15  Mini reassess 12/22/2014   OT Start Time 0944   OT Stop Time 1032   OT Time Calculation (min) 48 min   Activity Tolerance Patient tolerated treatment well   Behavior During Therapy [facility] for tasks assessed/performed      Past Medical History  Diagnosis Date  . Kidney stone   . Gout     Past Surgical History  Procedure Laterality Date  . Appendectomy      There were no vitals filed for this visit.  Visit Diagnosis:  Muscle weakness (generalized)  Tight fascia      Subjective Assessment - 12/01/14 1009    Subjective  S: My neck and shoulder doesn't hurt until I move it a certain way. When I do certain movements my entire shoulder hurts.    Currently in Pain? No/denies            [facility] OT Assessment - 12/01/14 1010    Assessment   Diagnosis R neck & shoulder strain   Precautions   Precautions None                  OT Treatments/Exercises (OP) - 12/01/14 1010    Exercises   Exercises Shoulder   Shoulder Exercises: Prone   Other Prone Exercises Face Down Field Goal; 15X; 2#   Other Prone Exercises Hughston exercises; 5 positions; 2#; 15X   Shoulder Exercises: Standing   External Rotation Theraband;12 reps   Theraband Level (Shoulder External Rotation) Level 1 (Yellow)   External Rotation Limitations Behind back   Other Standing Exercises Standing Bicep Curl with shoulder abducted 90 degrees and performed with hand pronated and supinated; 12X; green band.   Shoulder Exercises: ROM/Strengthening   Other ROM/Strengthening  Exercises Prone; Scapular Raises; 15X; 2#   Modalities   Modalities Electrical Stimulation;Moist Heat   Moist Heat Therapy   Number Minutes Moist Heat 10 Minutes   Moist Heat Location Shoulder   Electrical Stimulation   Electrical Stimulation Location Right shoulder   Electrical Stimulation Action interferential   Electrical Stimulation Parameters 13.0 CV   Electrical Stimulation Goals Pain;Neuromuscular facilitation   Manual Therapy   Manual Therapy Myofascial release   Myofascial Release Myofascial release and manual stretching right upper trapezius, medial and anterior deltoid and right side cervical region to decrease fascial restrictions and increase joint mobilty in a pain free zone.                  OT Short Term Goals - 11/25/14 1138    OT SHORT TERM GOAL #1   Title Patient will be educated on HEP.    Time 4   Period Weeks   Status On-going   OT SHORT TERM GOAL #2   Title Pt will decrease pain to 1/10 or less during daily & work tasks.    Time 4   Period Weeks   Status On-going   OT SHORT TERM GOAL #3   Title Patient will decrease fascial restrictions from mod to min amount.    Time  4   Period Weeks   Status On-going   OT SHORT TERM GOAL #4   Title Pt will increase RUE strength to 5/5 to increase ability to complete work tasks.    Status On-going   OT SHORT TERM GOAL #5   Title Pt will return to highest level of functioning and independence during daily and work tasks.    Time 4   Period Weeks   Status On-going                  Plan - 12/01/14 1037    Clinical Impression Statement A: Added standing curl and ER behind back with theraband. Patient reported increased tightness with standing curl when completing with proper form. Started session with ES and moist heat after stretching and Myofascial release to decrease muscle tightness and spasms.    Plan P: Cont with shoulder strengthening exercises. Cont with sidelying exercises and prone  swimmer.        Problem List Patient Active Problem List   Diagnosis Date Noted  . S/P rotator cuff repair 02/10/2014  . Pain in joint, shoulder region 02/10/2014  . Muscle weakness (generalized) 02/10/2014  . Decreased range of motion of right shoulder 02/10/2014    Ailene Ravel, OTR/L,CBIS  [phone]  12/01/2014, 10:42 AM  [place], [place], [address] Phone: [phone]   Fax:  [phone]

## 2014-12-03 ENCOUNTER — Ambulatory Visit (HOSPITAL_COMMUNITY): Payer: PRIVATE HEALTH INSURANCE | Admitting: Occupational Therapy

## 2014-12-04 ENCOUNTER — Ambulatory Visit (HOSPITAL_COMMUNITY): Payer: PRIVATE HEALTH INSURANCE

## 2014-12-04 ENCOUNTER — Encounter (HOSPITAL_COMMUNITY): Payer: Self-pay

## 2014-12-04 DIAGNOSIS — M6289 Other specified disorders of muscle: Secondary | ICD-10-CM

## 2014-12-04 DIAGNOSIS — M6281 Muscle weakness (generalized): Secondary | ICD-10-CM | POA: Diagnosis not present

## 2014-12-04 DIAGNOSIS — M629 Disorder of muscle, unspecified: Secondary | ICD-10-CM

## 2014-12-04 NOTE — Therapy (Signed)
[place], [place], [address] Phone: [phone]   Fax:  [phone]  Occupational Therapy Treatment  Patient Details  Name: Marcus Chen MRN: [id number] Date of Birth: 1966-05-28 Referring Provider:  Vickey Huger, MD  Encounter Date: 12/04/2014      OT End of Session - 12/04/14 0911    Visit Number 5   Number of Visits 10   Date for OT Re-Evaluation 01/23/15  Mini reassess 12/22/2014   OT Start Time 0807   OT Stop Time 0845   OT Time Calculation (min) 38 min   Activity Tolerance Patient tolerated treatment well   Behavior During Therapy [facility] for tasks assessed/performed      Past Medical History  Diagnosis Date  . Kidney stone   . Gout     Past Surgical History  Procedure Laterality Date  . Appendectomy      There were no vitals filed for this visit.  Visit Diagnosis:  Tight fascia  Muscle weakness (generalized)      Subjective Assessment - 12/04/14 0824    Subjective  S: After last session I could barely hold a pen. You really target that one spot. But the next day it felt great.    Currently in Pain? No/denies            [facility] OT Assessment - 12/04/14 0826    Assessment   Diagnosis R neck & shoulder strain   Precautions   Precautions None                  OT Treatments/Exercises (OP) - 12/04/14 0826    Exercises   Exercises Shoulder   Shoulder Exercises: Supine   Protraction PROM;5 reps   Horizontal ABduction PROM;5 reps   External Rotation PROM;5 reps   Internal Rotation PROM;5 reps   Flexion PROM;5 reps   ABduction PROM;5 reps   Shoulder Exercises: Prone   Other Prone Exercises Face Down Field Goal; 15X; 2#   Other Prone Exercises Hughston exercises; 5 positions; 2#; 15X   Shoulder Exercises: Standing   Other Standing Exercises Standing Bicep Curl with shoulder abducted 90 degrees and performed with hand pronated and supinated; 12X; green band.   Other Standing  Exercises Standing Field Goal; 2#; 15X   Shoulder Exercises: ROM/Strengthening   UBE (Upper Arm Bike) Level 3 reverse 3'   Other ROM/Strengthening Exercises Prone; Scapular Raises; 15X; 2#   Manual Therapy   Manual Therapy Myofascial release   Myofascial Release Myofascial release and manual stretching right upper trapezius, medial and anterior deltoid and right side cervical region to decrease fascial restrictions and increase joint mobilty in a pain free zone.                  OT Short Term Goals - 11/25/14 1138    OT SHORT TERM GOAL #1   Title Patient will be educated on HEP.    Time 4   Period Weeks   Status On-going   OT SHORT TERM GOAL #2   Title Pt will decrease pain to 1/10 or less during daily & work tasks.    Time 4   Period Weeks   Status On-going   OT SHORT TERM GOAL #3   Title Patient will decrease fascial restrictions from mod to min amount.    Time 4   Period Weeks   Status On-going   OT SHORT TERM GOAL #4   Title Pt will increase RUE strength  to 5/5 to increase ability to complete work tasks.    Status On-going   OT SHORT TERM GOAL #5   Title Pt will return to highest level of functioning and independence during daily and work tasks.    Time 4   Period Weeks   Status On-going                  Plan - 12/04/14 0836    Clinical Impression Statement A: Pt states that he felt the really good the day after last session and feels like the exercises are really targeting the right area. Performing IR with yellow theraband was slightly easier this session.    Plan P: Cont with shoulder strengthening exercises that have been performed as those are targeting appropriate muscles in right shoulder. If time allows, add ball on the wall in abduction for 1-45minutes using red weighted ball. Attempt tricep dips as patient states this movement feels really tight still.         Problem List Patient Active Problem List   Diagnosis Date Noted  . S/P  rotator cuff repair 02/10/2014  . Pain in joint, shoulder region 02/10/2014  . Muscle weakness (generalized) 02/10/2014  . Decreased range of motion of right shoulder 02/10/2014    Ailene Ravel, OTR/L,CBIS  [phone]  12/04/2014, 9:14 AM  [place], [place], [address] Phone: [phone]   Fax:  [phone]

## 2014-12-07 ENCOUNTER — Encounter (HOSPITAL_COMMUNITY): Payer: PRIVATE HEALTH INSURANCE | Admitting: Specialist

## 2014-12-07 ENCOUNTER — Ambulatory Visit (HOSPITAL_COMMUNITY): Payer: PRIVATE HEALTH INSURANCE | Admitting: Specialist

## 2014-12-10 ENCOUNTER — Encounter (HOSPITAL_COMMUNITY): Payer: Self-pay

## 2014-12-10 ENCOUNTER — Ambulatory Visit (HOSPITAL_COMMUNITY): Payer: PRIVATE HEALTH INSURANCE

## 2014-12-10 DIAGNOSIS — M6289 Other specified disorders of muscle: Secondary | ICD-10-CM

## 2014-12-10 DIAGNOSIS — M629 Disorder of muscle, unspecified: Secondary | ICD-10-CM

## 2014-12-10 DIAGNOSIS — M6281 Muscle weakness (generalized): Secondary | ICD-10-CM

## 2014-12-10 NOTE — Therapy (Signed)
[place], [place], [address] Phone: [phone]   Fax:  [phone]  Occupational Therapy Treatment  Patient Details  Name: Marcus Chen MRN: [id number] Date of Birth: 1966-03-27 Referring Provider:  Vickey Huger, MD  Encounter Date: 12/10/2014      OT End of Session - 12/10/14 0924    Visit Number 6   Number of Visits 10   Date for OT Re-Evaluation 01/23/15  Mini reassess 12/22/2014   OT Start Time 0805   OT Stop Time 0845   OT Time Calculation (min) 40 min   Activity Tolerance Patient tolerated treatment well   Behavior During Therapy [facility] for tasks assessed/performed      Past Medical History  Diagnosis Date  . Kidney stone   . Gout     Past Surgical History  Procedure Laterality Date  . Appendectomy      There were no vitals filed for this visit.  Visit Diagnosis:  Muscle weakness (generalized)  Tight fascia      Subjective Assessment - 12/10/14 0825    Subjective  S: I have no pain. I think therapy is helping.   Currently in Pain? No/denies            [facility] OT Assessment - 12/10/14 0826    Assessment   Diagnosis R neck & shoulder strain   Precautions   Precautions None                  OT Treatments/Exercises (OP) - 12/10/14 0826    Exercises   Exercises Shoulder   Shoulder Exercises: Supine   Protraction PROM;5 reps   Horizontal ABduction PROM;5 reps   External Rotation PROM;5 reps   Internal Rotation PROM;5 reps   Flexion PROM;5 reps   ABduction PROM;5 reps   Shoulder Exercises: Prone   Other Prone Exercises Face Down Field Goal; 15X; 2#   Other Prone Exercises Hughston exercises; 5 positions; 2#; 15X   Shoulder Exercises: Standing   External Rotation Theraband;12 reps   Theraband Level (Shoulder External Rotation) Level 1 (Yellow)   External Rotation Limitations Behind back   Other Standing Exercises Standing Bicep Curl with shoulder abducted 90 degrees  and performed with hand pronated and supinated; 12X; green band.   Other Standing Exercises Standing Field Goal; 2#; 15X   Shoulder Exercises: ROM/Strengthening   Pushups --  Straight arm push ups; 15X   Other ROM/Strengthening Exercises Prone; Scapular Raises; 15X; 2#   Shoulder Exercises: Stretch   Other Shoulder Stretches Shoulder mobility stretches using long PVC pipe.   Manual Therapy   Manual Therapy Myofascial release   Myofascial Release Myofascial release and manual stretching right upper trapezius, medial and anterior deltoid and right side cervical region to decrease fascial restrictions and increase joint mobilty in a pain free zone.                  OT Short Term Goals - 11/25/14 1138    OT SHORT TERM GOAL #1   Title Patient will be educated on HEP.    Time 4   Period Weeks   Status On-going   OT SHORT TERM GOAL #2   Title Pt will decrease pain to 1/10 or less during daily & work tasks.    Time 4   Period Weeks   Status On-going   OT SHORT TERM GOAL #3   Title Patient will decrease fascial restrictions from mod to min amount.  Time 4   Period Weeks   Status On-going   OT SHORT TERM GOAL #4   Title Pt will increase RUE strength to 5/5 to increase ability to complete work tasks.    Status On-going   OT SHORT TERM GOAL #5   Title Pt will return to highest level of functioning and independence during daily and work tasks.    Time 4   Period Weeks   Status On-going                  Plan - 12/10/14 0925    Clinical Impression Statement A: Pt reported increased sensation of pulling with standing field goal. Tricep dip motion still painful which may be related to levator scapulae weakness.    Plan P: Add weighted shoulder shrugs and lateral raises to increase weak levator scapulae.        Problem List Patient Active Problem List   Diagnosis Date Noted  . S/P rotator cuff repair 02/10/2014  . Pain in joint, shoulder region 02/10/2014  .  Muscle weakness (generalized) 02/10/2014  . Decreased range of motion of right shoulder 02/10/2014    Ailene Ravel, OTR/L,CBIS  [phone]  12/10/2014, 9:57 AM  [place], [place], [address] Phone: [phone]   Fax:  [phone]

## 2014-12-15 ENCOUNTER — Ambulatory Visit (HOSPITAL_COMMUNITY): Payer: PRIVATE HEALTH INSURANCE

## 2014-12-15 ENCOUNTER — Encounter (HOSPITAL_COMMUNITY): Payer: Self-pay

## 2014-12-15 DIAGNOSIS — M6281 Muscle weakness (generalized): Secondary | ICD-10-CM

## 2014-12-15 DIAGNOSIS — M6289 Other specified disorders of muscle: Secondary | ICD-10-CM

## 2014-12-15 DIAGNOSIS — M629 Disorder of muscle, unspecified: Secondary | ICD-10-CM

## 2014-12-15 NOTE — Therapy (Signed)
[place], [place], [address] Phone: [phone]   Fax:  [phone]  Occupational Therapy Treatment  Patient Details  Name: Marcus Chen MRN: [id number] Date of Birth: 1966/02/27 Referring Provider:  Vickey Huger, MD  Encounter Date: 12/15/2014      OT End of Session - 12/15/14 0845    Visit Number 7   Number of Visits 10   Date for OT Re-Evaluation 01/23/15  Mini reassess 12/22/2014   OT Start Time 0805   OT Stop Time 0845   OT Time Calculation (min) 40 min   Activity Tolerance Patient tolerated treatment well   Behavior During Therapy [facility] for tasks assessed/performed      Past Medical History  Diagnosis Date  . Kidney stone   . Gout     Past Surgical History  Procedure Laterality Date  . Appendectomy      There were no vitals filed for this visit.  Visit Diagnosis:  Muscle weakness (generalized)  Tight fascia      Subjective Assessment - 12/15/14 0831    Subjective  S: I did a lot of painting in my daughter's bathroom so I'm sore all over.    Currently in Pain? No/denies            [facility] OT Assessment - 12/15/14 0831    Assessment   Diagnosis R neck & shoulder strain   Precautions   Precautions None                  OT Treatments/Exercises (OP) - 12/15/14 0833    Exercises   Exercises Shoulder   Shoulder Exercises: Supine   Protraction PROM;5 reps   Horizontal ABduction PROM;5 reps   External Rotation PROM;5 reps   Internal Rotation PROM;5 reps   Flexion PROM;5 reps   ABduction PROM;5 reps   Shoulder Exercises: Standing   External Rotation Theraband;12 reps   Theraband Level (Shoulder External Rotation) Level 2 (Red)   External Rotation Limitations Behind back   Other Standing Exercises Weighted shoulder shrugs; 10#; 20X   Shoulder Exercises: ROM/Strengthening   Pushups --  straight arm 15X   Other ROM/Strengthening Exercises Standing Field Goal; 3#; 15X   Other ROM/Strengthening Exercises Standing; lateral and medial raises; 3#; 15X                  OT Short Term Goals - 11/25/14 1138    OT SHORT TERM GOAL #1   Title Patient will be educated on HEP.    Time 4   Period Weeks   Status On-going   OT SHORT TERM GOAL #2   Title Pt will decrease pain to 1/10 or less during daily & work tasks.    Time 4   Period Weeks   Status On-going   OT SHORT TERM GOAL #3   Title Patient will decrease fascial restrictions from mod to min amount.    Time 4   Period Weeks   Status On-going   OT SHORT TERM GOAL #4   Title Pt will increase RUE strength to 5/5 to increase ability to complete work tasks.    Status On-going   OT SHORT TERM GOAL #5   Title Pt will return to highest level of functioning and independence during daily and work tasks.    Time 4   Period Weeks   Status On-going  Plan - 12/15/14 0845    Clinical Impression Statement A: Added weighted shoulder shrugs, lateral and medial raisess, increased strengthening weights to 3#, and increase ER with band to red. Patient tolerated well.    Plan P Mini band scapular retraction against the wall        Problem List Patient Active Problem List   Diagnosis Date Noted  . S/P rotator cuff repair 02/10/2014  . Pain in joint, shoulder region 02/10/2014  . Muscle weakness (generalized) 02/10/2014  . Decreased range of motion of right shoulder 02/10/2014    Ailene Ravel, OTR/L,CBIS  [phone]  12/15/2014, 9:21 AM  [place], [place], [address] Phone: [phone]   Fax:  [phone]

## 2014-12-18 ENCOUNTER — Encounter (HOSPITAL_COMMUNITY): Payer: Self-pay

## 2014-12-18 ENCOUNTER — Ambulatory Visit (HOSPITAL_COMMUNITY): Payer: PRIVATE HEALTH INSURANCE

## 2014-12-18 DIAGNOSIS — M25511 Pain in right shoulder: Secondary | ICD-10-CM

## 2014-12-18 DIAGNOSIS — M6281 Muscle weakness (generalized): Secondary | ICD-10-CM | POA: Diagnosis not present

## 2014-12-18 DIAGNOSIS — M6289 Other specified disorders of muscle: Secondary | ICD-10-CM

## 2014-12-18 DIAGNOSIS — M629 Disorder of muscle, unspecified: Secondary | ICD-10-CM

## 2014-12-18 NOTE — Therapy (Signed)
[place], [place], [address] Phone: [phone]   Fax:  [phone]  Occupational Therapy Treatment  Patient Details  Name: Marcus Chen MRN: [id number] Date of Birth: 10/07/1965 Referring Provider:  Vickey Huger, MD  Encounter Date: 12/18/2014      OT End of Session - 12/18/14 0833    Visit Number 8   Number of Visits 10   Date for OT Re-Evaluation 01/23/15  Mini reassess 12/22/2014   OT Start Time 0808   OT Stop Time 0849   OT Time Calculation (min) 41 min   Activity Tolerance Patient tolerated treatment well   Behavior During Therapy [facility] for tasks assessed/performed      Past Medical History  Diagnosis Date  . Kidney stone   . Gout     Past Surgical History  Procedure Laterality Date  . Appendectomy      There were no vitals filed for this visit.  Visit Diagnosis:  Muscle weakness (generalized)  Tight fascia  Pain in joint, shoulder region, right      Subjective Assessment - 12/18/14 0830    Subjective  S: Wednesday it was hurting me a lot.    Currently in Pain? Yes   Pain Score 2    Pain Location Shoulder   Pain Orientation Right   Pain Descriptors / Indicators Nagging   Pain Type Acute pain            [facility] OT Assessment - 12/18/14 0831    Assessment   Diagnosis R neck & shoulder strain   Precautions   Precautions None                  OT Treatments/Exercises (OP) - 12/18/14 0831    Exercises   Exercises Shoulder   Shoulder Exercises: Supine   Protraction PROM;5 reps   Horizontal ABduction PROM;5 reps   External Rotation PROM;5 reps   Internal Rotation PROM;5 reps   Flexion PROM;5 reps   ABduction PROM;5 reps   Shoulder Exercises: Prone   Other Prone Exercises Face Down Field Goal; 15X; 2#   Other Prone Exercises Hughston exercises; 5 positions; 2#; 15X   Shoulder Exercises: Standing   External Rotation Theraband;12 reps   Theraband Level (Shoulder  External Rotation) Level 2 (Red)   External Rotation Limitations Behind back   Other Standing Exercises Standing Bicep Curl with shoulder abducted 90 degrees and performed with hand pronated and supinated; 12X; double green band.   Other Standing Exercises Weighted shoulder shrugs; 10#; 20X   Shoulder Exercises: ROM/Strengthening   UBE (Upper Arm Bike) Level 3 reverse 6'   Other ROM/Strengthening Exercises Standing Field Goal; 3#; 15X   Other ROM/Strengthening Exercises Standing; lateral and medial raises; 3#; 15X   Manual Therapy   Manual Therapy Myofascial release   Myofascial Release Myofascial release and manual stretching right upper trapezius, medial and anterior deltoid and right side cervical region to decrease fascial restrictions and increase joint mobilty in a pain free zone.                  OT Short Term Goals - 11/25/14 1138    OT SHORT TERM GOAL #1   Title Patient will be educated on HEP.    Time 4   Period Weeks   Status On-going   OT SHORT TERM GOAL #2   Title Pt will decrease pain to 1/10 or less during daily & work tasks.    Time  4   Period Weeks   Status On-going   OT SHORT TERM GOAL #3   Title Patient will decrease fascial restrictions from mod to min amount.    Time 4   Period Weeks   Status On-going   OT SHORT TERM GOAL #4   Title Pt will increase RUE strength to 5/5 to increase ability to complete work tasks.    Status On-going   OT SHORT TERM GOAL #5   Title Pt will return to highest level of functioning and independence during daily and work tasks.    Time 4   Period Weeks   Status On-going                  Plan - 12/18/14 1583    Clinical Impression Statement A: Added mini band scapular retraction against the wall. patient tolerataed well. Pt continues to complain of pain/"trigger point" in medial deltoid region.   Plan P: Mini reassessment. Increase strengthening to 4#.        Problem List Patient Active Problem List    Diagnosis Date Noted  . S/P rotator cuff repair 02/10/2014  . Pain in joint, shoulder region 02/10/2014  . Muscle weakness (generalized) 02/10/2014  . Decreased range of motion of right shoulder 02/10/2014    Ailene Ravel, OTR/L,CBIS  [phone]  12/18/2014, 9:06 AM  [place], [place], [address] Phone: [phone]   Fax:  [phone]

## 2014-12-22 ENCOUNTER — Ambulatory Visit (HOSPITAL_COMMUNITY): Payer: PRIVATE HEALTH INSURANCE

## 2014-12-24 ENCOUNTER — Encounter (HOSPITAL_COMMUNITY): Payer: Self-pay

## 2014-12-24 ENCOUNTER — Ambulatory Visit (HOSPITAL_COMMUNITY): Payer: PRIVATE HEALTH INSURANCE

## 2014-12-24 DIAGNOSIS — M6281 Muscle weakness (generalized): Secondary | ICD-10-CM | POA: Diagnosis not present

## 2014-12-24 DIAGNOSIS — M629 Disorder of muscle, unspecified: Secondary | ICD-10-CM

## 2014-12-24 DIAGNOSIS — M6289 Other specified disorders of muscle: Secondary | ICD-10-CM

## 2014-12-24 NOTE — Therapy (Signed)
[place], [place], [address] Phone: [phone]   Fax:  [phone]  Occupational Therapy Treatment and Reassessment  Patient Details  Name: Marcus Chen MRN: [id number] Date of Birth: [birth year] Referring Provider:  Vickey Huger, MD  Encounter Date: 12/24/2014      OT End of Session - 12/24/14 1333    Visit Number 9   Number of Visits 16   Date for OT Re-Evaluation 01/23/15   OT Start Time 0805   OT Stop Time 0845   OT Time Calculation (min) 40 min   Activity Tolerance Patient tolerated treatment well   Behavior During Therapy [facility] for tasks assessed/performed      Past Medical History  Diagnosis Date  . Kidney stone   . Gout     Past Surgical History  Procedure Laterality Date  . Appendectomy      There were no vitals filed for this visit.  Visit Diagnosis:  Tight fascia      Subjective Assessment - 12/24/14 0849    Subjective  S: I defintely feel like it's getting better but I would like to try and see if it could be a little bit better.   Currently in Pain? Yes   Pain Score 2    Pain Location Shoulder   Pain Orientation Right            [facility] OT Assessment - 12/24/14 0807    Assessment   Diagnosis R neck & shoulder strain   Precautions   Precautions None   AROM   Overall AROM Comments Assessed in standing, ER/IR adducted   AROM Assessment Site Shoulder;Cervical   Right/Left Shoulder Right   Right Shoulder Flexion 172 Degrees  on eval: 170   Right Shoulder ABduction 155 Degrees  on eval: 173   Right Shoulder Internal Rotation 90 Degrees  same at eval   Right Shoulder External Rotation 90 Degrees  same at eval   Cervical - Right Side Bend 50 degrees  WNL   Cervical - Left Side Bend 50 degrees  WNL   Cervical - Right Rotation 70 degrees  WNL   Cervical - Left Rotation 70 degrees  WNL   Strength   Overall Strength Comments Assessed seated, ER/IR adducted    Strength  Assessment Site Shoulder   Right/Left Shoulder Right   Right Shoulder Flexion 5/5  on eval: 4+/5   Right Shoulder ABduction 4/5  on eval: 4-/5   Right Shoulder Internal Rotation 4/5  on eval: 4/5   Right Shoulder External Rotation 4+/5  on eval: 4/5                  OT Treatments/Exercises (OP) - 12/24/14 0851    Exercises   Exercises Shoulder   Shoulder Exercises: Supine   Protraction PROM;5 reps   Horizontal ABduction PROM;5 reps   External Rotation PROM;5 reps   Internal Rotation PROM;5 reps   Flexion PROM;5 reps   ABduction PROM;5 reps   Manual Therapy   Manual Therapy Other (comment)   Myofascial Release Myofascial release and manual stretching right upper trapezius, medial and anterior deltoid and right side cervical region to decrease fascial restrictions and increase joint mobilty in a pain free zone.   Other Manual Therapy First rib manipulation completed to decpress elevated first rib and decrease pain level and increase range of motion.  OT Short Term Goals - 12/24/14 1610    OT SHORT TERM GOAL #1   Title Patient will be educated on HEP.    Time 4   Period Weeks   Status On-going   OT SHORT TERM GOAL #2   Title Pt will decrease pain to 1/10 or less during daily & work tasks.    Time 4   Period Weeks   Status On-going   OT SHORT TERM GOAL #3   Title Patient will decrease fascial restrictions from mod to min amount.    Time 4   Period Weeks   Status On-going   OT SHORT TERM GOAL #4   Title Pt will increase RUE strength to 5/5 to increase ability to complete work tasks.    Status On-going   OT SHORT TERM GOAL #5   Title Pt will return to highest level of functioning and independence during daily and work tasks.    Time 4   Period Weeks   Status On-going                  Plan - 12/24/14 1334    Clinical Impression Statement A: Completed first rib manipulation for a possible elevated first rib. Discussed  first rib syndrome and the possibility of this being the cause of his increased neck and shoulder pain. Pt was shown how to complete manipuilation at home if he needed. Recommended continuing therapy for 3 more weeks to continue progressing towards therapy goals.    Plan P: Cont with strengthening increasing to 4#. Follow up on previous manipulation and assess pain and tightness.         Problem List Patient Active Problem List   Diagnosis Date Noted  . S/P rotator cuff repair 02/10/2014  . Pain in joint, shoulder region 02/10/2014  . Muscle weakness (generalized) 02/10/2014  . Decreased range of motion of right shoulder 02/10/2014    Ailene Ravel, OTR/L,CBIS  [phone]  12/24/2014, 2:13 PM  [place], [place], [address] Phone: [phone]   Fax:  [phone]

## 2014-12-30 ENCOUNTER — Ambulatory Visit (HOSPITAL_COMMUNITY): Payer: PRIVATE HEALTH INSURANCE | Attending: Orthopedic Surgery

## 2014-12-30 ENCOUNTER — Encounter (HOSPITAL_COMMUNITY): Payer: Self-pay

## 2014-12-30 DIAGNOSIS — S161XXA Strain of muscle, fascia and tendon at neck level, initial encounter: Secondary | ICD-10-CM | POA: Insufficient documentation

## 2014-12-30 DIAGNOSIS — M6289 Other specified disorders of muscle: Secondary | ICD-10-CM

## 2014-12-30 DIAGNOSIS — S46911A Strain of unspecified muscle, fascia and tendon at shoulder and upper arm level, right arm, initial encounter: Secondary | ICD-10-CM | POA: Insufficient documentation

## 2014-12-30 DIAGNOSIS — M6281 Muscle weakness (generalized): Secondary | ICD-10-CM | POA: Insufficient documentation

## 2014-12-30 DIAGNOSIS — M629 Disorder of muscle, unspecified: Secondary | ICD-10-CM | POA: Diagnosis not present

## 2014-12-30 DIAGNOSIS — M25511 Pain in right shoulder: Secondary | ICD-10-CM

## 2014-12-30 DIAGNOSIS — X58XXXA Exposure to other specified factors, initial encounter: Secondary | ICD-10-CM | POA: Insufficient documentation

## 2014-12-30 NOTE — Therapy (Signed)
[place], [place], [address] Phone: [phone]   Fax:  [phone]  Occupational Therapy Treatment  Patient Details  Name: Marcus Chen MRN: [id number] Date of Birth: 1966-01-07 Referring Provider:  Vickey Huger, MD  Encounter Date: 12/30/2014      OT End of Session - 12/30/14 1017    Visit Number 10   Number of Visits 16   Date for OT Re-Evaluation 01/23/15   OT Start Time 0935   OT Stop Time 1015   OT Time Calculation (min) 40 min   Activity Tolerance Patient tolerated treatment well   Behavior During Therapy [facility] for tasks assessed/performed      Past Medical History  Diagnosis Date  . Kidney stone   . Gout     Past Surgical History  Procedure Laterality Date  . Appendectomy      There were no vitals filed for this visit.  Visit Diagnosis:  Tight fascia  Muscle weakness (generalized)  Pain in joint, shoulder region, right      Subjective Assessment - 12/30/14 0956    Subjective  S: I've been doing the first rib manipulation to myself or having someone else do it and it's really helping. I seems like it keeps elevating though shortly after.    Currently in Pain? No/denies            [facility] OT Assessment - 12/30/14 0957    Assessment   Diagnosis R neck & shoulder strain   Precautions   Precautions None                  OT Treatments/Exercises (OP) - 12/30/14 0957    Exercises   Exercises Shoulder   Shoulder Exercises: Supine   Protraction PROM;5 reps   Horizontal ABduction PROM;5 reps   External Rotation PROM;5 reps   Internal Rotation PROM;5 reps   Flexion PROM;5 reps   ABduction PROM;5 reps   Shoulder Exercises: Prone   Other Prone Exercises Face Down Field Goal; 15X; 4#   Other Prone Exercises Hughston exercises; 5 positions; 4#; 15X   Shoulder Exercises: Standing   External Rotation Theraband;15 reps   Theraband Level (Shoulder External Rotation) Level 3  (Green)   External Rotation Limitations Behind back   Retraction Theraband;15 reps   Theraband Level (Shoulder Retraction) Level 3 (Green)   Other Standing Exercises Weighted shoulder shrugs; 10#; 20X   Shoulder Exercises: ROM/Strengthening   Pushups --  straight arm push ups 15X   Other ROM/Strengthening Exercises Serratus anterior; red band; 15X; on wall   Manual Therapy   Manual Therapy Other (comment);Myofascial release   Myofascial Release Myofascial release and manual stretching right upper trapezius, medial and anterior deltoid and right side cervical region to decrease fascial restrictions and increase joint mobilty in a pain free zone.   Other Manual Therapy First rib manipulation completed to decpress elevated first rib and decrease pain level and increase range of motion.                   OT Short Term Goals - 12/24/14 0300    OT SHORT TERM GOAL #1   Title Patient will be educated on HEP.    Time 4   Period Weeks   Status On-going   OT SHORT TERM GOAL #2   Title Pt will decrease pain to 1/10 or less during daily & work tasks.    Time 4   Period Weeks  Status On-going   OT SHORT TERM GOAL #3   Title Patient will decrease fascial restrictions from mod to min amount.    Time 4   Period Weeks   Status On-going   OT SHORT TERM GOAL #4   Title Pt will increase RUE strength to 5/5 to increase ability to complete work tasks.    Status On-going   OT SHORT TERM GOAL #5   Title Pt will return to highest level of functioning and independence during daily and work tasks.    Time 4   Period Weeks   Status On-going                  Plan - 12/30/14 1017    Clinical Impression Statement A: Increased weight to 4#, added serratus anterior strengthening exercises. Patient tolerated well.    Plan P: Cont with first rib maniulation and serratus anterior strengthening.         Problem List Patient Active Problem List   Diagnosis Date Noted  . S/P  rotator cuff repair 02/10/2014  . Pain in joint, shoulder region 02/10/2014  . Muscle weakness (generalized) 02/10/2014  . Decreased range of motion of right shoulder 02/10/2014    Ailene Ravel, OTR/L,CBIS  [phone]  12/30/2014, 11:49 AM  [place], [place], [address] Phone: [phone]   Fax:  [phone]

## 2014-12-31 ENCOUNTER — Encounter (HOSPITAL_COMMUNITY): Payer: PRIVATE HEALTH INSURANCE | Admitting: Occupational Therapy

## 2015-01-05 ENCOUNTER — Ambulatory Visit (HOSPITAL_COMMUNITY): Payer: PRIVATE HEALTH INSURANCE | Admitting: Occupational Therapy

## 2015-01-05 ENCOUNTER — Encounter (HOSPITAL_COMMUNITY): Payer: Self-pay | Admitting: Occupational Therapy

## 2015-01-05 DIAGNOSIS — M629 Disorder of muscle, unspecified: Secondary | ICD-10-CM

## 2015-01-05 DIAGNOSIS — M6281 Muscle weakness (generalized): Secondary | ICD-10-CM

## 2015-01-05 DIAGNOSIS — M25511 Pain in right shoulder: Secondary | ICD-10-CM

## 2015-01-05 DIAGNOSIS — S46911A Strain of unspecified muscle, fascia and tendon at shoulder and upper arm level, right arm, initial encounter: Secondary | ICD-10-CM | POA: Diagnosis not present

## 2015-01-05 DIAGNOSIS — M6289 Other specified disorders of muscle: Secondary | ICD-10-CM

## 2015-01-05 DIAGNOSIS — M25611 Stiffness of right shoulder, not elsewhere classified: Secondary | ICD-10-CM

## 2015-01-05 NOTE — Therapy (Signed)
[place], [place], [address] Phone: [phone]   Fax:  [phone]  Occupational Therapy Treatment  Patient Details  Name: KAYN HAYMORE MRN: [id number] Date of Birth: September 25, 1965 Referring Provider:  Vickey Huger, MD  Encounter Date: 01/05/2015      OT End of Session - 01/05/15 1202    Visit Number 11   Number of Visits 16   Date for OT Re-Evaluation 01/23/15   OT Start Time 1102   OT Stop Time 1144   OT Time Calculation (min) 42 min   Activity Tolerance Patient tolerated treatment well   Behavior During Therapy [facility] for tasks assessed/performed      Past Medical History  Diagnosis Date  . Kidney stone   . Gout     Past Surgical History  Procedure Laterality Date  . Appendectomy      There were no vitals filed for this visit.  Visit Diagnosis:  Tight fascia  Muscle weakness (generalized)  Pain in joint, shoulder region, right  Right shoulder strain, initial encounter  Decreased range of motion of right shoulder      Subjective Assessment - 01/05/15 1103    Subjective  S: It's pretty good, it's tight though.    Currently in Pain? No/denies                      OT Treatments/Exercises (OP) - 01/05/15 1104    Exercises   Exercises Shoulder   Shoulder Exercises: Supine   Protraction PROM;5 reps   Horizontal ABduction PROM;5 reps   External Rotation PROM;5 reps   Internal Rotation PROM;5 reps   Flexion PROM;5 reps   ABduction PROM;5 reps   Other Supine Exercises Supine dumbell pullovers 10X 4# weight   Other Supine Exercises Serratus anterior punches with orange weighted ball.    Shoulder Exercises: Standing   Other Standing Exercises Gunslinger exercises, 10X, 4# weights   Other Standing Exercises Weighted shoulder shrugs; 10#; 20X   Shoulder Exercises: ROM/Strengthening   Pushups Other (comment)  Straight arm push-ups 15X   Other ROM/Strengthening Exercises Serratus  anterior; red band; 15X; on wall   Other ROM/Strengthening Exercises Face pulls using cable column, 10X, 8#   Manual Therapy   Manual Therapy Other (comment);Myofascial release  first rib manipulation   Myofascial Release Myofascial release and manual stretching right upper trapezius, medial and anterior deltoid and right side cervical region to decrease fascial restrictions and increase joint mobilty in a pain free zone.   Other Manual Therapy First rib manipulation completed to decpress elevated first rib and decrease pain level and increase range of motion.   Pt flexed arm to 90 degrees during manipulation                  OT Short Term Goals - 12/24/14 3500    OT SHORT TERM GOAL #1   Title Patient will be educated on HEP.    Time 4   Period Weeks   Status On-going   OT SHORT TERM GOAL #2   Title Pt will decrease pain to 1/10 or less during daily & work tasks.    Time 4   Period Weeks   Status On-going   OT SHORT TERM GOAL #3   Title Patient will decrease fascial restrictions from mod to min amount.    Time 4   Period Weeks   Status On-going   OT SHORT TERM GOAL #4   Title  Pt will increase RUE strength to 5/5 to increase ability to complete work tasks.    Status On-going   OT SHORT TERM GOAL #5   Title Pt will return to highest level of functioning and independence during daily and work tasks.    Time 4   Period Weeks   Status On-going                  Plan - 01/05/15 1203    Clinical Impression Statement A: Added exercises targeting the serratus anterior this session. Pt tolerated well, reporting that he felt the face pull exercise at the cable column really hit the tight areas. Added flexion to 90 degrees during first rib manipulation.    Plan P: Follow up on adding flexion with first rib manipulation. Cont serratus ant strengthening.         Problem List Patient Active Problem List   Diagnosis Date Noted  . S/P rotator cuff repair 02/10/2014   . Pain in joint, shoulder region 02/10/2014  . Muscle weakness (generalized) 02/10/2014  . Decreased range of motion of right shoulder 02/10/2014    [name], OTR/L  [phone]  01/05/2015, 12:08 PM  [place], [place], [address] Phone: [phone]   Fax:  [phone]

## 2015-01-12 ENCOUNTER — Ambulatory Visit (HOSPITAL_COMMUNITY): Payer: PRIVATE HEALTH INSURANCE | Admitting: Occupational Therapy

## 2015-01-12 ENCOUNTER — Encounter (HOSPITAL_COMMUNITY): Payer: Self-pay | Admitting: Occupational Therapy

## 2015-01-12 DIAGNOSIS — M629 Disorder of muscle, unspecified: Secondary | ICD-10-CM

## 2015-01-12 DIAGNOSIS — S46911A Strain of unspecified muscle, fascia and tendon at shoulder and upper arm level, right arm, initial encounter: Secondary | ICD-10-CM | POA: Diagnosis not present

## 2015-01-12 DIAGNOSIS — M6281 Muscle weakness (generalized): Secondary | ICD-10-CM

## 2015-01-12 DIAGNOSIS — M25511 Pain in right shoulder: Secondary | ICD-10-CM

## 2015-01-12 DIAGNOSIS — M25611 Stiffness of right shoulder, not elsewhere classified: Secondary | ICD-10-CM

## 2015-01-12 DIAGNOSIS — M6289 Other specified disorders of muscle: Secondary | ICD-10-CM

## 2015-01-12 NOTE — Therapy (Signed)
[place], [place], [address] Phone: [phone]   Fax:  [phone]  Occupational Therapy Treatment  Patient Details  Name: Marcus Chen MRN: [id number] Date of Birth: 06-07-1965 Referring Provider:  Vickey Huger, MD  Encounter Date: 01/12/2015      OT End of Session - 01/12/15 1148    Visit Number 12   Number of Visits 16   Date for OT Re-Evaluation 01/23/15   OT Start Time 1100   OT Stop Time 1147   OT Time Calculation (min) 47 min   Activity Tolerance Patient tolerated treatment well   Behavior During Therapy [facility] for tasks assessed/performed      Past Medical History  Diagnosis Date  . Kidney stone   . Gout     Past Surgical History  Procedure Laterality Date  . Appendectomy      There were no vitals filed for this visit.  Visit Diagnosis:  Tight fascia  Muscle weakness (generalized)  Pain in joint, shoulder region, right  Decreased range of motion of right shoulder      Subjective Assessment - 01/12/15 1100    Subjective  S: I stepped it up this week and I could feel it a couple of days.    Currently in Pain? No/denies            [facility] OT Assessment - 01/12/15 1057    Assessment   Diagnosis R neck & shoulder strain   Precautions   Precautions None                  OT Treatments/Exercises (OP) - 01/12/15 1103    Exercises   Exercises Shoulder   Shoulder Exercises: Supine   Protraction PROM;5 reps   Horizontal ABduction PROM;5 reps   External Rotation PROM;5 reps   Internal Rotation PROM;5 reps   Flexion PROM;5 reps;Strengthening;15 reps   Shoulder Flexion Weight (lbs) 10   ABduction PROM;5 reps   Other Supine Exercises Supine dumbell pullovers 15X 10# weight   Other Supine Exercises Serratus anterior punches with orange weighted ball. 15X   Shoulder Exercises: Standing   External Rotation Theraband;20 reps   Theraband Level (Shoulder External Rotation)  Level 3 (Green)   External Rotation Limitations Behind back   Other Standing Exercises Weighted shoulder shrugs; 10#; 20X   Shoulder Exercises: ROM/Strengthening   UBE (Upper Arm Bike) Level 3 reverse 6'   Pushups Other (comment)  straight arm push-ups 20X   Ball on Wall 2' in abduction with orange ball   Other ROM/Strengthening Exercises Serratus anterior; red band; 20X; on wall   Other ROM/Strengthening Exercises Face pulls using cable column, 20X, 9#   Manual Therapy   Manual Therapy Other (comment);Myofascial release  first rib manipulation   Myofascial Release Myofascial release and manual stretching right upper trapezius, medial and anterior deltoid and right side cervical region to decrease fascial restrictions and increase joint mobilty in a pain free zone.   Other Manual Therapy First rib manipulation completed to decpress elevated first rib and decrease pain level and increase range of motion.   Pt flexed arm to 90 degrees during manipulation                  OT Short Term Goals - 12/24/14 1324    OT SHORT TERM GOAL #1   Title Patient will be educated on HEP.    Time 4   Period Weeks   Status On-going  OT SHORT TERM GOAL #2   Title Pt will decrease pain to 1/10 or less during daily & work tasks.    Time 4   Period Weeks   Status On-going   OT SHORT TERM GOAL #3   Title Patient will decrease fascial restrictions from mod to min amount.    Time 4   Period Weeks   Status On-going   OT SHORT TERM GOAL #4   Title Pt will increase RUE strength to 5/5 to increase ability to complete work tasks.    Status On-going   OT SHORT TERM GOAL #5   Title Pt will return to highest level of functioning and independence during daily and work tasks.    Time 4   Period Weeks   Status On-going                  Plan - 01/12/15 1148    Clinical Impression Statement A: Continued exercises for serratus anterior strengthening this session, resumed theraband  exercises. Pt reports increasing his repetitions and frequency at home, increased soreness at times. Pt feels that he is at a point he can maintain exercises safely outside of therapy.    Plan P: Reassess & discharge. Update HEP for current exercises if necessary         Problem List Patient Active Problem List   Diagnosis Date Noted  . S/P rotator cuff repair 02/10/2014  . Pain in joint, shoulder region 02/10/2014  . Muscle weakness (generalized) 02/10/2014  . Decreased range of motion of right shoulder 02/10/2014    [name], OTR/L  [phone]  01/12/2015, 11:50 AM  [place], [place], [address] Phone: [phone]   Fax:  [phone]

## 2015-01-14 ENCOUNTER — Ambulatory Visit (HOSPITAL_COMMUNITY): Payer: PRIVATE HEALTH INSURANCE | Admitting: Specialist

## 2015-04-02 ENCOUNTER — Encounter (HOSPITAL_COMMUNITY): Payer: PRIVATE HEALTH INSURANCE

## 2015-06-01 MED FILL — Medication: Qty: 1 | Status: AC

## 2015-06-01 MED FILL — Medication: Qty: 1 | Status: CN

## 2015-08-27 ENCOUNTER — Encounter (HOSPITAL_COMMUNITY): Payer: Self-pay

## 2015-08-27 NOTE — Therapy (Unsigned)
[place], [place], [address] Phone: [phone]   Fax:  [phone]  Patient Details  Name: Marcus Chen MRN: [id number] Date of Birth: 1965-08-06 Referring Provider:  No ref. provider found  Encounter Date: 08/27/2015 OCCUPATIONAL THERAPY DISCHARGE SUMMARY  Visits from Start of Care: 12  Current functional level related to goals / functional outcomes: OT SHORT TERM GOAL #1    Title Patient will be educated on HEP.    Time 4   Period Weeks   Status On-going   OT SHORT TERM GOAL #2   Title Pt will decrease pain to 1/10 or less during daily & work tasks.    Time 4   Period Weeks   Status On-going   OT SHORT TERM GOAL #3   Title Patient will decrease fascial restrictions from mod to min amount.    Time 4   Period Weeks   Status On-going   OT SHORT TERM GOAL #4   Title Pt will increase RUE strength to 5/5 to increase ability to complete work tasks.    Status On-going   OT SHORT TERM GOAL #5   Title Pt will return to highest level of functioning and independence during daily and work tasks.    Time 4   Period Weeks   Status On-going          Patient did not present for final therapy session which was a planned reassessment and discharge. Unknown if patient met all goals.   Plan: Patient agrees to discharge.  Patient goals were met. Patient is being discharged due to not returning since the last visit.  ?????       Ailene Ravel, OTR/L,CBIS  [phone]  08/27/2015, 2:54 PM  [place] [address] [place], [place], [address] Phone: [phone]   Fax:  [phone]

## 2016-04-19 DIAGNOSIS — Z0289 Encounter for other administrative examinations: Secondary | ICD-10-CM

## 2016-06-02 ENCOUNTER — Ambulatory Visit (INDEPENDENT_AMBULATORY_CARE_PROVIDER_SITE_OTHER): Payer: PRIVATE HEALTH INSURANCE | Admitting: Urology

## 2016-06-02 DIAGNOSIS — N200 Calculus of kidney: Secondary | ICD-10-CM

## 2016-06-02 DIAGNOSIS — R3121 Asymptomatic microscopic hematuria: Secondary | ICD-10-CM | POA: Diagnosis not present

## 2016-06-12 ENCOUNTER — Other Ambulatory Visit: Payer: Self-pay | Admitting: Urology

## 2016-06-12 DIAGNOSIS — R3121 Asymptomatic microscopic hematuria: Secondary | ICD-10-CM

## 2016-07-10 ENCOUNTER — Ambulatory Visit (HOSPITAL_COMMUNITY)
Admission: RE | Admit: 2016-07-10 | Discharge: 2016-07-10 | Disposition: A | Discharge status: Home / Self Care | Payer: PRIVATE HEALTH INSURANCE | Source: Ambulatory Visit | Attending: Family Medicine | Admitting: Family Medicine

## 2016-07-10 ENCOUNTER — Other Ambulatory Visit (HOSPITAL_COMMUNITY): Payer: Self-pay | Admitting: Family Medicine

## 2016-07-10 DIAGNOSIS — R059 Cough, unspecified: Secondary | ICD-10-CM

## 2016-07-10 DIAGNOSIS — R05 Cough: Secondary | ICD-10-CM

## 2016-07-10 DIAGNOSIS — J45909 Unspecified asthma, uncomplicated: Secondary | ICD-10-CM | POA: Diagnosis not present

## 2016-07-14 ENCOUNTER — Ambulatory Visit: Payer: PRIVATE HEALTH INSURANCE | Admitting: Urology

## 2016-07-14 ENCOUNTER — Ambulatory Visit (HOSPITAL_COMMUNITY)
Admission: RE | Admit: 2016-07-14 | Discharge: 2016-07-14 | Disposition: A | Discharge status: Home / Self Care | Payer: PRIVATE HEALTH INSURANCE | Source: Ambulatory Visit | Attending: Urology | Admitting: Urology

## 2016-07-14 DIAGNOSIS — N289 Disorder of kidney and ureter, unspecified: Secondary | ICD-10-CM | POA: Insufficient documentation

## 2016-07-14 DIAGNOSIS — R3121 Asymptomatic microscopic hematuria: Secondary | ICD-10-CM | POA: Insufficient documentation

## 2016-07-14 DIAGNOSIS — N200 Calculus of kidney: Secondary | ICD-10-CM | POA: Insufficient documentation

## 2016-07-14 MED ORDER — IOPAMIDOL (ISOVUE-300) INJECTION 61%
150.0000 mL | Freq: Once | INTRAVENOUS | Status: AC | PRN
Start: 2016-07-14 — End: 2016-07-14
  Administered 2016-07-14: 120 mL via INTRAVENOUS

## 2016-09-15 ENCOUNTER — Ambulatory Visit (INDEPENDENT_AMBULATORY_CARE_PROVIDER_SITE_OTHER): Payer: PRIVATE HEALTH INSURANCE | Admitting: Urology

## 2016-09-15 DIAGNOSIS — R3121 Asymptomatic microscopic hematuria: Secondary | ICD-10-CM | POA: Diagnosis not present

## 2016-09-15 DIAGNOSIS — N200 Calculus of kidney: Secondary | ICD-10-CM | POA: Diagnosis not present

## 2017-01-16 DIAGNOSIS — Z0189 Encounter for other specified special examinations: Secondary | ICD-10-CM

## 2017-03-19 ENCOUNTER — Encounter: Payer: Self-pay | Admitting: Internal Medicine

## 2017-05-01 ENCOUNTER — Ambulatory Visit (AMBULATORY_SURGERY_CENTER): Payer: Self-pay

## 2017-05-01 VITALS — Ht 70.0 in | Wt 297.6 lb

## 2017-05-01 DIAGNOSIS — Z1211 Encounter for screening for malignant neoplasm of colon: Secondary | ICD-10-CM

## 2017-05-01 MED ORDER — NA SULFATE-K SULFATE-MG SULF 17.5-3.13-1.6 GM/177ML PO SOLN: 1.0000 | Freq: Once | ORAL | 0 refills | Status: AC

## 2017-05-01 NOTE — Progress Notes (Signed)
Per pt, no allergies to soy or egg products.Pt not taking any weight loss meds or using  O2 at home.  Pt refused emmi video. 

## 2017-05-04 ENCOUNTER — Other Ambulatory Visit (HOSPITAL_COMMUNITY): Payer: Self-pay | Admitting: Orthopedic Surgery

## 2017-05-04 DIAGNOSIS — M75122 Complete rotator cuff tear or rupture of left shoulder, not specified as traumatic: Secondary | ICD-10-CM

## 2017-05-16 ENCOUNTER — Other Ambulatory Visit: Payer: Self-pay

## 2017-05-16 ENCOUNTER — Encounter: Payer: Self-pay | Admitting: Internal Medicine

## 2017-05-16 ENCOUNTER — Ambulatory Visit (AMBULATORY_SURGERY_CENTER): Payer: PRIVATE HEALTH INSURANCE | Admitting: Internal Medicine

## 2017-05-16 VITALS — BP 142/69 | HR 72 | Temp 97.5°F | Resp 16 | Ht 70.0 in | Wt 297.0 lb

## 2017-05-16 DIAGNOSIS — D124 Benign neoplasm of descending colon: Secondary | ICD-10-CM

## 2017-05-16 DIAGNOSIS — D125 Benign neoplasm of sigmoid colon: Secondary | ICD-10-CM

## 2017-05-16 DIAGNOSIS — Z1211 Encounter for screening for malignant neoplasm of colon: Secondary | ICD-10-CM | POA: Diagnosis not present

## 2017-05-16 DIAGNOSIS — K6389 Other specified diseases of intestine: Secondary | ICD-10-CM | POA: Diagnosis not present

## 2017-05-16 DIAGNOSIS — K635 Polyp of colon: Secondary | ICD-10-CM | POA: Diagnosis not present

## 2017-05-16 MED ORDER — SODIUM CHLORIDE 0.9 % IV SOLN: 500.0000 mL | INTRAVENOUS | Status: DC

## 2017-05-16 NOTE — Progress Notes (Signed)
Called to room to assist during endoscopic procedure.  Patient ID and intended procedure confirmed with present staff. Received instructions for my participation in the procedure from the performing physician.  

## 2017-05-16 NOTE — Progress Notes (Signed)
  [place] [facility] Post-op Note  Patient: Marcus Chen  Procedure(s) Performed: colonoscopy  Patient Location: [facility] - Recovery Area  Anesthesia Type: Deep Sedation/Propofol  Level of Consciousness: awake, oriented and patient cooperative  Airway and Oxygen Therapy: Patient Spontanous Breathing  Post-op Pain: none  Post-op Assessment:  Post-op Vital signs reviewed, Patient's Cardiovascular Status Stable, Respiratory Function Stable, Patent Airway, No signs of Nausea or vomiting and Pain level controlled  Post-op Vital Signs: Reviewed and stable  Complications: No apparent anesthesia complications  Landyn Lorincz E Caira Poche 12:09 PM

## 2017-05-16 NOTE — Op Note (Signed)
[facility] Patient Name: Marcus Chen Procedure Date: 05/16/2017 11:37 AM MRN: [id number] Endoscopist: Jerene Bears , MD Age: 51 Referring MD:  Date of Birth: Sep 24, 1965 Gender: Male Account #: [id number] Procedure:                Colonoscopy Indications:              Screening for colorectal malignant neoplasm, This                            is the patient's first colonoscopy Medicines:                Monitored Anesthesia Care Procedure:                Pre-Anesthesia Assessment:                           - Prior to the procedure, a History and Physical                            was performed, and patient medications and                            allergies were reviewed. The patient's tolerance of                            previous anesthesia was also reviewed. The risks                            and benefits of the procedure and the sedation                            options and risks were discussed with the patient.                            All questions were answered, and informed consent                            was obtained. Prior Anticoagulants: The patient has                            taken no previous anticoagulant or antiplatelet                            agents. ASA Grade Assessment: II - A patient with                            mild systemic disease. After reviewing the risks                            and benefits, the patient was deemed in                            satisfactory condition to undergo the procedure.  After obtaining informed consent, the colonoscope                            was passed under direct vision. Throughout the                            procedure, the patient's blood pressure, pulse, and                            oxygen saturations were monitored continuously. The                            Model CF-HQ190L [id number]) scope was introduced                            through the anus and advanced  to the the cecum,                            identified by appendiceal orifice and ileocecal                            valve. The colonoscopy was performed without                            difficulty. The patient tolerated the procedure                            well. The quality of the bowel preparation was                            excellent. The ileocecal valve, appendiceal                            orifice, and rectum were photographed. Scope In: 11:49:54 AM Scope Out: 12:01:45 PM Scope Withdrawal Time: 0 hours 9 minutes 53 seconds  Total Procedure Duration: 0 hours 11 minutes 51 seconds  Findings:                 The digital rectal exam was normal.                           Two sessile polyps were found in the descending                            colon. The polyps were 4 to 6 mm in size. These                            polyps were removed with a cold snare. Resection                            and retrieval were complete.                           A 2 mm polyp was found in the sigmoid colon.  The                            polyp was sessile. The polyp was removed with a                            cold snare. Resection and retrieval were complete.                           Multiple medium-mouthed diverticula were found in                            the sigmoid colon.                           The retroflexed view of the distal rectum and anal                            verge was normal and showed no anal or rectal                            abnormalities. Complications:            No immediate complications. Estimated Blood Loss:     Estimated blood loss was minimal. Impression:               - Two 4 to 6 mm polyps in the descending colon,                            removed with a cold snare. Resected and retrieved.                           - One 2 mm polyp in the sigmoid colon, removed with                            a cold snare. Resected and retrieved.                            - Mild diverticulosis in the sigmoid colon.                           - The distal rectum and anal verge are normal on                            retroflexion view. Recommendation:           - Patient has a contact number available for                            emergencies. The signs and symptoms of potential                            delayed complications were discussed with the  patient. Return to normal activities tomorrow.                            Written discharge instructions were provided to the                            patient.                           - Resume previous diet.                           - Continue present medications.                           - Await pathology results.                           - Repeat colonoscopy is recommended. The                            colonoscopy date will be determined after pathology                            results from today's exam become available for                            review. Jerene Bears, MD 05/16/2017 12:04:37 PM This report has been signed electronically.

## 2017-05-16 NOTE — Patient Instructions (Signed)
  Handout given : Polyps and Diverticulosis.   YOU HAD AN ENDOSCOPIC PROCEDURE TODAY AT [facility]:   Refer to the procedure report that was given to you for any specific questions about what was found during the examination.  If the procedure report does not answer your questions, please call your gastroenterologist to clarify.  If you requested that your care partner not be given the details of your procedure findings, then the procedure report has been included in a sealed envelope for you to review at your convenience later.  YOU SHOULD EXPECT: Some feelings of bloating in the abdomen. Passage of more gas than usual.  Walking can help get rid of the air that was put into your GI tract during the procedure and reduce the bloating. If you had a lower endoscopy (such as a colonoscopy or flexible sigmoidoscopy) you may notice spotting of blood in your stool or on the toilet paper. If you underwent a bowel prep for your procedure, you may not have a normal bowel movement for a few days.  Please Note:  You might notice some irritation and congestion in your nose or some drainage.  This is from the oxygen used during your procedure.  There is no need for concern and it should clear up in a day or so.  SYMPTOMS TO REPORT IMMEDIATELY:   Following lower endoscopy (colonoscopy or flexible sigmoidoscopy):  Excessive amounts of blood in the stool  Significant tenderness or worsening of abdominal pains  Swelling of the abdomen that is new, acute  Fever of 100F or higher   For urgent or emergent issues, a gastroenterologist can be reached at any hour by calling [phone].   DIET:  We do recommend a small meal at first, but then you may proceed to your regular diet.  Drink plenty of fluids but you should avoid alcoholic beverages for 24 hours.  ACTIVITY:  You should plan to take it easy for the rest of today and you should NOT DRIVE or use heavy machinery until tomorrow (because  of the sedation medicines used during the test).    FOLLOW UP: Our staff will call the number listed on your records the next business day following your procedure to check on you and address any questions or concerns that you may have regarding the information given to you following your procedure. If we do not reach you, we will leave a message.  However, if you are feeling well and you are not experiencing any problems, there is no need to return our call.  We will assume that you have returned to your regular daily activities without incident.  If any biopsies were taken you will be contacted by phone or by letter within the next 1-3 weeks.  Please call us at [phone] if you have not heard about the biopsies in 3 weeks.    SIGNATURES/CONFIDENTIALITY: You and/or your care partner have signed paperwork which will be entered into your electronic medical record.  These signatures attest to the fact that that the information above on your After Visit Summary has been reviewed and is understood.  Full responsibility of the confidentiality of this discharge information lies with you and/or your care-partner.

## 2017-05-16 NOTE — Progress Notes (Signed)
Pt's states no medical or surgical changes since previsit or office visit.  Patient stating he has severe post op nausea and vomiting that has increased in severity. Patient stating he now has "sea sickness".

## 2017-05-17 ENCOUNTER — Telehealth: Payer: Self-pay

## 2017-05-17 NOTE — Telephone Encounter (Signed)
  Follow up Call-  Call back number 05/16/2017  Post procedure Call Back phone  # [phone]  Permission to leave phone message Yes  Some recent data might be hidden     Patient questions:  Do you have a fever, pain , or abdominal swelling? No. Pain Score  0 *  Have you tolerated food without any problems? Yes.    Have you been able to return to your normal activities? Yes.    Do you have any questions about your discharge instructions: Diet   No. Medications  No. Follow up visit  No.  Do you have questions or concerns about your Care? No.  Actions: * If pain score is 4 or above: No action needed, pain <4.

## 2017-05-17 NOTE — Telephone Encounter (Signed)
  Follow up Call-  Call back number 05/16/2017  Post procedure Call Back phone  # [phone]  Permission to leave phone message Yes  Some recent data might be hidden    Attempted to leave a message but mailbox was full.

## 2017-05-18 ENCOUNTER — Ambulatory Visit (HOSPITAL_COMMUNITY)
Admission: RE | Admit: 2017-05-18 | Discharge: 2017-05-18 | Disposition: A | Discharge status: Home / Self Care | Payer: PRIVATE HEALTH INSURANCE | Source: Ambulatory Visit | Attending: Orthopedic Surgery | Admitting: Orthopedic Surgery

## 2017-05-18 DIAGNOSIS — M75122 Complete rotator cuff tear or rupture of left shoulder, not specified as traumatic: Secondary | ICD-10-CM | POA: Insufficient documentation

## 2017-05-30 ENCOUNTER — Encounter: Payer: Self-pay | Admitting: Internal Medicine

## 2017-06-04 ENCOUNTER — Telehealth (HOSPITAL_COMMUNITY): Payer: Self-pay | Admitting: Internal Medicine

## 2017-06-04 NOTE — Telephone Encounter (Signed)
06/04/17  We received a referral for patient's shoulder but the referral was written for PT.  I faxed back to Dr. Ruel Favors office to request an OT referral.

## 2017-06-25 ENCOUNTER — Ambulatory Visit (HOSPITAL_COMMUNITY): Payer: PRIVATE HEALTH INSURANCE | Attending: Orthopedic Surgery

## 2017-06-25 ENCOUNTER — Encounter (HOSPITAL_COMMUNITY): Payer: Self-pay

## 2017-06-25 ENCOUNTER — Other Ambulatory Visit: Payer: Self-pay

## 2017-06-25 DIAGNOSIS — M25512 Pain in left shoulder: Secondary | ICD-10-CM

## 2017-06-25 DIAGNOSIS — M25612 Stiffness of left shoulder, not elsewhere classified: Secondary | ICD-10-CM | POA: Diagnosis present

## 2017-06-25 DIAGNOSIS — R29898 Other symptoms and signs involving the musculoskeletal system: Secondary | ICD-10-CM

## 2017-06-25 NOTE — Patient Instructions (Signed)
TOWEL SLIDES COMPLETE FOR 1-3 MINUTES, 3-5 TIMES PER DAY  SHOULDER: Flexion On Table   Place hands on table, elbows straight. Move hips away from body. Press hands down into table. Hold ___ seconds. ___ reps per set, ___ sets per day, ___ days per week  Abduction (Passive)   With arm out to side, resting on table, lower head toward arm, keeping trunk away from table. Hold ____ seconds. Repeat ____ times. Do ____ sessions per day.  Copyright  [employer]. All rights reserved.     Internal Rotation (Assistive)   Seated with elbow bent at right angle and held against side, slide arm on table surface in an inward arc. Repeat ____ times. Do ____ sessions per day. Activity: Use this motion to brush crumbs off the table.

## 2017-06-25 NOTE — Therapy (Signed)
[place], [place], [address] Phone: [phone]   Fax:  [phone]  Occupational Therapy Evaluation  Patient Details  Name: Marcus Chen MRN: [id number] Date of Birth: 1965/06/24 Referring Provider: Vickey Huger MD   Encounter Date: 06/25/2017  OT End of Session - 06/25/17 1334    Visit Number  1    Number of Visits  16    Date for OT Re-Evaluation  08/20/17 mini reassess: 07/23/17    Authorization Type  [employer]    OT Start Time  1120    OT Stop Time  1200    OT Time Calculation (min)  40 min    Activity Tolerance  Patient tolerated treatment well    Behavior During Therapy  [facility] for tasks assessed/performed       Past Medical History:  Diagnosis Date  . AC (acromioclavicular) joint bone spurs    spurs  . Allergy   . Asthma   . Gout   . Kidney stone   . Post-operative nausea and vomiting   . Shoulder pain, bilateral     Past Surgical History:  Procedure Laterality Date  . APPENDECTOMY    . ROTATOR CUFF REPAIR  2014   right/ left shoulder was done 2010  . stomach reduction  2003  . TONSILLECTOMY      There were no vitals filed for this visit.  Subjective Assessment - 06/25/17 1129    Subjective   S: I was in the sling for 2 days.    Pertinent History  Patient is a 52 y/o male returning to this clinic after undergoing a left RTC repair, SA, SAD on 06/13/17. Patient was in the sling for 2 days only. Dr. Ronnie Derby has referred patient to occupational therapy for evaluation and treatment.     Special Tests  FOTO score: 48/100    Patient Stated Goals  To be able to use left arm normally.    Currently in Pain?  Yes    Pain Score  1     Pain Location  Shoulder    Pain Orientation  Right    Pain Descriptors / Indicators  Other (Comment) Pulling    Pain Type  Surgical pain    Pain Radiating Towards  N/A    Pain Onset  1 to 4 weeks ago    Pain Frequency  Occasional    Aggravating Factors   lifting and  movement    Pain Relieving Factors  ice, pain med    Effect of Pain on Daily Activities  no effect    Multiple Pain Sites  No        [facility] OT Assessment - 06/25/17 1136      Assessment   Medical Diagnosis  left RTC repair, SAD, SA    Referring Provider  Vickey Huger MD    Onset Date/Surgical Date  06/13/17    Hand Dominance  Right    Next MD Visit  07/17/17    Prior Therapy  Patient has not had therapy on his left shoulder since this surgery. This is his second left shoulder surgery.      Precautions   Precautions  Shoulder    Type of Shoulder Precautions  Standard RTC repair protocol. P/ROM for 4 weeks. Begin AA/ROM on 07/11/17. 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      Mobility   Mobility Status  Independent      Written Expression   Dominant Hand  Right      Vision - History   Baseline Vision  No visual deficits      Cognition   Overall Cognitive Status  Within Functional Limits for tasks assessed      Observation/Other Assessments   Focus on Therapeutic Outcomes (FOTO)   48/100      ROM / Strength   AROM / PROM / Strength  AROM;PROM;Strength      Palpation   Palpation comment  Min fascial restrictions in left anterior deltoid and pectoralis major region.      AROM   Overall AROM Comments  Assessed seated. IR/er adducted    AROM Assessment Site  Shoulder    Right/Left Shoulder  Left    Left Shoulder Flexion  75 Degrees    Left Shoulder ABduction  68  Degrees    Left Shoulder Internal Rotation  90 Degrees    Left Shoulder External Rotation  75 Degrees      PROM   Overall PROM Comments  Assessed supine. IR/er adducted.    PROM Assessment Site  Shoulder    Right/Left Shoulder  Left    Left Shoulder Flexion  147 Degrees    Left Shoulder ABduction  180 Degrees    Left Shoulder Internal Rotation  90 Degrees    Left Shoulder External Rotation  90 Degrees      Strength   Overall Strength  Unable to assess;Due to precautions                      OT Education - 06/25/17 1321    Education provided  Yes    Education Details  Pt is to continue pendulum exercises that were given by Dr. Ronnie Derby. Pt given towel slides.    Person(s) Educated  Patient    Methods  Explanation;Verbal cues;Handout    Comprehension  Verbalized understanding       OT Short Term Goals - 06/25/17 1408      OT SHORT TERM GOAL #1   Title  Patient will be educated and independent with HEP to increase functional performance during daily tasks using LUE.    Time  4    Period  Weeks    Status  New    Target Date  07/23/17      OT SHORT TERM GOAL #2   Title  patient will increase A/ROM to [facility] to increase ability to get dressed with less difficulty.    Time  4    Period  Weeks    Status  New      OT SHORT TERM GOAL #3   Title  Patient will decrease fascial restrictions to trace amount or less to increase functional mobility in LUE needed for daily tasks.     Time  4    Period  Weeks    Status  New      OT SHORT TERM GOAL #4   Title  Patient will increase LUE strength to 3+/5 to increase ability to complete tasks at shoulder height with less difficulty.     Time  4    Period  Weeks    Status  New      OT SHORT TERM GOAL #5   Title  Patient will decrease pain level with shoulder movements and daily tasks to 4/10 or less.     Time  4    Period  Weeks    Status  New        OT Long Term Goals - 06/25/17 1430      OT LONG TERM GOAL #1    Title  Patient will return to highest level of independence with all daily tasks while able to return to work using his LUE as his non-dominant.     Time  8    Period  Weeks    Status  New    Target Date  08/20/17      OT LONG TERM GOAL #2   Title  Patient will report a decreased pain level during daily and work related tasks of 2/10 or less.     Time  8    Period  Weeks    Status  New      OT LONG TERM GOAL #3   Title  Patient will increase A/ROM of LUE to WNL to increase ability to complete tasks above shoulder level as needed.     Time  8    Period  Weeks    Status  New      OT LONG TERM GOAL #4   Title  Patient will increase LUE strength to 4+/5 in order to be able to return to work and complete required tasks with less difficulty.     Time  8    Period  Weeks    Status  New            Plan - 06/25/17 1340    Clinical Impression Statement  A: Pt is a 52 y/o male S/P left shoulder RTC repair causing increased pain, fascial retrictions and decreased ROM and strength resulting in difficulty completing daily tasks with LUE.     Occupational Profile and client history currently impacting functional performance  motivated to return to prior level of function    Occupational performance deficits (Please refer to evaluation for details):  ADL's;Work;IADL's;Rest and Sleep    Rehab Potential  Excellent    Current Impairments/barriers affecting progress:  physically demanding job, history of prior shoulder surgery.    OT Frequency  2x / week    OT Duration  8 weeks    OT Treatment/Interventions  Self-care/ADL training;Ultrasound;DME and/or AE instruction;Patient/family education;Passive range of motion;Cryotherapy;Electrical Stimulation;Moist Heat;Therapeutic exercise;Manual Therapy;Therapeutic activities    Plan  P: Patient will benefit from skilled OT services to increase functional performance during daily and work related tasks with LUE. Treatment plan: myofascial release, manual  stretching, P/ROM, AA/ROM, A/ROM, and general shoulder and scapular strengthening.     Clinical Decision Making  Limited treatment options, no task modification necessary    Consulted and Agree with Plan of Care  Patient       Patient will benefit from skilled therapeutic intervention in order to improve the following deficits and impairments:  Pain, Impaired UE functional use, Increased fascial restrictions, Decreased range of motion  Visit Diagnosis:  Other symptoms and signs involving the musculoskeletal system - Plan: Ot plan of care cert/re-cert  Acute pain of left shoulder - Plan: Ot plan of care cert/re-cert  Stiffness of left shoulder, not elsewhere classified - Plan: Ot plan of care cert/re-cert    Problem List Patient Active Problem List   Diagnosis Date Noted  . S/P rotator cuff repair 02/10/2014  . Pain in joint, shoulder region 02/10/2014  . Muscle weakness (generalized) 02/10/2014  . Decreased range of motion of right shoulder 02/10/2014   Ailene Ravel, OTR/L,CBIS  [phone]  06/25/2017, 2:33 PM  [facility] [place] [address] [place] [address] [place], [place], [address] Phone: [phone]   Fax:  [phone]  Name: Marcus Chen MRN: [id number] Date of Birth: August 26, 1965

## 2017-06-27 ENCOUNTER — Telehealth (HOSPITAL_COMMUNITY): Payer: Self-pay | Admitting: Internal Medicine

## 2017-06-27 ENCOUNTER — Encounter (HOSPITAL_COMMUNITY): Payer: Self-pay

## 2017-06-27 ENCOUNTER — Ambulatory Visit (HOSPITAL_COMMUNITY): Payer: PRIVATE HEALTH INSURANCE

## 2017-06-27 DIAGNOSIS — R29898 Other symptoms and signs involving the musculoskeletal system: Secondary | ICD-10-CM

## 2017-06-27 DIAGNOSIS — M25612 Stiffness of left shoulder, not elsewhere classified: Secondary | ICD-10-CM

## 2017-06-27 DIAGNOSIS — M25512 Pain in left shoulder: Secondary | ICD-10-CM

## 2017-06-27 NOTE — Therapy (Signed)
[place], [place], [address] Phone: [phone]   Fax:  [phone]  Occupational Therapy Treatment  Patient Details  Name: Marcus Chen MRN: [id number] Date of Birth: 1966-03-23 Referring Provider: Vickey Huger MD   Encounter Date: 06/27/2017  OT End of Session - 06/27/17 1023    Visit Number  2    Number of Visits  16    Date for OT Re-Evaluation  08/20/17 mini reassess: 07/23/17    Authorization Type  [employer]    OT Start Time  0950    OT Stop Time  1021    OT Time Calculation (min)  31 min    Activity Tolerance  Patient tolerated treatment well    Behavior During Therapy  [facility] for tasks assessed/performed       Past Medical History:  Diagnosis Date  . AC (acromioclavicular) joint bone spurs    spurs  . Allergy   . Asthma   . Gout   . Kidney stone   . Post-operative nausea and vomiting   . Shoulder pain, bilateral     Past Surgical History:  Procedure Laterality Date  . APPENDECTOMY    . ROTATOR CUFF REPAIR  2014   right/ left shoulder was done 2010  . stomach reduction  2003  . TONSILLECTOMY      There were no vitals filed for this visit.  Subjective Assessment - 06/27/17 1012    Subjective   S: Everything just feels tight this morning.    Currently in Pain?  No/denies         [facility] OT Assessment - 06/27/17 1013      Assessment   Medical Diagnosis  left RTC repair, SAD, SA      Precautions   Precautions  Shoulder    Type of Shoulder Precautions  Standard RTC repair protocol. P/ROM for 4 weeks. Begin AA/ROM on 07/11/17. Begin A/ROM on 08/08/17                 OT Treatments/Exercises (OP) - 06/27/17 1013      Exercises   Exercises  Shoulder      Shoulder Exercises: Supine   Protraction  PROM;10 reps    Horizontal ABduction  PROM;10 reps    External Rotation  PROM;10 reps abducted    Internal Rotation  PROM;10 reps abducted    Flexion  PROM;10 reps    ABduction  PROM;10  reps      Shoulder Exercises: Seated   Row  AROM;10 reps    Other Seated Exercises  Scapular depression; A/ROM, 10X      Shoulder Exercises: Therapy Ball   Flexion  -- 12X    ABduction  -- 12X      Shoulder Exercises: ROM/Strengthening   Thumb Tacks  1'    Anterior Glide  3x10"    Prot/Ret//Elev/Dep  1'      Shoulder Exercises: Isometric Strengthening   Flexion  Supine;5X10"    Extension  Supine;5X10"    External Rotation  Supine;5X10"    Internal Rotation  Supine;5X10"    ABduction  Supine;5X10"    ADduction  Supine;5X10"             OT Education - 06/27/17 1021    Education provided  Yes    Education Details  Discussed typical length of time for wearing the sling. Pt provided with OT evaluation hand out.    Person(s) Educated  Patient    Methods  Explanation;Handout    Comprehension  Verbalized understanding       OT Short Term Goals - 06/27/17 1024      OT SHORT TERM GOAL #1   Title  Patient will be educated and independent with HEP to increase functional performance during daily tasks using LUE.    Time  4    Period  Weeks    Status  On-going      OT SHORT TERM GOAL #2   Title  patient will increase A/ROM to [facility] to increase ability to get dressed with less difficulty.    Time  4    Period  Weeks    Status  On-going      OT SHORT TERM GOAL #3   Title  Patient will decrease fascial restrictions to trace amount or less to increase functional mobility in LUE needed for daily tasks.     Time  4    Period  Weeks    Status  On-going      OT SHORT TERM GOAL #4   Title  Patient will increase LUE strength to 3+/5 to increase ability to complete tasks at shoulder height with less difficulty.     Time  4    Period  Weeks    Status  On-going      OT SHORT TERM GOAL #5   Title  Patient will decrease pain level with shoulder movements and daily tasks to 4/10 or less.     Time  4    Period  Weeks    Status  On-going        OT Long Term Goals - 06/27/17  1025      OT LONG TERM GOAL #1   Title  Patient will return to highest level of independence with all daily tasks while able to return to work using his LUE as his non-dominant.     Time  8    Period  Weeks    Status  On-going      OT LONG TERM GOAL #2   Title  Patient will report a decreased pain level during daily and work related tasks of 2/10 or less.     Time  8    Period  Weeks    Status  On-going      OT LONG TERM GOAL #3   Title  Patient will increase A/ROM of LUE to WNL to increase ability to complete tasks above shoulder level as needed.     Time  8    Period  Weeks    Status  On-going      OT LONG TERM GOAL #4   Title  Patient will increase LUE strength to 4+/5 in order to be able to return to work and complete required tasks with less difficulty.     Time  8    Period  Weeks    Status  On-going      OT LONG TERM GOAL #5   Status  On-going            Plan - 06/27/17 1023    Clinical Impression Statement  A: Initiated myofascial release, manual stretching, isometric strengthening, and therapy ball stretches. patient was able to achieve full passive ROM this session. VC for form and technique with mild pain/discomfort during exercises.     Plan  P: Continue with P/ROM.     Consulted and Agree with Plan of Care  Patient       Patient will  benefit from skilled therapeutic intervention in order to improve the following deficits and impairments:  Pain, Impaired UE functional use, Increased fascial restrictions, Decreased range of motion  Visit Diagnosis: Other symptoms and signs involving the musculoskeletal system  Acute pain of left shoulder  Stiffness of left shoulder, not elsewhere classified    Problem List Patient Active Problem List   Diagnosis Date Noted  . S/P rotator cuff repair 02/10/2014  . Pain in joint, shoulder region 02/10/2014  . Muscle weakness (generalized) 02/10/2014  . Decreased range of motion of right shoulder 02/10/2014    Ailene Ravel, OTR/L,CBIS  [phone]  06/27/2017, 10:26 AM  [place] [address] [place], [place], [address] Phone: [phone]   Fax:  [phone]  Name: DWYNE HASEGAWA MRN: [id number] Date of Birth: [birth year]

## 2017-06-27 NOTE — Telephone Encounter (Signed)
06/27/17  pt asked if he could change the appt... he was rescheduled for 1/31 at 4:00

## 2017-06-28 ENCOUNTER — Ambulatory Visit (HOSPITAL_COMMUNITY): Payer: PRIVATE HEALTH INSURANCE

## 2017-06-28 DIAGNOSIS — M25612 Stiffness of left shoulder, not elsewhere classified: Secondary | ICD-10-CM

## 2017-06-28 DIAGNOSIS — R29898 Other symptoms and signs involving the musculoskeletal system: Secondary | ICD-10-CM | POA: Diagnosis not present

## 2017-06-28 DIAGNOSIS — M25512 Pain in left shoulder: Secondary | ICD-10-CM

## 2017-06-28 NOTE — Therapy (Signed)
[place], [place], [address] Phone: [phone]   Fax:  [phone]  Occupational Therapy Treatment  Patient Details  Name: TREA LATNER MRN: [id number] Date of Birth: 1965/08/02 Referring Provider: Vickey Huger MD   Encounter Date: 06/28/2017  OT End of Session - 06/28/17 1722    Visit Number  3    Number of Visits  16    Date for OT Re-Evaluation  08/20/17 mini reassess: 07/23/17    Authorization Type  [employer]    OT Start Time  1605    OT Stop Time  1640    OT Time Calculation (min)  35 min    Activity Tolerance  Patient tolerated treatment well    Behavior During Therapy  [facility] for tasks assessed/performed       Past Medical History:  Diagnosis Date  . AC (acromioclavicular) joint bone spurs    spurs  . Allergy   . Asthma   . Gout   . Kidney stone   . Post-operative nausea and vomiting   . Shoulder pain, bilateral     Past Surgical History:  Procedure Laterality Date  . APPENDECTOMY    . ROTATOR CUFF REPAIR  2014   right/ left shoulder was done 2010  . stomach reduction  2003  . TONSILLECTOMY      There were no vitals filed for this visit.  Subjective Assessment - 06/27/17 1012    Subjective   S: Everything just feels tight this morning.    Currently in Pain?  No/denies         [facility] OT Assessment - 06/28/17 1713      Assessment   Medical Diagnosis  left RTC repair, SAD, SA      Precautions   Precautions  Shoulder    Type of Shoulder Precautions  Standard RTC repair protocol. P/ROM for 4 weeks. Begin AA/ROM on 07/11/17. Begin A/ROM on 08/08/17                 OT Treatments/Exercises (OP) - 06/28/17 1713      Exercises   Exercises  Shoulder      Shoulder Exercises: Supine   Protraction  PROM;10 reps    Horizontal ABduction  PROM;10 reps    External Rotation  PROM;10 reps    Internal Rotation  PROM;10 reps    Flexion  PROM;10 reps    ABduction  PROM;10 reps      Shoulder Exercises: Seated   Row  AROM;10 reps    Other Seated Exercises  Scapular depression; A/ROM, 10X      Shoulder Exercises: ROM/Strengthening   Thumb Tacks  1'    Anterior Glide  3x15"    Prot/Ret//Elev/Dep  1'      Shoulder Exercises: Isometric Strengthening   Flexion  Supine;5X10"    Extension  Supine;5X10"    External Rotation  Supine;5X10"    Internal Rotation  Supine;5X10"    ABduction  Supine;5X10"    ADduction  Supine;5X10"      Manual Therapy   Manual Therapy  Myofascial release    Manual therapy comments  Manual therapy completed prior to exercises.    Myofascial Release  Myofascial release and manual stretching completed to left upper arm, trapezius, and scapularis region to decrease fascial restrictions and increase joint mobility in a pain free zone.              OT Education - 06/27/17 1021    Education  provided  Yes    Education Details  Discussed typical length of time for wearing the sling. Pt provided with OT evaluation hand out.    Person(s) Educated  Patient    Methods  Explanation;Handout    Comprehension  Verbalized understanding       OT Short Term Goals - 06/27/17 1024      OT SHORT TERM GOAL #1   Title  Patient will be educated and independent with HEP to increase functional performance during daily tasks using LUE.    Time  4    Period  Weeks    Status  On-going      OT SHORT TERM GOAL #2   Title  patient will increase A/ROM to [facility] to increase ability to get dressed with less difficulty.    Time  4    Period  Weeks    Status  On-going      OT SHORT TERM GOAL #3   Title  Patient will decrease fascial restrictions to trace amount or less to increase functional mobility in LUE needed for daily tasks.     Time  4    Period  Weeks    Status  On-going      OT SHORT TERM GOAL #4   Title  Patient will increase LUE strength to 3+/5 to increase ability to complete tasks at shoulder height with less difficulty.     Time  4    Period   Weeks    Status  On-going      OT SHORT TERM GOAL #5   Title  Patient will decrease pain level with shoulder movements and daily tasks to 4/10 or less.     Time  4    Period  Weeks    Status  On-going        OT Long Term Goals - 06/27/17 1025      OT LONG TERM GOAL #1   Title  Patient will return to highest level of independence with all daily tasks while able to return to work using his LUE as his non-dominant.     Time  8    Period  Weeks    Status  On-going      OT LONG TERM GOAL #2   Title  Patient will report a decreased pain level during daily and work related tasks of 2/10 or less.     Time  8    Period  Weeks    Status  On-going      OT LONG TERM GOAL #3   Title  Patient will increase A/ROM of LUE to WNL to increase ability to complete tasks above shoulder level as needed.     Time  8    Period  Weeks    Status  On-going      OT LONG TERM GOAL #4   Title  Patient will increase LUE strength to 4+/5 in order to be able to return to work and complete required tasks with less difficulty.     Time  8    Period  Weeks    Status  On-going      OT LONG TERM GOAL #5   Status  On-going            Plan - 06/28/17 1722    Clinical Impression Statement  A: Increased isometric hold time. patient reported mild soreness from yesterday's session although was able to complete all exercises with verbal cueing for form and technique.  patient is progressing well with plan of care.     Plan  P: Continue to follow standard RCT repair protocol. Add Caudel glide       Patient will benefit from skilled therapeutic intervention in order to improve the following deficits and impairments:  Pain, Impaired UE functional use, Increased fascial restrictions, Decreased range of motion  Visit Diagnosis: Other symptoms and signs involving the musculoskeletal system  Acute pain of left shoulder  Stiffness of left shoulder, not elsewhere classified    Problem List Patient Active  Problem List   Diagnosis Date Noted  . S/P rotator cuff repair 02/10/2014  . Pain in joint, shoulder region 02/10/2014  . Muscle weakness (generalized) 02/10/2014  . Decreased range of motion of right shoulder 02/10/2014   Ailene Ravel, OTR/L,CBIS  [phone]  06/28/2017, 5:26 PM  [place] [address] [place], [place], [address] Phone: [phone]   Fax:  [phone]  Name: MCCALL LOMAX MRN: [id number] Date of Birth: Jun 17, 1965

## 2017-06-29 ENCOUNTER — Encounter (HOSPITAL_COMMUNITY): Payer: PRIVATE HEALTH INSURANCE

## 2017-07-02 ENCOUNTER — Ambulatory Visit (HOSPITAL_COMMUNITY): Payer: PRIVATE HEALTH INSURANCE | Admitting: Specialist

## 2017-07-02 ENCOUNTER — Telehealth (HOSPITAL_COMMUNITY): Payer: Self-pay | Admitting: Internal Medicine

## 2017-07-02 NOTE — Telephone Encounter (Signed)
07/02/17  pt called and asked that we cx this appt - no reason was given

## 2017-07-04 ENCOUNTER — Ambulatory Visit (HOSPITAL_COMMUNITY): Payer: PRIVATE HEALTH INSURANCE | Attending: Orthopedic Surgery

## 2017-07-04 ENCOUNTER — Other Ambulatory Visit: Payer: Self-pay

## 2017-07-04 ENCOUNTER — Telehealth (HOSPITAL_COMMUNITY): Payer: Self-pay

## 2017-07-04 DIAGNOSIS — M25512 Pain in left shoulder: Secondary | ICD-10-CM | POA: Diagnosis present

## 2017-07-04 DIAGNOSIS — M25612 Stiffness of left shoulder, not elsewhere classified: Secondary | ICD-10-CM | POA: Diagnosis present

## 2017-07-04 DIAGNOSIS — R29898 Other symptoms and signs involving the musculoskeletal system: Secondary | ICD-10-CM | POA: Diagnosis not present

## 2017-07-04 NOTE — Telephone Encounter (Signed)
Phone call placed to Dr. Ruel Favors office to inquire if MD is ok with progressing patient to AA/ROM exercises. Message placed for Dr. Ronnie Derby and office will return call with answer for treating therapist.   Ailene Ravel, OTR/L,CBIS  [phone]

## 2017-07-04 NOTE — Therapy (Signed)
[place], [place], [address] Phone: [phone]   Fax:  [phone]  Occupational Therapy Treatment  Patient Details  Name: Marcus Chen MRN: [id number] Date of Birth: [birth year] Referring Provider: Vickey Huger MD   Encounter Date: 07/04/2017  OT End of Session - 07/04/17 1029    Visit Number  4    Number of Visits  16    Date for OT Re-Evaluation  08/20/17 mini reassess: 07/23/17    Authorization Type  [employer]    OT Start Time  0907    OT Stop Time  0945    OT Time Calculation (min)  38 min    Activity Tolerance  Patient tolerated treatment well    Behavior During Therapy  [facility] for tasks assessed/performed       Past Medical History:  Diagnosis Date  . AC (acromioclavicular) joint bone spurs    spurs  . Allergy   . Asthma   . Gout   . Kidney stone   . Post-operative nausea and vomiting   . Shoulder pain, bilateral     Past Surgical History:  Procedure Laterality Date  . APPENDECTOMY    . ROTATOR CUFF REPAIR  2014   right/ left shoulder was done 2010  . stomach reduction  2003  . TONSILLECTOMY      There were no vitals filed for this visit.      [facility] OT Assessment - 07/04/17 0929      Assessment   Medical Diagnosis  left RTC repair, SAD, SA      Precautions   Precautions  Shoulder    Type of Shoulder Precautions  Standard RTC repair protocol. P/ROM for 4 weeks. Begin AA/ROM on 07/11/17. Begin A/ROM on 08/08/17                 OT Treatments/Exercises (OP) - 07/04/17 0932      Exercises   Exercises  Shoulder      Shoulder Exercises: Supine   Protraction  PROM;10 reps    Horizontal ABduction  PROM;10 reps    External Rotation  PROM;10 reps    Internal Rotation  PROM;10 reps    Flexion  PROM;10 reps    ABduction  PROM;10 reps    Other Supine Exercises  Serratus Anterior Punch; 10X      Shoulder Exercises: Therapy Ball   Flexion  15 reps    ABduction  15 reps       Shoulder Exercises: ROM/Strengthening   Thumb Tacks  1'    Anterior Glide  3x15"    Prot/Ret//Elev/Dep  1'      Shoulder Exercises: Isometric Strengthening   Flexion  Supine 5x15"    Extension  Supine 5x15"    External Rotation  Supine 5x15"    Internal Rotation  Supine 5x15'    ABduction  Supine 5x15'    ADduction  Supine 5x15"      Manual Therapy   Manual Therapy  Myofascial release    Manual therapy comments  Manual therapy completed prior to exercises.    Myofascial Release  Myofascial release and manual stretching completed to left upper arm, trapezius, and scapularis region to decrease fascial restrictions and increase joint mobility in a pain free zone.                OT Short Term Goals - 06/27/17 1024      OT SHORT TERM GOAL #1   Title  Patient  will be educated and independent with HEP to increase functional performance during daily tasks using LUE.    Time  4    Period  Weeks    Status  On-going      OT SHORT TERM GOAL #2   Title  patient will increase A/ROM to [facility] to increase ability to get dressed with less difficulty.    Time  4    Period  Weeks    Status  On-going      OT SHORT TERM GOAL #3   Title  Patient will decrease fascial restrictions to trace amount or less to increase functional mobility in LUE needed for daily tasks.     Time  4    Period  Weeks    Status  On-going      OT SHORT TERM GOAL #4   Title  Patient will increase LUE strength to 3+/5 to increase ability to complete tasks at shoulder height with less difficulty.     Time  4    Period  Weeks    Status  On-going      OT SHORT TERM GOAL #5   Title  Patient will decrease pain level with shoulder movements and daily tasks to 4/10 or less.     Time  4    Period  Weeks    Status  On-going        OT Long Term Goals - 06/27/17 1025      OT LONG TERM GOAL #1   Title  Patient will return to highest level of independence with all daily tasks while able to return to work using his  LUE as his non-dominant.     Time  8    Period  Weeks    Status  On-going      OT LONG TERM GOAL #2   Title  Patient will report a decreased pain level during daily and work related tasks of 2/10 or less.     Time  8    Period  Weeks    Status  On-going      OT LONG TERM GOAL #3   Title  Patient will increase A/ROM of LUE to WNL to increase ability to complete tasks above shoulder level as needed.     Time  8    Period  Weeks    Status  On-going      OT LONG TERM GOAL #4   Title  Patient will increase LUE strength to 4+/5 in order to be able to return to work and complete required tasks with less difficulty.     Time  8    Period  Weeks    Status  On-going      OT LONG TERM GOAL #5   Status  On-going            Plan - 07/04/17 1029    Clinical Impression Statement  A: Increased hold time for isometric. Added serratus anterior punch to increase scapular mobility. Patient progressing well with all exercises with minimal pain. Therapist to contact MD to see if it's ok to progress to AA/ROM earlier than next week.     Plan  P: If MD Ok's it progress to AA/ROM. Otherwise continue with P/ROM.        Patient will benefit from skilled therapeutic intervention in order to improve the following deficits and impairments:  Pain, Impaired UE functional use, Increased fascial restrictions, Decreased range of motion  Visit Diagnosis: Other symptoms  and signs involving the musculoskeletal system  Acute pain of left shoulder  Stiffness of left shoulder, not elsewhere classified    Problem List Patient Active Problem List   Diagnosis Date Noted  . S/P rotator cuff repair 02/10/2014  . Pain in joint, shoulder region 02/10/2014  . Muscle weakness (generalized) 02/10/2014  . Decreased range of motion of right shoulder 02/10/2014   Ailene Ravel, OTR/L,CBIS  [phone]  07/04/2017, 10:32 AM  [place], [place], [address] Phone: [phone]   Fax:  [phone]  Name: RYLON POITRA MRN: [id number] Date of Birth: 18-Jul-1965

## 2017-07-06 ENCOUNTER — Other Ambulatory Visit: Payer: Self-pay

## 2017-07-06 ENCOUNTER — Encounter (HOSPITAL_COMMUNITY): Payer: Self-pay

## 2017-07-06 ENCOUNTER — Ambulatory Visit (HOSPITAL_COMMUNITY): Payer: PRIVATE HEALTH INSURANCE

## 2017-07-06 DIAGNOSIS — M25612 Stiffness of left shoulder, not elsewhere classified: Secondary | ICD-10-CM

## 2017-07-06 DIAGNOSIS — M25512 Pain in left shoulder: Secondary | ICD-10-CM

## 2017-07-06 DIAGNOSIS — R29898 Other symptoms and signs involving the musculoskeletal system: Secondary | ICD-10-CM | POA: Diagnosis not present

## 2017-07-06 NOTE — Therapy (Signed)
[place], [place], [address] Phone: [phone]   Fax:  [phone]  Occupational Therapy Treatment  Patient Details  Name: Marcus Chen MRN: [id number] Date of Birth: 1965-07-21 Referring Provider: Vickey Huger MD   Encounter Date: 07/06/2017  OT End of Session - 07/06/17 1113    Visit Number  5    Number of Visits  16    Date for OT Re-Evaluation  08/20/17 mini reassess: 07/23/17    Authorization Type  [employer]    OT Start Time  1040    OT Stop Time  1113    OT Time Calculation (min)  33 min    Activity Tolerance  Patient tolerated treatment well    Behavior During Therapy  [facility] for tasks assessed/performed       Past Medical History:  Diagnosis Date  . AC (acromioclavicular) joint bone spurs    spurs  . Allergy   . Asthma   . Gout   . Kidney stone   . Post-operative nausea and vomiting   . Shoulder pain, bilateral     Past Surgical History:  Procedure Laterality Date  . APPENDECTOMY    . ROTATOR CUFF REPAIR  2014   right/ left shoulder was done 2010  . stomach reduction  2003  . TONSILLECTOMY      There were no vitals filed for this visit.  Subjective Assessment - 07/06/17 1108    Subjective   S: I'm probably doing too much with it.     Currently in Pain?  No/denies         [facility] OT Assessment - 07/06/17 1108      Assessment   Medical Diagnosis  left RTC repair, SAD, SA      Precautions   Precautions  Shoulder    Type of Shoulder Precautions  Standard RTC repair protocol. P/ROM for 4 weeks. Begin AA/ROM on 07/11/17. Begin A/ROM on 08/08/17                 OT Treatments/Exercises (OP) - 07/06/17 1109      Exercises   Exercises  Shoulder      Shoulder Exercises: Supine   Protraction  PROM;10 reps    Horizontal ABduction  PROM;10 reps    External Rotation  PROM;10 reps    Internal Rotation  PROM;10 reps    Flexion  PROM;10 reps    ABduction  PROM;10 reps    Other Supine  Exercises  Serratus Anterior Punch; 12X      Shoulder Exercises: Therapy Ball   Flexion  15 reps    ABduction  15 reps      Shoulder Exercises: ROM/Strengthening   Thumb Tacks  1'    Prot/Ret//Elev/Dep  1'      Shoulder Exercises: Isometric Strengthening   Flexion  Supine 5x15"    Extension  Supine 5x15"    External Rotation  Supine 5x15"    Internal Rotation  Supine 5x15'    ABduction  Supine 5x15"    ADduction  Supine 5x15"      Manual Therapy   Manual Therapy  Myofascial release    Manual therapy comments  Manual therapy completed prior to exercises.    Myofascial Release  Myofascial release and manual stretching completed to left upper arm, trapezius, and scapularis region to decrease fascial restrictions and increase joint mobility in a pain free zone.  OT Short Term Goals - 06/27/17 1024      OT SHORT TERM GOAL #1   Title  Patient will be educated and independent with HEP to increase functional performance during daily tasks using LUE.    Time  4    Period  Weeks    Status  On-going      OT SHORT TERM GOAL #2   Title  patient will increase A/ROM to [facility] to increase ability to get dressed with less difficulty.    Time  4    Period  Weeks    Status  On-going      OT SHORT TERM GOAL #3   Title  Patient will decrease fascial restrictions to trace amount or less to increase functional mobility in LUE needed for daily tasks.     Time  4    Period  Weeks    Status  On-going      OT SHORT TERM GOAL #4   Title  Patient will increase LUE strength to 3+/5 to increase ability to complete tasks at shoulder height with less difficulty.     Time  4    Period  Weeks    Status  On-going      OT SHORT TERM GOAL #5   Title  Patient will decrease pain level with shoulder movements and daily tasks to 4/10 or less.     Time  4    Period  Weeks    Status  On-going        OT Long Term Goals - 06/27/17 1025      OT LONG TERM GOAL #1   Title  Patient  will return to highest level of independence with all daily tasks while able to return to work using his LUE as his non-dominant.     Time  8    Period  Weeks    Status  On-going      OT LONG TERM GOAL #2   Title  Patient will report a decreased pain level during daily and work related tasks of 2/10 or less.     Time  8    Period  Weeks    Status  On-going      OT LONG TERM GOAL #3   Title  Patient will increase A/ROM of LUE to WNL to increase ability to complete tasks above shoulder level as needed.     Time  8    Period  Weeks    Status  On-going      OT LONG TERM GOAL #4   Title  Patient will increase LUE strength to 4+/5 in order to be able to return to work and complete required tasks with less difficulty.     Time  8    Period  Weeks    Status  On-going      OT LONG TERM GOAL #5   Status  On-going            Plan - 07/06/17 1113    Clinical Impression Statement  A: PA denied request to increase to AA/ROM at this time. Continued with standard protocol for RTC repair. Min VC for form and technique.     OT Treatment/Interventions  Self-care/ADL training;Ultrasound;DME and/or AE instruction;Patient/family education;Passive range of motion;Cryotherapy;Electrical Stimulation;Moist Heat;Therapeutic exercise;Manual Therapy;Therapeutic activities    Plan  P: Continue with P/ROM unitl the 13th.    Consulted and Agree with Plan of Care  Patient       Patient will  benefit from skilled therapeutic intervention in order to improve the following deficits and impairments:     Visit Diagnosis: Other symptoms and signs involving the musculoskeletal system  Acute pain of left shoulder  Stiffness of left shoulder, not elsewhere classified    Problem List Patient Active Problem List   Diagnosis Date Noted  . S/P rotator cuff repair 02/10/2014  . Pain in joint, shoulder region 02/10/2014  . Muscle weakness (generalized) 02/10/2014  . Decreased range of motion of right  shoulder 02/10/2014   Ailene Ravel, OTR/L,CBIS  [phone]  07/06/2017, 11:17 AM  [facility] [address] [place], [place], [address] Phone: [phone]   Fax:  [phone]  Name: Marcus Chen MRN: [id number] Date of Birth: 16-Sep-1965

## 2017-07-09 ENCOUNTER — Encounter (HOSPITAL_COMMUNITY): Payer: Self-pay | Admitting: Specialist

## 2017-07-09 ENCOUNTER — Ambulatory Visit (HOSPITAL_COMMUNITY): Payer: PRIVATE HEALTH INSURANCE | Admitting: Specialist

## 2017-07-09 DIAGNOSIS — M25512 Pain in left shoulder: Secondary | ICD-10-CM

## 2017-07-09 DIAGNOSIS — R29898 Other symptoms and signs involving the musculoskeletal system: Secondary | ICD-10-CM

## 2017-07-09 DIAGNOSIS — M25612 Stiffness of left shoulder, not elsewhere classified: Secondary | ICD-10-CM

## 2017-07-09 NOTE — Therapy (Signed)
[place], [place], [address] Phone: [phone]   Fax:  [phone]  Occupational Therapy Treatment  Patient Details  Name: Marcus Chen MRN: [id number] Date of Birth: Jul 24, 1965 Referring Provider: Vickey Huger MD   Encounter Date: 07/09/2017  OT End of Session - 07/09/17 1123    Visit Number  6    Number of Visits  16    Date for OT Re-Evaluation  08/20/17 mini reassess on 07/23/17    Authorization Type  [employer]    OT Start Time  1042    OT Stop Time  1120    OT Time Calculation (min)  38 min    Activity Tolerance  Patient tolerated treatment well    Behavior During Therapy  [facility] for tasks assessed/performed       Past Medical History:  Diagnosis Date  . AC (acromioclavicular) joint bone spurs    spurs  . Allergy   . Asthma   . Gout   . Kidney stone   . Post-operative nausea and vomiting   . Shoulder pain, bilateral     Past Surgical History:  Procedure Laterality Date  . APPENDECTOMY    . ROTATOR CUFF REPAIR  2014   right/ left shoulder was done 2010  . stomach reduction  2003  . TONSILLECTOMY      There were no vitals filed for this visit.  Subjective Assessment - 07/09/17 1122    Subjective   S:  I cant wait to start doing the broomstick exercises.    Currently in Pain?  No/denies    Pain Score  0-No pain                   OT Treatments/Exercises (OP) - 07/09/17 0001      Shoulder Exercises: Supine   Protraction  PROM;10 reps    Horizontal ABduction  PROM;10 reps    External Rotation  PROM;10 reps    Internal Rotation  PROM;10 reps    Flexion  PROM;10 reps    ABduction  PROM;10 reps    Other Supine Exercises  Serratus Anterior Punch; 15X      Shoulder Exercises: Seated   Elevation  AROM;15 reps    Extension  AROM;15 reps    Row  AROM;15 reps      Shoulder Exercises: Therapy Ball   Flexion  20 reps    ABduction  20 reps      Shoulder Exercises:  ROM/Strengthening   Thumb Tacks  1'    Anterior Glide  3x15"    Caudal Glide  15" X 5    Prot/Ret//Elev/Dep  1'      Shoulder Exercises: Isometric Strengthening   Flexion  Supine 30" X 2    Extension  Supine 30" X 2    External Rotation  Supine 30" X 2    Internal Rotation  Supine 30" X 2    ABduction  Supine 30" X 2    ADduction  Supine 30" X 2      Manual Therapy   Manual Therapy  Myofascial release    Manual therapy comments  Manual therapy completed prior to exercises.    Myofascial Release  Myofascial release and manual stretching completed to left upper arm, trapezius, and scapularis region to decrease fascial restrictions and increase joint mobility in a pain free zone.                OT Short Term Goals -  07/09/17 1125      OT SHORT TERM GOAL #1   Title  Patient will be educated and independent with HEP to increase functional performance during daily tasks using LUE.    Time  4    Period  Weeks    Status  On-going      OT SHORT TERM GOAL #2   Title  patient will increase A/ROM to [facility] to increase ability to get dressed with less difficulty.    Time  4    Period  Weeks    Status  On-going      OT SHORT TERM GOAL #3   Title  Patient will decrease fascial restrictions to trace amount or less to increase functional mobility in LUE needed for daily tasks.     Time  4    Period  Weeks    Status  On-going      OT SHORT TERM GOAL #4   Title  Patient will increase LUE strength to 3+/5 to increase ability to complete tasks at shoulder height with less difficulty.     Time  4    Period  Weeks    Status  On-going      OT SHORT TERM GOAL #5   Title  Patient will decrease pain level with shoulder movements and daily tasks to 4/10 or less.     Time  4    Period  Weeks    Status  On-going        OT Long Term Goals - 06/27/17 1025      OT LONG TERM GOAL #1   Title  Patient will return to highest level of independence with all daily tasks while able to return  to work using his LUE as his non-dominant.     Time  8    Period  Weeks    Status  On-going      OT LONG TERM GOAL #2   Title  Patient will report a decreased pain level during daily and work related tasks of 2/10 or less.     Time  8    Period  Weeks    Status  On-going      OT LONG TERM GOAL #3   Title  Patient will increase A/ROM of LUE to WNL to increase ability to complete tasks above shoulder level as needed.     Time  8    Period  Weeks    Status  On-going      OT LONG TERM GOAL #4   Title  Patient will increase LUE strength to 4+/5 in order to be able to return to work and complete required tasks with less difficulty.     Time  8    Period  Weeks    Status  On-going      OT LONG TERM GOAL #5   Status  On-going            Plan - 07/09/17 1123    Clinical Impression Statement  A:  Patient demonstrating full P/ROM this date with soft end feel.  Able to increase to 30" contraction time with isometrics.  Required verbal guidance and tactile cues with prot/ret/elev/dep for technique and form, as well as serratus anterior punch.      OT Treatment/Interventions  Self-care/ADL training;Ultrasound;DME and/or AE instruction;Patient/family education;Passive range of motion;Cryotherapy;Electrical Stimulation;Moist Heat;Therapeutic exercise;Manual Therapy;Therapeutic activities    Plan  P:  begin AA/ROM in supine, with education on proper form to avoid injury.  Patient will benefit from skilled therapeutic intervention in order to improve the following deficits and impairments:  Pain, Impaired UE functional use, Increased fascial restrictions, Decreased range of motion  Visit Diagnosis: Other symptoms and signs involving the musculoskeletal system  Acute pain of left shoulder  Stiffness of left shoulder, not elsewhere classified    Problem List Patient Active Problem List   Diagnosis Date Noted  . S/P rotator cuff repair 02/10/2014  . Pain in joint, shoulder  region 02/10/2014  . Muscle weakness (generalized) 02/10/2014  . Decreased range of motion of right shoulder 02/10/2014    Vangie Bicker, [place], OTR/L [phone]  07/09/2017, 11:26 AM  [facility] [address] [place], [place], [address] Phone: [phone]   Fax:  [phone]  Name: Marcus Chen MRN: [id number] Date of Birth: [birth year]

## 2017-07-11 ENCOUNTER — Other Ambulatory Visit: Payer: Self-pay

## 2017-07-11 ENCOUNTER — Encounter (HOSPITAL_COMMUNITY): Payer: Self-pay

## 2017-07-11 ENCOUNTER — Ambulatory Visit (HOSPITAL_COMMUNITY): Payer: PRIVATE HEALTH INSURANCE

## 2017-07-11 DIAGNOSIS — R29898 Other symptoms and signs involving the musculoskeletal system: Secondary | ICD-10-CM | POA: Diagnosis not present

## 2017-07-11 DIAGNOSIS — M25612 Stiffness of left shoulder, not elsewhere classified: Secondary | ICD-10-CM

## 2017-07-11 DIAGNOSIS — M25512 Pain in left shoulder: Secondary | ICD-10-CM

## 2017-07-11 NOTE — Patient Instructions (Signed)
Perform each exercise _____10-12___ reps. 2-3x days.   Protraction - STANDING  Start by holding a wand or cane at chest height.  Next, slowly push the wand outwards in front of your body so that your elbows become fully straightened. Then, return to the original position.     Shoulder FLEXION - STANDING - PALMS DOWN  In the standing position, hold a wand/cane with both arms, palms down on both sides. Raise up the wand/cane allowing your unaffected arm to perform most of the effort. Your affected arm should be partially relaxed.      Internal/External ROTATION - STANDING  In the standing position, hold a wand/cane with both hands keeping your elbows bent. Move your arms and wand/cane to one side.  Your affected arm should be partially relaxed while your unaffected arm performs most of the effort.       Shoulder ABDUCTION - STANDING  While holding a wand/cane palm face up on the injured side and palm face down on the uninjured side, slowly raise up your injured arm to the side.       Horizontal Abduction/Adduction      Straight arms holding cane at shoulder height, bring cane to right, center, left. Repeat starting to left.   Copyright  [employer]. All rights reserved.       

## 2017-07-11 NOTE — Therapy (Signed)
[name], [place], [address] Phone: [phone]   Fax:  [phone]  Occupational Therapy Treatment  Patient Details  Name: Marcus Chen MRN: [id number] Date of Birth: [birth year] Referring Provider: Vickey Huger MD   Encounter Date: 07/11/2017  OT End of Session - 07/11/17 1054    Visit Number  7    Number of Visits  16    Date for OT Re-Evaluation  08/20/17 mini reassess on 07/23/17    Authorization Type  [employer]    OT Start Time  1033    OT Stop Time  1110    OT Time Calculation (min)  37 min    Activity Tolerance  Patient tolerated treatment well    Behavior During Therapy  [facility] for tasks assessed/performed       Past Medical History:  Diagnosis Date  . AC (acromioclavicular) joint bone spurs    spurs  . Allergy   . Asthma   . Gout   . Kidney stone   . Post-operative nausea and vomiting   . Shoulder pain, bilateral     Past Surgical History:  Procedure Laterality Date  . APPENDECTOMY    . ROTATOR CUFF REPAIR  2014   right/ left shoulder was done 2010  . stomach reduction  2003  . TONSILLECTOMY      There were no vitals filed for this visit.  Subjective Assessment - 07/11/17 1050    Subjective   S: No pain. Just stiffness today.    Currently in Pain?  No/denies         [facility] OT Assessment - 07/11/17 1050      Assessment   Medical Diagnosis  left RTC repair, SAD, SA      Precautions   Precautions  Shoulder    Type of Shoulder Precautions  Standard RTC repair protocol. P/ROM for 4 weeks. Begin AA/ROM on 07/11/17. Begin A/ROM on 08/08/17                 OT Treatments/Exercises (OP) - 07/11/17 1051      Exercises   Exercises  Shoulder      Shoulder Exercises: Supine   Protraction  PROM;10 reps;AAROM;12 reps    Horizontal ABduction  PROM;5 reps;AAROM;12 reps    External Rotation  PROM;5 reps;AAROM;12 reps    Internal Rotation  PROM;5 reps;AAROM;12 reps    Flexion  PROM;5  reps;AAROM;12 reps    ABduction  PROM;5 reps;AAROM;12 reps      Shoulder Exercises: Standing   Protraction  AAROM;10 reps    Horizontal ABduction  AAROM;10 reps    Flexion  AAROM;10 reps    ABduction  AAROM;10 reps      Shoulder Exercises: Pulleys   Flexion  1 minute    ABduction  1 minute      Shoulder Exercises: ROM/Strengthening   Wall Wash  1'    Thumb Tacks  1'    Proximal Shoulder Strengthening, Supine  10X    Proximal Shoulder Strengthening, Seated  10X      Manual Therapy   Manual Therapy  Myofascial release    Manual therapy comments  Manual therapy completed prior to exercises.    Myofascial Release  Myofascial release and manual stretching completed to left upper arm, trapezius, and scapularis region to decrease fascial restrictions and increase joint mobility in a pain free zone.              OT Education - 07/11/17  1053    Education provided  Yes    Education Details  AA/ROM shoulder exercises    Person(s) Educated  Patient    Methods  Explanation;Demonstration;Handout;Verbal cues    Comprehension  Verbalized understanding;Returned demonstration       OT Short Term Goals - 07/09/17 1125      OT SHORT TERM GOAL #1   Title  Patient will be educated and independent with HEP to increase functional performance during daily tasks using LUE.    Time  4    Period  Weeks    Status  On-going      OT SHORT TERM GOAL #2   Title  patient will increase A/ROM to [facility] to increase ability to get dressed with less difficulty.    Time  4    Period  Weeks    Status  On-going      OT SHORT TERM GOAL #3   Title  Patient will decrease fascial restrictions to trace amount or less to increase functional mobility in LUE needed for daily tasks.     Time  4    Period  Weeks    Status  On-going      OT SHORT TERM GOAL #4   Title  Patient will increase LUE strength to 3+/5 to increase ability to complete tasks at shoulder height with less difficulty.     Time  4     Period  Weeks    Status  On-going      OT SHORT TERM GOAL #5   Title  Patient will decrease pain level with shoulder movements and daily tasks to 4/10 or less.     Time  4    Period  Weeks    Status  On-going        OT Long Term Goals - 06/27/17 1025      OT LONG TERM GOAL #1   Title  Patient will return to highest level of independence with all daily tasks while able to return to work using his LUE as his non-dominant.     Time  8    Period  Weeks    Status  On-going      OT LONG TERM GOAL #2   Title  Patient will report a decreased pain level during daily and work related tasks of 2/10 or less.     Time  8    Period  Weeks    Status  On-going      OT LONG TERM GOAL #3   Title  Patient will increase A/ROM of LUE to WNL to increase ability to complete tasks above shoulder level as needed.     Time  8    Period  Weeks    Status  On-going      OT LONG TERM GOAL #4   Title  Patient will increase LUE strength to 4+/5 in order to be able to return to work and complete required tasks with less difficulty.     Time  8    Period  Weeks    Status  On-going      OT LONG TERM GOAL #5   Status  On-going            Plan - 07/11/17 1149    Clinical Impression Statement  A: Progressed to AA/ROM this session while adding wall wash, proximal shoulder strengthening, and wall wash. Patient required education regarding AA/ROM restrictions and what he's allowed to do at home and during  his daily tasks. HEP was updated with education provided for proper form and technique.     Plan  P: Follow up on HEP. Add rhythmic stabilization supine.     Consulted and Agree with Plan of Care  Patient       Patient will benefit from skilled therapeutic intervention in order to improve the following deficits and impairments:  Pain, Impaired UE functional use, Increased fascial restrictions, Decreased range of motion  Visit Diagnosis: Other symptoms and signs involving the musculoskeletal  system  Acute pain of left shoulder  Stiffness of left shoulder, not elsewhere classified    Problem List Patient Active Problem List   Diagnosis Date Noted  . S/P rotator cuff repair 02/10/2014  . Pain in joint, shoulder region 02/10/2014  . Muscle weakness (generalized) 02/10/2014  . Decreased range of motion of right shoulder 02/10/2014   Ailene Ravel, OTR/L,CBIS  [phone]  07/11/2017, 12:10 PM  [place], [place], [address] Phone: [phone]   Fax:  [phone]  Name: Marcus Chen MRN: [id number] Date of Birth: 02-14-1966

## 2017-07-13 ENCOUNTER — Encounter (HOSPITAL_COMMUNITY): Payer: Self-pay | Admitting: Occupational Therapy

## 2017-07-13 ENCOUNTER — Ambulatory Visit (HOSPITAL_COMMUNITY): Payer: PRIVATE HEALTH INSURANCE | Admitting: Occupational Therapy

## 2017-07-13 DIAGNOSIS — M25612 Stiffness of left shoulder, not elsewhere classified: Secondary | ICD-10-CM

## 2017-07-13 DIAGNOSIS — R29898 Other symptoms and signs involving the musculoskeletal system: Secondary | ICD-10-CM

## 2017-07-13 DIAGNOSIS — M25512 Pain in left shoulder: Secondary | ICD-10-CM

## 2017-07-13 NOTE — Therapy (Signed)
[place], [place], [address] Phone: [phone]   Fax:  [phone]  Occupational Therapy Treatment  Patient Details  Name: Marcus Chen MRN: [id number] Date of Birth: August 21, 1965 Referring Provider: Vickey Huger MD   Encounter Date: 07/13/2017  OT End of Session - 07/13/17 1113    Visit Number  8    Number of Visits  16    Date for OT Re-Evaluation  08/20/17 mini reassess on 07/23/17    Authorization Type  [employer]    OT Start Time  1030    OT Stop Time  1110    OT Time Calculation (min)  40 min    Activity Tolerance  Patient tolerated treatment well    Behavior During Therapy  [facility] for tasks assessed/performed       Past Medical History:  Diagnosis Date  . AC (acromioclavicular) joint bone spurs    spurs  . Allergy   . Asthma   . Gout   . Kidney stone   . Post-operative nausea and vomiting   . Shoulder pain, bilateral     Past Surgical History:  Procedure Laterality Date  . APPENDECTOMY    . ROTATOR CUFF REPAIR  2014   right/ left shoulder was done 2010  . stomach reduction  2003  . TONSILLECTOMY      There were no vitals filed for this visit.  Subjective Assessment - 07/13/17 1031    Subjective   S: I mainly just have tightness.     Currently in Pain?  No/denies         [facility] OT Assessment - 07/13/17 1030      Assessment   Medical Diagnosis  left RTC repair, SAD, SA      Precautions   Precautions  Shoulder    Type of Shoulder Precautions  Standard RTC repair protocol. P/ROM for 4 weeks. Begin AA/ROM on 07/11/17. Begin A/ROM on 08/08/17                 OT Treatments/Exercises (OP) - 07/13/17 1031      Exercises   Exercises  Shoulder      Shoulder Exercises: Supine   Protraction  PROM;10 reps;AAROM;12 reps    Horizontal ABduction  PROM;5 reps;AAROM;12 reps    External Rotation  PROM;5 reps;AAROM;12 reps    Internal Rotation  PROM;5 reps;AAROM;12 reps    Flexion  PROM;5  reps;AAROM;12 reps    ABduction  PROM;5 reps;AAROM;10 reps    Other Supine Exercises  Serratus Anterior Punch; 15X      Shoulder Exercises: Standing   Protraction  AAROM;10 reps    Horizontal ABduction  AAROM;10 reps    External Rotation  AAROM;10 reps    Internal Rotation  AAROM;10 reps    Flexion  AAROM;10 reps    ABduction  AAROM;10 reps      Shoulder Exercises: Pulleys   Flexion  1 minute    ABduction  1 minute      Shoulder Exercises: ROM/Strengthening   Wall Wash  1'    Thumb Tacks  1'    Proximal Shoulder Strengthening, Supine  10X    Proximal Shoulder Strengthening, Seated  10X    Rhythmic Stabilization, Supine  30" each-arm at 45, 90, and 120 degrees, min/mod difficulty      Manual Therapy   Manual Therapy  Myofascial release    Manual therapy comments  Manual therapy completed prior to exercises.    Myofascial Release  Myofascial release and manual stretching completed to left upper arm, trapezius, and scapularis region to decrease fascial restrictions and increase joint mobility in a pain free zone.                OT Short Term Goals - 07/09/17 1125      OT SHORT TERM GOAL #1   Title  Patient will be educated and independent with HEP to increase functional performance during daily tasks using LUE.    Time  4    Period  Weeks    Status  On-going      OT SHORT TERM GOAL #2   Title  patient will increase A/ROM to [facility] to increase ability to get dressed with less difficulty.    Time  4    Period  Weeks    Status  On-going      OT SHORT TERM GOAL #3   Title  Patient will decrease fascial restrictions to trace amount or less to increase functional mobility in LUE needed for daily tasks.     Time  4    Period  Weeks    Status  On-going      OT SHORT TERM GOAL #4   Title  Patient will increase LUE strength to 3+/5 to increase ability to complete tasks at shoulder height with less difficulty.     Time  4    Period  Weeks    Status  On-going      OT  SHORT TERM GOAL #5   Title  Patient will decrease pain level with shoulder movements and daily tasks to 4/10 or less.     Time  4    Period  Weeks    Status  On-going        OT Long Term Goals - 06/27/17 1025      OT LONG TERM GOAL #1   Title  Patient will return to highest level of independence with all daily tasks while able to return to work using his LUE as his non-dominant.     Time  8    Period  Weeks    Status  On-going      OT LONG TERM GOAL #2   Title  Patient will report a decreased pain level during daily and work related tasks of 2/10 or less.     Time  8    Period  Weeks    Status  On-going      OT LONG TERM GOAL #3   Title  Patient will increase A/ROM of LUE to WNL to increase ability to complete tasks above shoulder level as needed.     Time  8    Period  Weeks    Status  On-going      OT LONG TERM GOAL #4   Title  Patient will increase LUE strength to 4+/5 in order to be able to return to work and complete required tasks with less difficulty.     Time  8    Period  Weeks    Status  On-going      OT LONG TERM GOAL #5   Status  On-going            Plan - 07/13/17 1113    Clinical Impression Statement  A: Pt reports HEP is going well. Continued with AA/ROM, adding AA/ROM IR/er in standing with elbow positioned against wall for end range stretch. Added supine rthymic stabilization exercises, min/mod difficulty. Verbal cuing intermittently during  session for form and technique. Occasional rest breaks for fatigue.     Plan  P: Continue with AA/ROM increasing repetitions as able to tolerate       Patient will benefit from skilled therapeutic intervention in order to improve the following deficits and impairments:  Pain, Impaired UE functional use, Increased fascial restrictions, Decreased range of motion  Visit Diagnosis: Other symptoms and signs involving the musculoskeletal system  Acute pain of left shoulder  Stiffness of left shoulder, not  elsewhere classified    Problem List Patient Active Problem List   Diagnosis Date Noted  . S/P rotator cuff repair 02/10/2014  . Pain in joint, shoulder region 02/10/2014  . Muscle weakness (generalized) 02/10/2014  . Decreased range of motion of right shoulder 02/10/2014   [name], OTR/L  [phone] 07/13/2017, 11:15 AM  [place], [place], [address] Phone: [phone]   Fax:  [phone]  Name: CORYDON SCHWEISS MRN: [id number] Date of Birth: [birth year]

## 2017-07-16 ENCOUNTER — Ambulatory Visit (HOSPITAL_COMMUNITY): Payer: PRIVATE HEALTH INSURANCE

## 2017-07-16 ENCOUNTER — Encounter (HOSPITAL_COMMUNITY): Payer: Self-pay

## 2017-07-16 ENCOUNTER — Other Ambulatory Visit: Payer: Self-pay

## 2017-07-16 DIAGNOSIS — R29898 Other symptoms and signs involving the musculoskeletal system: Secondary | ICD-10-CM

## 2017-07-16 DIAGNOSIS — M25512 Pain in left shoulder: Secondary | ICD-10-CM

## 2017-07-16 DIAGNOSIS — M25612 Stiffness of left shoulder, not elsewhere classified: Secondary | ICD-10-CM

## 2017-07-16 NOTE — Therapy (Signed)
[place], [place], [address] Phone: [phone]   Fax:  [phone]  Occupational Therapy Treatment  Patient Details  Name: Marcus Chen MRN: [id number] Date of Birth: 01-25-1966 Referring Provider: Vickey Huger MD   Encounter Date: 07/16/2017  OT End of Session - 07/16/17 1101    Visit Number  9    Number of Visits  16    Date for OT Re-Evaluation  08/20/17 mini reassess on 07/23/17    Authorization Type  [employer]    OT Start Time  1035    OT Stop Time  1111    OT Time Calculation (min)  36 min    Activity Tolerance  Patient tolerated treatment well    Behavior During Therapy  [facility] for tasks assessed/performed       Past Medical History:  Diagnosis Date  . AC (acromioclavicular) joint bone spurs    spurs  . Allergy   . Asthma   . Gout   . Kidney stone   . Post-operative nausea and vomiting   . Shoulder pain, bilateral     Past Surgical History:  Procedure Laterality Date  . APPENDECTOMY    . ROTATOR CUFF REPAIR  2014   right/ left shoulder was done 2010  . stomach reduction  2003  . TONSILLECTOMY      There were no vitals filed for this visit.  Subjective Assessment - 07/16/17 1055    Subjective   S: Saturday I was just sore.     Currently in Pain?  Yes    Pain Score  1     Pain Location  Shoulder    Pain Orientation  Right    Pain Descriptors / Indicators  Aching    Pain Type  Acute pain    Pain Radiating Towards  N/A    Pain Onset  In the past 7 days    Pain Frequency  Constant    Aggravating Factors   Did not take any pain medication    Pain Relieving Factors  ice, pain medication    Effect of Pain on Daily Activities  min effect    Multiple Pain Sites  No         [facility] OT Assessment - 07/16/17 1054      Assessment   Medical Diagnosis  left RTC repair, SAD, SA      Precautions   Precautions  Shoulder    Type of Shoulder Precautions  Standard RTC repair protocol. P/ROM for 4  weeks. Begin AA/ROM on 07/11/17. Begin A/ROM on 08/08/17                 OT Treatments/Exercises (OP) - 07/16/17 1054      Exercises   Exercises  Shoulder      Shoulder Exercises: Supine   Protraction  PROM;10 reps;AAROM;12 reps    Horizontal ABduction  PROM;5 reps;AAROM;12 reps    External Rotation  PROM;5 reps;AAROM;12 reps    Internal Rotation  PROM;5 reps;AAROM;12 reps    Flexion  PROM;5 reps;AAROM;12 reps    ABduction  PROM;5 reps;AAROM;10 reps      Shoulder Exercises: Standing   Protraction  AAROM;12 reps    Horizontal ABduction  AAROM;12 reps    External Rotation  AAROM;12 reps    Internal Rotation  AAROM;12 reps    Flexion  AAROM;12 reps    ABduction  AAROM;12 reps      Shoulder Exercises: Pulleys   Flexion  1 minute    ABduction  1 minute      Shoulder Exercises: ROM/Strengthening   Wall Wash  1'    Proximal Shoulder Strengthening, Supine  12X    Proximal Shoulder Strengthening, Seated  12X one rest break      Shoulder Exercises: Stretch   External Rotation Stretch  2 reps;30 seconds using PVC pipe      Manual Therapy   Manual Therapy  Myofascial release    Manual therapy comments  Manual therapy completed prior to exercises.    Myofascial Release  Myofascial release and manual stretching completed to left upper arm, trapezius, and scapularis region to decrease fascial restrictions and increase joint mobility in a pain free zone.              OT Education - 07/16/17 1108    Education provided  Yes    Education Details  self trigger point release with ball. External rotation stretching with long PVC pipe. Pectoralis stretch using doorway (elbow at 90 degrees)    Person(s) Educated  Patient    Methods  Explanation;Demonstration    Comprehension  Verbalized understanding;Returned demonstration       OT Short Term Goals - 07/09/17 1125      OT SHORT TERM GOAL #1   Title  Patient will be educated and independent with HEP to increase functional  performance during daily tasks using LUE.    Time  4    Period  Weeks    Status  On-going      OT SHORT TERM GOAL #2   Title  patient will increase A/ROM to [facility] to increase ability to get dressed with less difficulty.    Time  4    Period  Weeks    Status  On-going      OT SHORT TERM GOAL #3   Title  Patient will decrease fascial restrictions to trace amount or less to increase functional mobility in LUE needed for daily tasks.     Time  4    Period  Weeks    Status  On-going      OT SHORT TERM GOAL #4   Title  Patient will increase LUE strength to 3+/5 to increase ability to complete tasks at shoulder height with less difficulty.     Time  4    Period  Weeks    Status  On-going      OT SHORT TERM GOAL #5   Title  Patient will decrease pain level with shoulder movements and daily tasks to 4/10 or less.     Time  4    Period  Weeks    Status  On-going        OT Long Term Goals - 06/27/17 1025      OT LONG TERM GOAL #1   Title  Patient will return to highest level of independence with all daily tasks while able to return to work using his LUE as his non-dominant.     Time  8    Period  Weeks    Status  On-going      OT LONG TERM GOAL #2   Title  Patient will report a decreased pain level during daily and work related tasks of 2/10 or less.     Time  8    Period  Weeks    Status  On-going      OT LONG TERM GOAL #3   Title  Patient will increase A/ROM of  LUE to WNL to increase ability to complete tasks above shoulder level as needed.     Time  8    Period  Weeks    Status  On-going      OT LONG TERM GOAL #4   Title  Patient will increase LUE strength to 4+/5 in order to be able to return to work and complete required tasks with less difficulty.     Time  8    Period  Weeks    Status  On-going      OT LONG TERM GOAL #5   Status  On-going            Plan - 07/16/17 1114    Clinical Impression Statement  A: pt was educated on self trigger point release  technique using ball. Added additional education regarding external rotation and pectoralis muscle stretches. VC for form and technique during session as needed.     Plan  P: Follow up on pain/soreness in anterior portion of shoulder. Continue with AA/ROM.        Patient will benefit from skilled therapeutic intervention in order to improve the following deficits and impairments:  Pain, Impaired UE functional use, Increased fascial restrictions, Decreased range of motion  Visit Diagnosis: Other symptoms and signs involving the musculoskeletal system  Acute pain of left shoulder  Stiffness of left shoulder, not elsewhere classified    Problem List Patient Active Problem List   Diagnosis Date Noted  . S/P rotator cuff repair 02/10/2014  . Pain in joint, shoulder region 02/10/2014  . Muscle weakness (generalized) 02/10/2014  . Decreased range of motion of right shoulder 02/10/2014   Ailene Ravel, OTR/L,CBIS  [phone]  07/16/2017, 11:41 AM  [place], [place], [address] Phone: [phone]   Fax:  [phone]  Name: Marcus Chen MRN: [id number] Date of Birth: 06/02/1965

## 2017-07-18 ENCOUNTER — Encounter (HOSPITAL_COMMUNITY): Payer: Self-pay

## 2017-07-18 ENCOUNTER — Other Ambulatory Visit: Payer: Self-pay

## 2017-07-18 ENCOUNTER — Ambulatory Visit (HOSPITAL_COMMUNITY): Payer: PRIVATE HEALTH INSURANCE

## 2017-07-18 DIAGNOSIS — R29898 Other symptoms and signs involving the musculoskeletal system: Secondary | ICD-10-CM

## 2017-07-18 DIAGNOSIS — M25612 Stiffness of left shoulder, not elsewhere classified: Secondary | ICD-10-CM

## 2017-07-18 DIAGNOSIS — M25512 Pain in left shoulder: Secondary | ICD-10-CM

## 2017-07-18 NOTE — Therapy (Signed)
[place], [place], [address] Phone: [phone]   Fax:  [phone]  Occupational Therapy Treatment  Patient Details  Name: Marcus Chen MRN: [id number] Date of Birth: Oct 23, 1965 Referring Provider: Vickey Huger MD   Encounter Date: 07/18/2017  OT End of Session - 07/18/17 1147    Visit Number  10    Number of Visits  16    Date for OT Re-Evaluation  08/20/17 mini reassess on 07/23/17    Authorization Type  [employer]    OT Start Time  1035    OT Stop Time  1115    OT Time Calculation (min)  40 min    Activity Tolerance  Patient tolerated treatment well    Behavior During Therapy  [facility] for tasks assessed/performed       Past Medical History:  Diagnosis Date  . AC (acromioclavicular) joint bone spurs    spurs  . Allergy   . Asthma   . Gout   . Kidney stone   . Post-operative nausea and vomiting   . Shoulder pain, bilateral     Past Surgical History:  Procedure Laterality Date  . APPENDECTOMY    . ROTATOR CUFF REPAIR  2014   right/ left shoulder was done 2010  . stomach reduction  2003  . TONSILLECTOMY      There were no vitals filed for this visit.  Subjective Assessment - 07/18/17 1144    Subjective   S: It's still sore in that one spot.    Currently in Pain?  Yes    Pain Score  1     Pain Location  Shoulder    Pain Orientation  Right    Pain Descriptors / Indicators  Aching         [facility] OT Assessment - 07/18/17 1145      Assessment   Medical Diagnosis  left RTC repair, SAD, SA      Precautions   Precautions  Shoulder    Type of Shoulder Precautions  Standard RTC repair protocol. P/ROM for 4 weeks. Begin AA/ROM on 07/11/17. Begin A/ROM on 08/08/17                 OT Treatments/Exercises (OP) - 07/18/17 0001      Exercises   Exercises  Shoulder      Shoulder Exercises: Supine   Protraction  PROM;5 reps;AAROM;15 reps    Horizontal ABduction  PROM;5 reps;AAROM;15 reps    External Rotation  PROM;5 reps;AAROM;15 reps    Internal Rotation  PROM;5 reps;AAROM;15 reps    Flexion  PROM;5 reps;AAROM;15 reps    ABduction  PROM;5 reps;AAROM;15 reps      Shoulder Exercises: Standing   Protraction  AAROM;12 reps    Horizontal ABduction  AAROM;12 reps    External Rotation  AAROM;12 reps    Internal Rotation  AAROM;12 reps    Flexion  AAROM;12 reps    ABduction  AAROM;12 reps      Shoulder Exercises: Pulleys   Flexion  1 minute    ABduction  1 minute      Shoulder Exercises: ROM/Strengthening   Wall Wash  1'    Proximal Shoulder Strengthening, Supine  15X    Proximal Shoulder Strengthening, Seated  15X       Shoulder Exercises: Stretch   Other Shoulder Stretches  Pectoralis stretch using wall; elbow bent at 90 degrees 2 sets 30 seconds        Manual  Therapy   Manual Therapy  --    Manual therapy comments  Manual therapy completed prior to exercises.    Joint Mobilization  Inferior glenohumeral joint/capsule mobilization and stretch. Posterior glenohumeral joint mobilization and capsular stretch    Myofascial Release  Myofascial release and manual stretching completed to left upper arm, trapezius, and scapularis region to decrease fascial restrictions and increase joint mobility in a pain free zone.                OT Short Term Goals - 07/09/17 1125      OT SHORT TERM GOAL #1   Title  Patient will be educated and independent with HEP to increase functional performance during daily tasks using LUE.    Time  4    Period  Weeks    Status  On-going      OT SHORT TERM GOAL #2   Title  patient will increase A/ROM to [facility] to increase ability to get dressed with less difficulty.    Time  4    Period  Weeks    Status  On-going      OT SHORT TERM GOAL #3   Title  Patient will decrease fascial restrictions to trace amount or less to increase functional mobility in LUE needed for daily tasks.     Time  4    Period  Weeks    Status  On-going      OT  SHORT TERM GOAL #4   Title  Patient will increase LUE strength to 3+/5 to increase ability to complete tasks at shoulder height with less difficulty.     Time  4    Period  Weeks    Status  On-going      OT SHORT TERM GOAL #5   Title  Patient will decrease pain level with shoulder movements and daily tasks to 4/10 or less.     Time  4    Period  Weeks    Status  On-going        OT Long Term Goals - 06/27/17 1025      OT LONG TERM GOAL #1   Title  Patient will return to highest level of independence with all daily tasks while able to return to work using his LUE as his non-dominant.     Time  8    Period  Weeks    Status  On-going      OT LONG TERM GOAL #2   Title  Patient will report a decreased pain level during daily and work related tasks of 2/10 or less.     Time  8    Period  Weeks    Status  On-going      OT LONG TERM GOAL #3   Title  Patient will increase A/ROM of LUE to WNL to increase ability to complete tasks above shoulder level as needed.     Time  8    Period  Weeks    Status  On-going      OT LONG TERM GOAL #4   Title  Patient will increase LUE strength to 4+/5 in order to be able to return to work and complete required tasks with less difficulty.     Time  8    Period  Weeks    Status  On-going      OT LONG TERM GOAL #5   Status  On-going            Plan -  07/18/17 1053    Clinical Impression Statement  A: Pt with continued soreness and trigger point in left anterior deltoid region. Joint mobilization techniques completed to help decrease discomfort. Patient did find relief with joint distraction when completing abduction passively. VC for form and technique specifically with speed of movement.    Plan  P: Continue with AA/ROM. Follow on pain/discomfort in anterior portion of shoulder and address with joint mobilization as needed.     Consulted and Agree with Plan of Care  Patient       Patient will benefit from skilled therapeutic intervention  in order to improve the following deficits and impairments:  Pain, Impaired UE functional use, Increased fascial restrictions, Decreased range of motion  Visit Diagnosis: Other symptoms and signs involving the musculoskeletal system  Acute pain of left shoulder  Stiffness of left shoulder, not elsewhere classified    Problem List Patient Active Problem List   Diagnosis Date Noted  . S/P rotator cuff repair 02/10/2014  . Pain in joint, shoulder region 02/10/2014  . Muscle weakness (generalized) 02/10/2014  . Decreased range of motion of right shoulder 02/10/2014   Ailene Ravel, OTR/L,CBIS  [phone]  07/18/2017, 11:50 AM  [place], [place], [address] Phone: [phone]   Fax:  [phone]  Name: Marcus Chen MRN: [id number] Date of Birth: 1965-07-06

## 2017-07-20 ENCOUNTER — Other Ambulatory Visit: Payer: Self-pay

## 2017-07-20 ENCOUNTER — Encounter (HOSPITAL_COMMUNITY): Payer: Self-pay

## 2017-07-20 ENCOUNTER — Ambulatory Visit (HOSPITAL_COMMUNITY): Payer: PRIVATE HEALTH INSURANCE

## 2017-07-20 DIAGNOSIS — R29898 Other symptoms and signs involving the musculoskeletal system: Secondary | ICD-10-CM | POA: Diagnosis not present

## 2017-07-20 DIAGNOSIS — M25512 Pain in left shoulder: Secondary | ICD-10-CM

## 2017-07-20 DIAGNOSIS — M25612 Stiffness of left shoulder, not elsewhere classified: Secondary | ICD-10-CM

## 2017-07-20 NOTE — Therapy (Signed)
[place], [place], [address] Phone: [phone]   Fax:  [phone]  Occupational Therapy Treatment  Patient Details  Name: Marcus Chen MRN: [id number] Date of Birth: 1965-12-12 Referring Provider: Vickey Huger MD   Encounter Date: 07/20/2017  OT End of Session - 07/20/17 1203    Visit Number  11    Number of Visits  16    Date for OT Re-Evaluation  08/20/17 mini reassess on 07/23/17    Authorization Type  [employer]    OT Start Time  1033    OT Stop Time  1115    OT Time Calculation (min)  42 min    Activity Tolerance  Patient tolerated treatment well    Behavior During Therapy  [facility] for tasks assessed/performed       Past Medical History:  Diagnosis Date  . AC (acromioclavicular) joint bone spurs    spurs  . Allergy   . Asthma   . Gout   . Kidney stone   . Post-operative nausea and vomiting   . Shoulder pain, bilateral     Past Surgical History:  Procedure Laterality Date  . APPENDECTOMY    . ROTATOR CUFF REPAIR  2014   right/ left shoulder was done 2010  . stomach reduction  2003  . TONSILLECTOMY      There were no vitals filed for this visit.  Subjective Assessment - 07/20/17 1055    Subjective   S: The doctor said last week that I have a 20# weightlifting restriction and I can't lift that above my head. I'm going back to work on Monday.    Currently in Pain?  No/denies         [facility] OT Assessment - 07/20/17 1058      Assessment   Medical Diagnosis  left RTC repair, SAD, SA      Precautions   Precautions  Shoulder    Type of Shoulder Precautions  Pt reports today that last MD visit he was placed on a 20# weight lifting rerstriction with no lifting overhead.               OT Treatments/Exercises (OP) - 07/20/17 1056      Exercises   Exercises  Shoulder      Shoulder Exercises: Supine   Protraction  PROM;5 reps;AROM;12 reps    Horizontal ABduction  PROM;5 reps;AROM;12  reps    External Rotation  PROM;5 reps;AROM;12 reps    Internal Rotation  PROM;5 reps;AROM;12 reps    Flexion  PROM;5 reps;AROM;12 reps    ABduction  PROM;5 reps;AROM;12 reps      Shoulder Exercises: Standing   Protraction  AROM;Theraband;12 reps    Theraband Level (Shoulder Protraction)  Level 2 (Red)    Horizontal ABduction  AROM;12 reps    External Rotation  AROM;12 reps;Theraband;10 reps    Theraband Level (Shoulder External Rotation)  Level 2 (Red)    Internal Rotation  AROM;12 reps    Flexion  AROM;Theraband;12 reps    Theraband Level (Shoulder Flexion)  Level 2 (Red)    ABduction  AROM;12 reps    Extension  Theraband;12 reps    Theraband Level (Shoulder Extension)  Level 2 (Red)    Row  Theraband;12 reps    Theraband Level (Shoulder Row)  Level 2 (Red)    Retraction  Theraband;12 reps    Theraband Level (Shoulder Retraction)  Level 2 (Red)      Shoulder Exercises: ROM/Strengthening  X to V Arms  10X    Proximal Shoulder Strengthening, Supine  15X      Manual Therapy   Manual Therapy  Myofascial release    Manual therapy comments  Manual therapy completed prior to exercises.    Myofascial Release  Myofascial release and manual stretching completed to left upper arm, trapezius, and scapularis region to decrease fascial restrictions and increase joint mobility in a pain free zone.              OT Education - 07/20/17 1203    Education provided  Yes    Education Details  A/ROM and red band scapular strengthening    Person(s) Educated  Patient    Methods  Explanation;Demonstration;Handout;Tactile cues;Verbal cues    Comprehension  Returned demonstration;Verbalized understanding       OT Short Term Goals - 07/09/17 1125      OT SHORT TERM GOAL #1   Title  Patient will be educated and independent with HEP to increase functional performance during daily tasks using LUE.    Time  4    Period  Weeks    Status  On-going      OT SHORT TERM GOAL #2   Title   patient will increase A/ROM to [facility] to increase ability to get dressed with less difficulty.    Time  4    Period  Weeks    Status  On-going      OT SHORT TERM GOAL #3   Title  Patient will decrease fascial restrictions to trace amount or less to increase functional mobility in LUE needed for daily tasks.     Time  4    Period  Weeks    Status  On-going      OT SHORT TERM GOAL #4   Title  Patient will increase LUE strength to 3+/5 to increase ability to complete tasks at shoulder height with less difficulty.     Time  4    Period  Weeks    Status  On-going      OT SHORT TERM GOAL #5   Title  Patient will decrease pain level with shoulder movements and daily tasks to 4/10 or less.     Time  4    Period  Weeks    Status  On-going        OT Long Term Goals - 06/27/17 1025      OT LONG TERM GOAL #1   Title  Patient will return to highest level of independence with all daily tasks while able to return to work using his LUE as his non-dominant.     Time  8    Period  Weeks    Status  On-going      OT LONG TERM GOAL #2   Title  Patient will report a decreased pain level during daily and work related tasks of 2/10 or less.     Time  8    Period  Weeks    Status  On-going      OT LONG TERM GOAL #3   Title  Patient will increase A/ROM of LUE to WNL to increase ability to complete tasks above shoulder level as needed.     Time  8    Period  Weeks    Status  On-going      OT LONG TERM GOAL #4   Title  Patient will increase LUE strength to 4+/5 in order to be able to return to  work and complete required tasks with less difficulty.     Time  8    Period  Weeks    Status  On-going      OT LONG TERM GOAL #5   Status  On-going            Plan - 07/20/17 1203    Clinical Impression Statement  A: Progressed patient to A/ROM as he reports he received the ok from the MD to lift no more than 20# over head. He is returning to work Monday. Pt states that he was sore after last  session's mobilization techniques although today his ROM is less painful and full range. patient verbalizes improvement.     Plan  P: Mini reassessment. FOTO. Follow up on HEP. Continue with A/ROM. Add overhead lacing and ball on the wall.    Consulted and Agree with Plan of Care  Patient       Patient will benefit from skilled therapeutic intervention in order to improve the following deficits and impairments:  Pain, Impaired UE functional use, Increased fascial restrictions, Decreased range of motion  Visit Diagnosis: Other symptoms and signs involving the musculoskeletal system  Acute pain of left shoulder  Stiffness of left shoulder, not elsewhere classified    Problem List Patient Active Problem List   Diagnosis Date Noted  . S/P rotator cuff repair 02/10/2014  . Pain in joint, shoulder region 02/10/2014  . Muscle weakness (generalized) 02/10/2014  . Decreased range of motion of right shoulder 02/10/2014   Ailene Ravel, OTR/L,CBIS  [phone]  07/20/2017, 12:06 PM  [place] [address] [place], [place], [address] Phone: [phone]   Fax:  [phone]  Name: Marcus Chen MRN: [id number] Date of Birth: [birth year]

## 2017-07-20 NOTE — Patient Instructions (Signed)
Repeat all exercises 10-15 times, 1-2 times per day.  1) Shoulder Protraction    Begin with elbows by your side, slowly "punch" straight out in front of you.      2) Shoulder Flexion  Supine:     Standing:         Begin with arms at your side with thumbs pointed up, slowly raise both arms up and forward towards overhead.               3) Horizontal abduction/adduction  Supine:   Standing:           Begin with arms straight out in front of you, bring out to the side in at "T" shape. Keep arms straight entire time.                 4) Internal & External Rotation    *No band* -Stand with elbows at the side and elbows bent 90 degrees. Move your forearms away from your body, then bring back inward toward the body.     5) Shoulder Abduction  Supine:     Standing:       Lying on your back begin with your arms flat on the table next to your side. Slowly move your arms out to the side so that they go overhead, in a jumping jack or snow angel movement.    6) X to V arms (cheerleader move):  Begin with arms straight down, crossed in front of body in an "X". Keeping arms crossed, lift arms straight up overhead. Then spread arms apart into a "V" shape.  Bring back together into x and lower down to starting position.   (Home) Extension: Isometric / Bilateral Arm Retraction - Sitting   Facing anchor, hold hands and elbow at shoulder height, with elbow bent.  Pull arms back to squeeze shoulder blades together. Repeat 10-15 times. 1-3 times/day.   (Clinic) Extension / Flexion (Assist)   Face anchor, pull arms back, keeping elbow straight, and squeze shoulder blades together. Repeat 10-15 times. 1-3 times/day.   Copyright  VHI. All rights reserved.   (Home) Retraction: Row - Bilateral (Anchor)   Facing anchor, arms reaching forward, pull hands toward stomach, keeping elbows bent and at your sides and pinching shoulder blades together. Repeat  10-15 times. 1-3 times/day.   Copyright  VHI. All rights reserved.   ELASTIC BAND SHOULDER EXTERNAL ROTATION - ER  While holding an elastic band at your side with your elbow bent, start with your hand near your stomach and then pull the band away. Keep your elbow at your side the entire time.     ELASTIC BAND SHOULDER FLEXION  While holding an elastic band at your side, draw up your arm up in front of you keeping your elbow straight.

## 2017-07-23 ENCOUNTER — Ambulatory Visit (HOSPITAL_COMMUNITY): Payer: PRIVATE HEALTH INSURANCE

## 2017-07-23 ENCOUNTER — Encounter (HOSPITAL_COMMUNITY): Payer: Self-pay

## 2017-07-23 ENCOUNTER — Other Ambulatory Visit: Payer: Self-pay

## 2017-07-23 DIAGNOSIS — M25512 Pain in left shoulder: Secondary | ICD-10-CM

## 2017-07-23 DIAGNOSIS — R29898 Other symptoms and signs involving the musculoskeletal system: Secondary | ICD-10-CM

## 2017-07-23 DIAGNOSIS — M25612 Stiffness of left shoulder, not elsewhere classified: Secondary | ICD-10-CM

## 2017-07-23 NOTE — Therapy (Signed)
[place], [place], [address] Phone: [phone]   Fax:  [phone]  Occupational Therapy Treatment And mini reassessment Patient Details  Name: COTTON BECKLEY MRN: [id number] Date of Birth: [birth year] Referring Provider: Vickey Huger MD   Encounter Date: 07/23/2017  OT End of Session - 07/23/17 1219    Visit Number  12    Number of Visits  16    Date for OT Re-Evaluation  08/20/17    Authorization Type  [employer]    OT Start Time  1033    OT Stop Time  1123    OT Time Calculation (min)  50 min    Activity Tolerance  Patient tolerated treatment well    Behavior During Therapy  [facility] for tasks assessed/performed       Past Medical History:  Diagnosis Date  . AC (acromioclavicular) joint bone spurs    spurs  . Allergy   . Asthma   . Gout   . Kidney stone   . Post-operative nausea and vomiting   . Shoulder pain, bilateral     Past Surgical History:  Procedure Laterality Date  . APPENDECTOMY    . ROTATOR CUFF REPAIR  2014   right/ left shoulder was done 2010  . stomach reduction  2003  . TONSILLECTOMY      There were no vitals filed for this visit.  Subjective Assessment - 07/23/17 1217    Subjective   S: Friday it was killing me and it's been sore in the front and the back of the shoulder.     Currently in Pain?  Yes    Pain Score  2     Pain Location  Shoulder    Pain Orientation  Right    Pain Descriptors / Indicators  Aching    Pain Type  Acute pain    Pain Radiating Towards  shoulder to bicep and tricep    Pain Onset  In the past 7 days    Pain Frequency  Constant    Aggravating Factors   new exercises    Pain Relieving Factors  heat, pain medication    Effect of Pain on Daily Activities  max effect    Multiple Pain Sites  No         [facility] OT Assessment - 07/23/17 1034      Assessment   Medical Diagnosis  left RTC repair, SAD, SA    Onset Date/Surgical Date  06/13/17      Precautions   Precautions  Shoulder    Type of Shoulder Precautions  Pt reports that last MD visit he was placed on a 20# weight lifting rerstriction with no lifting overhead.      Prior Function   Level of Independence  Independent      Observation/Other Assessments   Focus on Therapeutic Outcomes (FOTO)   43/100      ROM / Strength   AROM / PROM / Strength  AROM;Strength;PROM      AROM   Overall AROM Comments  Assessed seated. IR/er adducted    AROM Assessment Site  Shoulder    Right/Left Shoulder  Left    Left Shoulder Flexion  160 Degrees previous: 75    Left Shoulder ABduction  172 Degrees previous: 68    Left Shoulder Internal Rotation  90 Degrees previous: same    Left Shoulder External Rotation  75 Degrees previous: same      PROM   Overall  PROM Comments  Assessed supine. IR/er adducted.    PROM Assessment Site  Shoulder    Right/Left Shoulder  Left    Left Shoulder Flexion  180 Degrees previous: 147    Left Shoulder ABduction  180 Degrees previous: same    Left Shoulder Internal Rotation  90 Degrees previous: same    Left Shoulder External Rotation  90 Degrees previous: same      Strength   Overall Strength Comments  Assessed seated. IR/er adducted    Strength Assessment Site  Shoulder    Right/Left Shoulder  Left    Left Shoulder Flexion  4/5    Left Shoulder ABduction  3/5    Left Shoulder Internal Rotation  3+/5    Left Shoulder External Rotation  3/5               OT Treatments/Exercises (OP) - 07/23/17 1100      Exercises   Exercises  Shoulder      Shoulder Exercises: Supine   Protraction  PROM;5 reps;AROM;10 reps    Horizontal ABduction  PROM;5 reps;AROM;10 reps    External Rotation  PROM;5 reps;AROM;10 reps    Internal Rotation  PROM;5 reps;AROM;10 reps    Flexion  PROM;5 reps;AROM;10 reps    ABduction  PROM;5 reps      Shoulder Exercises: ROM/Strengthening   Proximal Shoulder Strengthening, Supine  15X      Shoulder Exercises: Stretch    External Rotation Stretch  2 reps;30 seconds wall       Manual Therapy   Manual Therapy  Myofascial release    Manual therapy comments  Manual therapy completed prior to exercises.    Myofascial Release  Myofascial release and manual stretching completed to left upper arm, trapezius, and scapularis region to decrease fascial restrictions and increase joint mobility in a pain free zone.                OT Short Term Goals - 07/23/17 1116      OT SHORT TERM GOAL #1   Title  Patient will be educated and independent with HEP to increase functional performance during daily tasks using LUE.    Time  4    Period  Weeks    Status  Achieved      OT SHORT TERM GOAL #2   Title  patient will increase A/ROM to [facility] to increase ability to get dressed with less difficulty.    Time  4    Period  Weeks    Status  Achieved      OT SHORT TERM GOAL #3   Title  Patient will decrease fascial restrictions to trace amount or less to increase functional mobility in LUE needed for daily tasks.     Time  4    Period  Weeks    Status  On-going      OT SHORT TERM GOAL #4   Title  Patient will increase LUE strength to 3+/5 to increase ability to complete tasks at shoulder height with less difficulty.     Time  4    Period  Weeks    Status  On-going      OT SHORT TERM GOAL #5   Title  Patient will decrease pain level with shoulder movements and daily tasks to 4/10 or less.     Time  4    Period  Weeks    Status  On-going        OT Long Term Goals -  07/23/17 1228      OT LONG TERM GOAL #1   Title  Patient will return to highest level of independence with all daily tasks while able to return to work using his LUE as his non-dominant.     Time  8    Period  Weeks    Status  On-going      OT LONG TERM GOAL #2   Title  Patient will report a decreased pain level during daily and work related tasks of 2/10 or less.     Time  8    Period  Weeks    Status  On-going      OT LONG TERM GOAL  #3   Title  Patient will increase A/ROM of LUE to WNL to increase ability to complete tasks above shoulder level as needed.     Time  8    Period  Weeks    Status  On-going      OT LONG TERM GOAL #4   Title  Patient will increase LUE strength to 4+/5 in order to be able to return to work and complete required tasks with less difficulty.     Time  8    Period  Weeks    Status  On-going            Plan - 07/23/17 1219    Clinical Impression Statement  A: Patient is back to work today. Reports that after Friday's session, he had increased discomfort and soreness. Through the weekend it continued although it was slightly less elevated. Today he reports a 2/10 pain. Trigger point palpated in his teres major and possibly lattissimus dorsi on the left lateral region. ES and moist heat used at end of session for pain management. Mini-reassessment completed this date. patient has met 2/5 STGs at this point. He is progressing well in therapy. He is experiencing muscle soreness and discomfort from the progression of therapy. Therapist will continue to monitor. Patient required VC for form and technique during session.      Plan  P: Follow up on pain and continue with A/ROM. Add overhead lacing and ball on the wall if able to tolerate.     Consulted and Agree with Plan of Care  Patient       Patient will benefit from skilled therapeutic intervention in order to improve the following deficits and impairments:  Pain, Impaired UE functional use, Increased fascial restrictions, Decreased range of motion  Visit Diagnosis: Other symptoms and signs involving the musculoskeletal system  Acute pain of left shoulder  Stiffness of left shoulder, not elsewhere classified    Problem List Patient Active Problem List   Diagnosis Date Noted  . S/P rotator cuff repair 02/10/2014  . Pain in joint, shoulder region 02/10/2014  . Muscle weakness (generalized) 02/10/2014  . Decreased range of motion of  right shoulder 02/10/2014   Ailene Ravel, OTR/L,CBIS  [phone]  07/23/2017, 12:29 PM  [place] [address] [place], [place], [address] Phone: [phone]   Fax:  [phone]  Name: KALLEN MCCRYSTAL MRN: [id number] Date of Birth: 05/28/1966

## 2017-07-25 ENCOUNTER — Ambulatory Visit (HOSPITAL_COMMUNITY): Payer: PRIVATE HEALTH INSURANCE

## 2017-07-25 ENCOUNTER — Encounter (HOSPITAL_COMMUNITY): Payer: Self-pay

## 2017-07-25 ENCOUNTER — Other Ambulatory Visit: Payer: Self-pay

## 2017-07-25 DIAGNOSIS — M25612 Stiffness of left shoulder, not elsewhere classified: Secondary | ICD-10-CM

## 2017-07-25 DIAGNOSIS — R29898 Other symptoms and signs involving the musculoskeletal system: Secondary | ICD-10-CM | POA: Diagnosis not present

## 2017-07-25 DIAGNOSIS — M25512 Pain in left shoulder: Secondary | ICD-10-CM

## 2017-07-25 NOTE — Therapy (Signed)
[place], [place], [address] Phone: [phone]   Fax:  [phone]  Occupational Therapy Treatment  Patient Details  Name: Marcus Chen MRN: [id number] Date of Birth: [birth year] Referring Provider: Vickey Huger MD   Encounter Date: 07/25/2017  OT End of Session - 07/25/17 1139    Visit Number  13    Number of Visits  16    Date for OT Re-Evaluation  08/20/17    Authorization Type  [employer]    OT Start Time  1026    OT Stop Time  1110    OT Time Calculation (min)  44 min    Activity Tolerance  Patient tolerated treatment well    Behavior During Therapy  [facility] for tasks assessed/performed       Past Medical History:  Diagnosis Date  . AC (acromioclavicular) joint bone spurs    spurs  . Allergy   . Asthma   . Gout   . Kidney stone   . Post-operative nausea and vomiting   . Shoulder pain, bilateral     Past Surgical History:  Procedure Laterality Date  . APPENDECTOMY    . ROTATOR CUFF REPAIR  2014   right/ left shoulder was done 2010  . stomach reduction  2003  . TONSILLECTOMY      There were no vitals filed for this visit.  Subjective Assessment - 07/25/17 1057    Subjective   S: It's a little bit better and I was able to do some exercises .    Currently in Pain?  Yes    Pain Score  1     Pain Location  Shoulder    Pain Orientation  Right    Pain Descriptors / Indicators  Aching    Pain Type  Acute pain         [facility] OT Assessment - 07/25/17 1100      Assessment   Medical Diagnosis  left RTC repair, SAD, SA      Precautions   Precautions  Shoulder    Type of Shoulder Precautions  Pt reports that last MD visit he was placed on a 20# weight lifting rerstriction with no lifting overhead.               OT Treatments/Exercises (OP) - 07/25/17 1100      Exercises   Exercises  Shoulder      Shoulder Exercises: Supine   Protraction  PROM;5 reps;AROM;15 reps    Horizontal  ABduction  PROM;5 reps;AROM;15 reps    External Rotation  PROM;5 reps;AROM;15 reps    Internal Rotation  PROM;5 reps;AROM;15 reps    Flexion  PROM;5 reps;AROM;15 reps    ABduction  PROM;5 reps;AROM;15 reps      Shoulder Exercises: Standing   External Rotation  Theraband;12 reps    Theraband Level (Shoulder External Rotation)  Level 2 (Red)    Flexion  Theraband;12 reps    Theraband Level (Shoulder Flexion)  Level 2 (Red)    Extension  Theraband;12 reps    Theraband Level (Shoulder Extension)  Level 2 (Red)    Row  Theraband;12 reps    Theraband Level (Shoulder Row)  Level 2 (Red)    Retraction  Theraband;12 reps    Theraband Level (Shoulder Retraction)  Level 2 (Red)      Shoulder Exercises: ROM/Strengthening   X to V Arms  10X    Proximal Shoulder Strengthening, Supine  15X    Diona Foley  on Wall  1' flexion 1' abduction green ball      Manual Therapy   Manual Therapy  Myofascial release    Manual therapy comments  Manual therapy completed prior to exercises.    Myofascial Release  Myofascial release and manual stretching completed to left upper arm, trapezius, and scapularis region to decrease fascial restrictions and increase joint mobility in a pain free zone.                OT Short Term Goals - 07/25/17 1146      OT SHORT TERM GOAL #1   Title  Patient will be educated and independent with HEP to increase functional performance during daily tasks using LUE.    Time  4    Period  Weeks      OT SHORT TERM GOAL #2   Title  patient will increase A/ROM to [facility] to increase ability to get dressed with less difficulty.    Time  4    Period  Weeks      OT SHORT TERM GOAL #3   Title  Patient will decrease fascial restrictions to trace amount or less to increase functional mobility in LUE needed for daily tasks.     Time  4    Period  Weeks    Status  On-going      OT SHORT TERM GOAL #4   Title  Patient will increase LUE strength to 3+/5 to increase ability to complete  tasks at shoulder height with less difficulty.     Time  4    Period  Weeks    Status  On-going      OT SHORT TERM GOAL #5   Title  Patient will decrease pain level with shoulder movements and daily tasks to 4/10 or less.     Time  4    Period  Weeks    Status  On-going        OT Long Term Goals - 07/23/17 1228      OT LONG TERM GOAL #1   Title  Patient will return to highest level of independence with all daily tasks while able to return to work using his LUE as his non-dominant.     Time  8    Period  Weeks    Status  On-going      OT LONG TERM GOAL #2   Title  Patient will report a decreased pain level during daily and work related tasks of 2/10 or less.     Time  8    Period  Weeks    Status  On-going      OT LONG TERM GOAL #3   Title  Patient will increase A/ROM of LUE to WNL to increase ability to complete tasks above shoulder level as needed.     Time  8    Period  Weeks    Status  On-going      OT LONG TERM GOAL #4   Title  Patient will increase LUE strength to 4+/5 in order to be able to return to work and complete required tasks with less difficulty.     Time  8    Period  Weeks    Status  On-going            Plan - 07/25/17 1140    Clinical Impression Statement  A: Pt reports that he doesn't feel as much discomfort as he did earlier this week. Was able to complete  A/ROM exercises and scapular theraband. Pain monitored during session. Therapist did encourage patient to skip external rotation strengthening with the band during session although patient was admitted about completing. VC for form and technique.    Plan  P: Continue with protraction with red band if pain is decreased.    Consulted and Agree with Plan of Care  Patient       Patient will benefit from skilled therapeutic intervention in order to improve the following deficits and impairments:  Pain, Impaired UE functional use, Increased fascial restrictions, Decreased range of motion  Visit  Diagnosis: Other symptoms and signs involving the musculoskeletal system  Acute pain of left shoulder  Stiffness of left shoulder, not elsewhere classified    Problem List Patient Active Problem List   Diagnosis Date Noted  . S/P rotator cuff repair 02/10/2014  . Pain in joint, shoulder region 02/10/2014  . Muscle weakness (generalized) 02/10/2014  . Decreased range of motion of right shoulder 02/10/2014   Ailene Ravel, OTR/L,CBIS  [phone]  07/25/2017, 11:48 AM  [place], [place], [address] Phone: [phone]   Fax:  [phone]  Name: Marcus Chen MRN: [id number] Date of Birth: [birth year]

## 2017-07-27 ENCOUNTER — Encounter (HOSPITAL_COMMUNITY): Payer: Self-pay

## 2017-07-27 ENCOUNTER — Ambulatory Visit (HOSPITAL_COMMUNITY): Payer: PRIVATE HEALTH INSURANCE | Attending: Orthopedic Surgery

## 2017-07-27 ENCOUNTER — Other Ambulatory Visit: Payer: Self-pay

## 2017-07-27 DIAGNOSIS — M25512 Pain in left shoulder: Secondary | ICD-10-CM | POA: Diagnosis present

## 2017-07-27 DIAGNOSIS — R29898 Other symptoms and signs involving the musculoskeletal system: Secondary | ICD-10-CM

## 2017-07-27 DIAGNOSIS — M25612 Stiffness of left shoulder, not elsewhere classified: Secondary | ICD-10-CM

## 2017-07-27 NOTE — Therapy (Signed)
[place], [place], [address] Phone: [phone]   Fax:  [phone]  Occupational Therapy Treatment  Patient Details  Name: Marcus Chen MRN: [id number] Date of Birth: 02-19-1966 Referring Provider: Vickey Huger MD   Encounter Date: 07/27/2017  OT End of Session - 07/27/17 1058    Visit Number  14    Number of Visits  16    Date for OT Re-Evaluation  08/20/17    Authorization Type  [employer]    OT Start Time  1035    OT Stop Time  1116    OT Time Calculation (min)  41 min    Activity Tolerance  Patient tolerated treatment well    Behavior During Therapy  [facility] for tasks assessed/performed       Past Medical History:  Diagnosis Date  . AC (acromioclavicular) joint bone spurs    spurs  . Allergy   . Asthma   . Gout   . Kidney stone   . Post-operative nausea and vomiting   . Shoulder pain, bilateral     Past Surgical History:  Procedure Laterality Date  . APPENDECTOMY    . ROTATOR CUFF REPAIR  2014   right/ left shoulder was done 2010  . stomach reduction  2003  . TONSILLECTOMY      There were no vitals filed for this visit.  Subjective Assessment - 07/27/17 1055    Subjective   S: It feels much better. Like where it was before Monday.    Currently in Pain?  No/denies    Pain Score  0-No pain         [facility] OT Assessment - 07/27/17 1057      Assessment   Medical Diagnosis  left RTC repair, SAD, SA      Precautions   Precautions  Shoulder    Type of Shoulder Precautions  Pt reports that last MD visit he was placed on a 20# weight lifting rerstriction with no lifting overhead.               OT Treatments/Exercises (OP) - 07/27/17 1057      Exercises   Exercises  Shoulder      Shoulder Exercises: Supine   Protraction  PROM;5 reps;AROM;15 reps    Horizontal ABduction  PROM;5 reps;AROM;15 reps    External Rotation  PROM;5 reps;AROM;15 reps    Internal Rotation  PROM;5  reps;AROM;15 reps    Flexion  PROM;5 reps;AROM;15 reps    ABduction  PROM;5 reps;AROM;15 reps      Shoulder Exercises: Sidelying   External Rotation  AROM;15 reps    Internal Rotation  AROM;15 reps    Flexion  AROM;15 reps    ABduction  AROM;15 reps    Other Sidelying Exercises  Protraction; A/ROM; 15X    Other Sidelying Exercises  Horizontal abduction; A/ROM; 15X      Shoulder Exercises: Standing   Protraction  Theraband 9X    Protraction Limitations  Verbal and physical cues for form/technique    Extension  Theraband;12 reps    Theraband Level (Shoulder Extension)  Level 2 (Red)    Row  Theraband;12 reps    Theraband Level (Shoulder Row)  Level 2 (Red)    Retraction  Theraband;12 reps    Theraband Level (Shoulder Retraction)  Level 2 (Red)      Shoulder Exercises: ROM/Strengthening   UBE (Upper Arm Bike)  Level 1 2' forward 2' reverse  Speed: 3.0-3.5 pace  Proximal Shoulder Strengthening, Supine  15X      Manual Therapy   Manual Therapy  Myofascial release    Manual therapy comments  Manual therapy completed prior to exercises.    Myofascial Release  Myofascial release and manual stretching completed to left upper arm, trapezius, and scapularis region to decrease fascial restrictions and increase joint mobility in a pain free zone.                OT Short Term Goals - 07/25/17 1146      OT SHORT TERM GOAL #1   Title  Patient will be educated and independent with HEP to increase functional performance during daily tasks using LUE.    Time  4    Period  Weeks      OT SHORT TERM GOAL #2   Title  patient will increase A/ROM to [facility] to increase ability to get dressed with less difficulty.    Time  4    Period  Weeks      OT SHORT TERM GOAL #3   Title  Patient will decrease fascial restrictions to trace amount or less to increase functional mobility in LUE needed for daily tasks.     Time  4    Period  Weeks    Status  On-going      OT SHORT TERM GOAL #4    Title  Patient will increase LUE strength to 3+/5 to increase ability to complete tasks at shoulder height with less difficulty.     Time  4    Period  Weeks    Status  On-going      OT SHORT TERM GOAL #5   Title  Patient will decrease pain level with shoulder movements and daily tasks to 4/10 or less.     Time  4    Period  Weeks    Status  On-going        OT Long Term Goals - 07/23/17 1228      OT LONG TERM GOAL #1   Title  Patient will return to highest level of independence with all daily tasks while able to return to work using his LUE as his non-dominant.     Time  8    Period  Weeks    Status  On-going      OT LONG TERM GOAL #2   Title  Patient will report a decreased pain level during daily and work related tasks of 2/10 or less.     Time  8    Period  Weeks    Status  On-going      OT LONG TERM GOAL #3   Title  Patient will increase A/ROM of LUE to WNL to increase ability to complete tasks above shoulder level as needed.     Time  8    Period  Weeks    Status  On-going      OT LONG TERM GOAL #4   Title  Patient will increase LUE strength to 4+/5 in order to be able to return to work and complete required tasks with less difficulty.     Time  8    Period  Weeks    Status  On-going            Plan - 07/27/17 1110    Clinical Impression Statement  A: Pt reports more comfort this session and was able to progress to UBE bike and sidelying A/ROM. VC for form and technique.  Protraction with red band required tactile cueing for proper form. patient only able to tolerate 9 due to weakness    Plan  P: Continue with protraction with red band if pain is continueing to be decreased.       Patient will benefit from skilled therapeutic intervention in order to improve the following deficits and impairments:  Pain, Impaired UE functional use, Increased fascial restrictions, Decreased range of motion  Visit Diagnosis: Other symptoms and signs involving the  musculoskeletal system  Acute pain of left shoulder  Stiffness of left shoulder, not elsewhere classified    Problem List Patient Active Problem List   Diagnosis Date Noted  . S/P rotator cuff repair 02/10/2014  . Pain in joint, shoulder region 02/10/2014  . Muscle weakness (generalized) 02/10/2014  . Decreased range of motion of right shoulder 02/10/2014   Ailene Ravel, OTR/L,CBIS  [phone]  07/27/2017, 11:19 AM  [place], [place], [address] Phone: [phone]   Fax:  [phone]  Name: Marcus Chen MRN: [id number] Date of Birth: 04/21/1966

## 2017-07-30 ENCOUNTER — Ambulatory Visit (HOSPITAL_COMMUNITY): Payer: PRIVATE HEALTH INSURANCE

## 2017-07-30 ENCOUNTER — Other Ambulatory Visit: Payer: Self-pay

## 2017-07-30 ENCOUNTER — Encounter (HOSPITAL_COMMUNITY): Payer: Self-pay

## 2017-07-30 DIAGNOSIS — M25512 Pain in left shoulder: Secondary | ICD-10-CM

## 2017-07-30 DIAGNOSIS — M25612 Stiffness of left shoulder, not elsewhere classified: Secondary | ICD-10-CM

## 2017-07-30 DIAGNOSIS — R29898 Other symptoms and signs involving the musculoskeletal system: Secondary | ICD-10-CM | POA: Diagnosis not present

## 2017-07-30 NOTE — Therapy (Signed)
[place], [place], [address] Phone: [phone]   Fax:  [phone]  Occupational Therapy Treatment  Patient Details  Name: Marcus Chen MRN: [id number] Date of Birth: [birth year] Referring Provider: Vickey Huger MD   Encounter Date: 07/30/2017  OT End of Session - 07/30/17 1107    Visit Number  15    Number of Visits  16    Date for OT Re-Evaluation  08/20/17    Authorization Type  [employer]    OT Start Time  1035    OT Stop Time  1115    OT Time Calculation (min)  40 min    Activity Tolerance  Patient tolerated treatment well    Behavior During Therapy  [facility] for tasks assessed/performed       Past Medical History:  Diagnosis Date  . AC (acromioclavicular) joint bone spurs    spurs  . Allergy   . Asthma   . Gout   . Kidney stone   . Post-operative nausea and vomiting   . Shoulder pain, bilateral     Past Surgical History:  Procedure Laterality Date  . APPENDECTOMY    . ROTATOR CUFF REPAIR  2014   right/ left shoulder was done 2010  . stomach reduction  2003  . TONSILLECTOMY      There were no vitals filed for this visit.  Subjective Assessment - 07/30/17 1057    Subjective   S: It feels a lot better than it has. I have a catch in there though.    Currently in Pain?  No/denies         [facility] OT Assessment - 07/30/17 1057      Assessment   Medical Diagnosis  left RTC repair, SAD, SA      Precautions   Precautions  Shoulder               OT Treatments/Exercises (OP) - 07/30/17 1057      Exercises   Exercises  Shoulder      Shoulder Exercises: Supine   Protraction  PROM;5 reps;AROM;15 reps    Horizontal ABduction  PROM;5 reps;AROM;15 reps    External Rotation  PROM;5 reps;AROM;15 reps    Internal Rotation  PROM;5 reps;AROM;15 reps    Flexion  PROM;5 reps;AROM;15 reps    ABduction  PROM;5 reps;AROM;15 reps    Other Supine Exercises  X to V arms; 15X A/ROM      Shoulder  Exercises: Prone   Other Prone Exercises  [place] exercises; 10X; A/ROM; H1, H4      Shoulder Exercises: Standing   Horizontal ABduction  AROM;15 reps    Flexion  AROM;15 reps    ABduction  AROM;15 reps      Shoulder Exercises: ROM/Strengthening   UBE (Upper Arm Bike)  Level 1 2' reverse 2' forward pace: 3.5-4.0    Proximal Shoulder Strengthening, Supine  15X      Manual Therapy   Manual Therapy  Myofascial release    Manual therapy comments  Manual therapy completed prior to exercises.    Myofascial Release  Myofascial release and manual stretching completed to left upper arm, trapezius, and scapularis region to decrease fascial restrictions and increase joint mobility in a pain free zone.                OT Short Term Goals - 07/25/17 1146      OT SHORT TERM GOAL #1   Title  Patient will be  educated and independent with HEP to increase functional performance during daily tasks using LUE.    Time  4    Period  Weeks      OT SHORT TERM GOAL #2   Title  patient will increase A/ROM to [facility] to increase ability to get dressed with less difficulty.    Time  4    Period  Weeks      OT SHORT TERM GOAL #3   Title  Patient will decrease fascial restrictions to trace amount or less to increase functional mobility in LUE needed for daily tasks.     Time  4    Period  Weeks    Status  On-going      OT SHORT TERM GOAL #4   Title  Patient will increase LUE strength to 3+/5 to increase ability to complete tasks at shoulder height with less difficulty.     Time  4    Period  Weeks    Status  On-going      OT SHORT TERM GOAL #5   Title  Patient will decrease pain level with shoulder movements and daily tasks to 4/10 or less.     Time  4    Period  Weeks    Status  On-going        OT Long Term Goals - 07/23/17 1228      OT LONG TERM GOAL #1   Title  Patient will return to highest level of independence with all daily tasks while able to return to work using his LUE as his  non-dominant.     Time  8    Period  Weeks    Status  On-going      OT LONG TERM GOAL #2   Title  Patient will report a decreased pain level during daily and work related tasks of 2/10 or less.     Time  8    Period  Weeks    Status  On-going      OT LONG TERM GOAL #3   Title  Patient will increase A/ROM of LUE to WNL to increase ability to complete tasks above shoulder level as needed.     Time  8    Period  Weeks    Status  On-going      OT LONG TERM GOAL #4   Title  Patient will increase LUE strength to 4+/5 in order to be able to return to work and complete required tasks with less difficulty.     Time  8    Period  Weeks    Status  On-going            Plan - 07/30/17 1108    Clinical Impression Statement  A: Unable to complete protraction with the bands this session as they were in use by another patient. Patient presents with decreased size trigger point at anterior deltoid region although continues to have tenderness during manual therapy. VC for form and technique as needed.     Plan  P: Continue with protraction with red band. Add ball on the wall.        Patient will benefit from skilled therapeutic intervention in order to improve the following deficits and impairments:  Pain, Impaired UE functional use, Increased fascial restrictions, Decreased range of motion  Visit Diagnosis: Other symptoms and signs involving the musculoskeletal system  Acute pain of left shoulder  Stiffness of left shoulder, not elsewhere classified    Problem List  Patient Active Problem List   Diagnosis Date Noted  . S/P rotator cuff repair 02/10/2014  . Pain in joint, shoulder region 02/10/2014  . Muscle weakness (generalized) 02/10/2014  . Decreased range of motion of right shoulder 02/10/2014   Ailene Ravel, OTR/L,CBIS  [phone]  07/30/2017, 11:14 AM  [place], [place], [address] Phone:  [phone]   Fax:  [phone]  Name: Marcus Chen MRN: [id number] Date of Birth: 06-05-1965

## 2017-08-01 ENCOUNTER — Other Ambulatory Visit: Payer: Self-pay

## 2017-08-01 ENCOUNTER — Ambulatory Visit (HOSPITAL_COMMUNITY): Payer: PRIVATE HEALTH INSURANCE

## 2017-08-01 ENCOUNTER — Encounter (HOSPITAL_COMMUNITY): Payer: Self-pay

## 2017-08-01 DIAGNOSIS — R29898 Other symptoms and signs involving the musculoskeletal system: Secondary | ICD-10-CM

## 2017-08-01 DIAGNOSIS — M25512 Pain in left shoulder: Secondary | ICD-10-CM

## 2017-08-01 DIAGNOSIS — M25612 Stiffness of left shoulder, not elsewhere classified: Secondary | ICD-10-CM

## 2017-08-01 NOTE — Therapy (Signed)
[place], [place], [address] Phone: [phone]   Fax:  [phone]  Occupational Therapy Treatment  Patient Details  Name: Marcus Chen MRN: [id number] Date of Birth: 1966/03/15 Referring Provider: Vickey Huger MD   Encounter Date: 08/01/2017  OT End of Session - 08/01/17 1434    Visit Number  16    Number of Visits  23    Date for OT Re-Evaluation  08/20/17    Authorization Type  [employer]    OT Start Time  1035    OT Stop Time  1120    OT Time Calculation (min)  45 min    Activity Tolerance  Patient tolerated treatment well    Behavior During Therapy  [facility] for tasks assessed/performed       Past Medical History:  Diagnosis Date  . AC (acromioclavicular) joint bone spurs    spurs  . Allergy   . Asthma   . Gout   . Kidney stone   . Post-operative nausea and vomiting   . Shoulder pain, bilateral     Past Surgical History:  Procedure Laterality Date  . APPENDECTOMY    . ROTATOR CUFF REPAIR  2014   right/ left shoulder was done 2010  . stomach reduction  2003  . TONSILLECTOMY      There were no vitals filed for this visit.  Subjective Assessment - 08/01/17 1039    Subjective   S: I haven't taken the time to stretch this morning or really any yesterday.     Currently in Pain?  No/denies         [facility] OT Assessment - 08/01/17 0001      Assessment   Medical Diagnosis  left RTC repair, SAD, SA      Precautions   Precautions  Shoulder               OT Treatments/Exercises (OP) - 08/01/17 1057      Exercises   Exercises  Shoulder      Shoulder Exercises: Supine   Protraction  PROM;5 reps;Strengthening;12 reps    Protraction Weight (lbs)  2    Horizontal ABduction  PROM;5 reps;Strengthening;12 reps    Horizontal ABduction Weight (lbs)  2    External Rotation  PROM;5 reps;Strengthening;12 reps    External Rotation Weight (lbs)  2    Internal Rotation  PROM;5  reps;Strengthening;12 reps    Internal Rotation Weight (lbs)  2    Flexion  PROM;5 reps;Strengthening;12 reps    Shoulder Flexion Weight (lbs)  2    ABduction  PROM;5 reps;Strengthening;12 reps    Shoulder ABduction Weight (lbs)  2      Shoulder Exercises: Standing   Protraction  Strengthening;12 reps    Theraband Level (Shoulder Protraction)  Level 2 (Red)  (Pended)     Protraction Weight (lbs)  2    Horizontal ABduction  Strengthening;12 reps    Horizontal ABduction Weight (lbs)  2    External Rotation  Strengthening;12 reps    External Rotation Weight (lbs)  2    Internal Rotation  Strengthening;12 reps    Internal Rotation Weight (lbs)  2    Flexion  Strengthening;12 reps    Shoulder Flexion Weight (lbs)  2    ABduction  Strengthening;10 reps    Shoulder ABduction Weight (lbs)  2    Extension  Theraband;12 reps    Theraband Level (Shoulder Extension)  Level 2 (Red)    Row  Theraband;12 reps    Theraband Level (Shoulder Row)  Level 2 (Red)      Shoulder Exercises: ROM/Strengthening   UBE (Upper Arm Bike)  Level 1 2' reverse 2' forward  (Pended)     Proximal Shoulder Strengthening, Supine  12X with 2# no rest breaks    Proximal Shoulder Strengthening, Seated  10X with 1# with 2 rest breaks    Ball on Wall  1' flexion 1' abduction green ball    Other ROM/Strengthening Exercises  Y arms lift off; 10X       Manual Therapy   Manual Therapy  Myofascial release    Manual therapy comments  Manual therapy completed prior to exercises.    Myofascial Release  Myofascial release and manual stretching completed to left upper arm, trapezius, and scapularis region to decrease fascial restrictions and increase joint mobility in a pain free zone.                OT Short Term Goals - 07/25/17 1146      OT SHORT TERM GOAL #1   Title  Patient will be educated and independent with HEP to increase functional performance during daily tasks using LUE.    Time  4    Period  Weeks       OT SHORT TERM GOAL #2   Title  patient will increase A/ROM to [facility] to increase ability to get dressed with less difficulty.    Time  4    Period  Weeks      OT SHORT TERM GOAL #3   Title  Patient will decrease fascial restrictions to trace amount or less to increase functional mobility in LUE needed for daily tasks.     Time  4    Period  Weeks    Status  On-going      OT SHORT TERM GOAL #4   Title  Patient will increase LUE strength to 3+/5 to increase ability to complete tasks at shoulder height with less difficulty.     Time  4    Period  Weeks    Status  On-going      OT SHORT TERM GOAL #5   Title  Patient will decrease pain level with shoulder movements and daily tasks to 4/10 or less.     Time  4    Period  Weeks    Status  On-going        OT Long Term Goals - 07/23/17 1228      OT LONG TERM GOAL #1   Title  Patient will return to highest level of independence with all daily tasks while able to return to work using his LUE as his non-dominant.     Time  8    Period  Weeks    Status  On-going      OT LONG TERM GOAL #2   Title  Patient will report a decreased pain level during daily and work related tasks of 2/10 or less.     Time  8    Period  Weeks    Status  On-going      OT LONG TERM GOAL #3   Title  Patient will increase A/ROM of LUE to WNL to increase ability to complete tasks above shoulder level as needed.     Time  8    Period  Weeks    Status  On-going      OT LONG TERM GOAL #4   Title  Patient  will increase LUE strength to 4+/5 in order to be able to return to work and complete required tasks with less difficulty.     Time  8    Period  Weeks    Status  On-going            Plan - 08/01/17 1435    Clinical Impression Statement  A: Progressed to 2# handweight for strengthening. patient continues to have trigger point mainly in left anterior deltoid region which cases a discomforting catching sensation during abduction supine and standing.  Added Y arms lift from wall to increase scapular strength. VC for form and technique.    Plan  P: Add horizontal abduction with serratus plus using red band.     Consulted and Agree with Plan of Care  Patient       Patient will benefit from skilled therapeutic intervention in order to improve the following deficits and impairments:  Pain, Impaired UE functional use, Increased fascial restrictions, Decreased range of motion  Visit Diagnosis: Other symptoms and signs involving the musculoskeletal system  Acute pain of left shoulder  Stiffness of left shoulder, not elsewhere classified    Problem List Patient Active Problem List   Diagnosis Date Noted  . S/P rotator cuff repair 02/10/2014  . Pain in joint, shoulder region 02/10/2014  . Muscle weakness (generalized) 02/10/2014  . Decreased range of motion of right shoulder 02/10/2014   Ailene Ravel, OTR/L,CBIS  [phone]  08/01/2017, 2:37 PM  [place] [address] [place], [place], [address] Phone: [phone]   Fax:  [phone]  Name: Marcus Chen MRN: [id number] Date of Birth: [birth year]

## 2017-08-03 ENCOUNTER — Encounter (HOSPITAL_COMMUNITY): Payer: Self-pay

## 2017-08-03 ENCOUNTER — Ambulatory Visit (HOSPITAL_COMMUNITY): Payer: PRIVATE HEALTH INSURANCE

## 2017-08-03 ENCOUNTER — Other Ambulatory Visit: Payer: Self-pay

## 2017-08-03 DIAGNOSIS — M25612 Stiffness of left shoulder, not elsewhere classified: Secondary | ICD-10-CM

## 2017-08-03 DIAGNOSIS — M25512 Pain in left shoulder: Secondary | ICD-10-CM

## 2017-08-03 DIAGNOSIS — R29898 Other symptoms and signs involving the musculoskeletal system: Secondary | ICD-10-CM

## 2017-08-03 NOTE — Therapy (Signed)
[place], [place], [address] Phone: [phone]   Fax:  [phone]  Occupational Therapy Treatment  Patient Details  Name: KENDRIC SINDELAR MRN: [id number] Date of Birth: 12/04/1965 Referring Provider: Vickey Huger MD   Encounter Date: 08/03/2017  OT End of Session - 08/03/17 1131    Visit Number  17    Number of Visits  23    Date for OT Re-Evaluation  08/20/17    Authorization Type  [employer]    OT Start Time  1035    OT Stop Time  1118    OT Time Calculation (min)  43 min    Activity Tolerance  Patient tolerated treatment well    Behavior During Therapy  [facility] for tasks assessed/performed       Past Medical History:  Diagnosis Date  . AC (acromioclavicular) joint bone spurs    spurs  . Allergy   . Asthma   . Gout   . Kidney stone   . Post-operative nausea and vomiting   . Shoulder pain, bilateral     Past Surgical History:  Procedure Laterality Date  . APPENDECTOMY    . ROTATOR CUFF REPAIR  2014   right/ left shoulder was done 2010  . stomach reduction  2003  . TONSILLECTOMY      There were no vitals filed for this visit.  Subjective Assessment - 08/03/17 1042    Subjective   S: It feels tight, tender, and sore.     Currently in Pain?  No/denies         [facility] OT Assessment - 08/03/17 0001      Assessment   Medical Diagnosis  left RTC repair, SAD, SA      Precautions   Precautions  Shoulder               OT Treatments/Exercises (OP) - 08/03/17 0001      Exercises   Exercises  Shoulder      Shoulder Exercises: Supine   Protraction  PROM;5 reps;Strengthening;12 reps    Protraction Weight (lbs)  2    Horizontal ABduction  PROM;5 reps;Strengthening;12 reps    Horizontal ABduction Weight (lbs)  2    External Rotation  PROM;5 reps;Strengthening;12 reps    External Rotation Weight (lbs)  2    Internal Rotation  PROM;5 reps;Strengthening;12 reps    Internal Rotation Weight  (lbs)  2    Flexion  PROM;5 reps;Strengthening;12 reps    Shoulder Flexion Weight (lbs)  2    ABduction  PROM;5 reps;Strengthening;12 reps    Shoulder ABduction Weight (lbs)  2      Shoulder Exercises: Prone   Flexion  Strengthening;10 reps    Flexion Weight (lbs)  2    Horizontal ABduction 1  Strengthening;10 reps    Horizontal ABduction 1 Weight (lbs)  2    Other Prone Exercises  Hugston exercise H1. H4    Other Prone Exercises  Scaption 2# 10X      Shoulder Exercises: ROM/Strengthening   UBE (Upper Arm Bike)  Level 3 reverse 3' forward 3 minutes 3.5-4.0 pace    Other ROM/Strengthening Exercises  Scapular strengthening exercise lateral and diagonal wall slides using green band 10X each range    Other ROM/Strengthening Exercises  Horizontal abduction with serratus plus using green band 6X up      Manual Therapy   Manual Therapy  Myofascial release    Manual therapy comments  Manual therapy completed  prior to exercises.    Myofascial Release  Myofascial release and manual stretching completed to left upper arm, trapezius, and scapularis region to decrease fascial restrictions and increase joint mobility in a pain free zone.                OT Short Term Goals - 07/25/17 1146      OT SHORT TERM GOAL #1   Title  Patient will be educated and independent with HEP to increase functional performance during daily tasks using LUE.    Time  4    Period  Weeks      OT SHORT TERM GOAL #2   Title  patient will increase A/ROM to [facility] to increase ability to get dressed with less difficulty.    Time  4    Period  Weeks      OT SHORT TERM GOAL #3   Title  Patient will decrease fascial restrictions to trace amount or less to increase functional mobility in LUE needed for daily tasks.     Time  4    Period  Weeks    Status  On-going      OT SHORT TERM GOAL #4   Title  Patient will increase LUE strength to 3+/5 to increase ability to complete tasks at shoulder height with less  difficulty.     Time  4    Period  Weeks    Status  On-going      OT SHORT TERM GOAL #5   Title  Patient will decrease pain level with shoulder movements and daily tasks to 4/10 or less.     Time  4    Period  Weeks    Status  On-going        OT Long Term Goals - 07/23/17 1228      OT LONG TERM GOAL #1   Title  Patient will return to highest level of independence with all daily tasks while able to return to work using his LUE as his non-dominant.     Time  8    Period  Weeks    Status  On-going      OT LONG TERM GOAL #2   Title  Patient will report a decreased pain level during daily and work related tasks of 2/10 or less.     Time  8    Period  Weeks    Status  On-going      OT LONG TERM GOAL #3   Title  Patient will increase A/ROM of LUE to WNL to increase ability to complete tasks above shoulder level as needed.     Time  8    Period  Weeks    Status  On-going      OT LONG TERM GOAL #4   Title  Patient will increase LUE strength to 4+/5 in order to be able to return to work and complete required tasks with less difficulty.     Time  8    Period  Weeks    Status  On-going            Plan - 08/03/17 1114    Clinical Impression Statement  A: Added horizontal abduction with serratur plus green band and prone strengthening. Pt showed an increase in strength in hughston exercise from previous time exercise was completed. Pt. required verbal cues for technique on new exercises.  Pt was able to increase bike  to level three and 3' forwards and in reverse.  Pt reports feeling an increase in muscle fatigure at the higher bike level.     Plan  P: Continue to focus on scapular and middle trapezius strength. Omit supine strengthening.     Consulted and Agree with Plan of Care  Patient       Patient will benefit from skilled therapeutic intervention in order to improve the following deficits and impairments:  Pain, Impaired UE functional use, Increased fascial restrictions,  Decreased range of motion  Visit Diagnosis: Other symptoms and signs involving the musculoskeletal system  Acute pain of left shoulder  Stiffness of left shoulder, not elsewhere classified    Problem List Patient Active Problem List   Diagnosis Date Noted  . S/P rotator cuff repair 02/10/2014  . Pain in joint, shoulder region 02/10/2014  . Muscle weakness (generalized) 02/10/2014  . Decreased range of motion of right shoulder 02/10/2014   Ailene Ravel, OTR/L,CBIS  [phone]  08/03/2017, 11:42 AM  [place] [address] [place], [place], [address] Phone: [phone]   Fax:  [phone]  Name: JOANNA HALL MRN: [id number] Date of Birth: 1966/02/13

## 2017-08-06 ENCOUNTER — Ambulatory Visit (HOSPITAL_COMMUNITY): Payer: PRIVATE HEALTH INSURANCE

## 2017-08-06 DIAGNOSIS — R29898 Other symptoms and signs involving the musculoskeletal system: Secondary | ICD-10-CM

## 2017-08-06 DIAGNOSIS — M25512 Pain in left shoulder: Secondary | ICD-10-CM

## 2017-08-06 DIAGNOSIS — M25612 Stiffness of left shoulder, not elsewhere classified: Secondary | ICD-10-CM

## 2017-08-06 NOTE — Therapy (Signed)
[place], [place], [address] Phone: [phone]   Fax:  [phone]  Occupational Therapy Treatment  Patient Details  Name: Marcus Chen MRN: [id number] Date of Birth: January 09, 1966 Referring Provider: Vickey Huger MD   Encounter Date: 08/06/2017  OT End of Session - 08/06/17 1159    Visit Number  18    Number of Visits  23    Date for OT Re-Evaluation  08/20/17    Authorization Type  [employer]    OT Start Time  1045    OT Stop Time  1127    OT Time Calculation (min)  42 min    Activity Tolerance  Patient tolerated treatment well    Behavior During Therapy  [facility] for tasks assessed/performed       Past Medical History:  Diagnosis Date  . AC (acromioclavicular) joint bone spurs    spurs  . Allergy   . Asthma   . Gout   . Kidney stone   . Post-operative nausea and vomiting   . Shoulder pain, bilateral     Past Surgical History:  Procedure Laterality Date  . APPENDECTOMY    . ROTATOR CUFF REPAIR  2014   right/ left shoulder was done 2010  . stomach reduction  2003  . TONSILLECTOMY      There were no vitals filed for this visit.  Subjective Assessment - 08/06/17 1158    Subjective   S: Everything just feels really tight in there today.    Currently in Pain?  No/denies         [facility] OT Assessment - 08/06/17 1107      Assessment   Medical Diagnosis  left RTC repair, SAD, SA      Precautions   Precautions  Shoulder    Type of Shoulder Precautions  Pt reports that last MD visit he was placed on a 20# weight lifting rerstriction with no lifting overhead.               OT Treatments/Exercises (OP) - 08/06/17 1108      Exercises   Exercises  Shoulder      Shoulder Exercises: Supine   Protraction  PROM;5 reps    Horizontal ABduction  PROM;5 reps    External Rotation  PROM;5 reps    Internal Rotation  PROM;5 reps    Flexion  PROM;5 reps    ABduction  PROM;5 reps      Shoulder  Exercises: Prone   Flexion  Strengthening;10 reps    Flexion Weight (lbs)  2    Other Prone Exercises  Hugston exercise H1. H4 12X A/ROM      Shoulder Exercises: ROM/Strengthening   UBE (Upper Arm Bike)  Level 3 reverse 2' forward 2 minutes pace 3.5-4.0    Ball on Wall  1' flexion 1' abduction green ball    Other ROM/Strengthening Exercises  Scapular strengthening exercise lateral and diagonal wall slides using red band 10X each range    Other ROM/Strengthening Exercises  Horizontal abduction with serratus plus using red band 6X up      Manual Therapy   Manual Therapy  Myofascial release    Manual therapy comments  Manual therapy completed prior to exercises.    Myofascial Release  Myofascial release and manual stretching completed to left upper arm, trapezius, and scapularis region to decrease fascial restrictions and increase joint mobility in a pain free zone.  OT Short Term Goals - 07/25/17 1146      OT SHORT TERM GOAL #1   Title  Patient will be educated and independent with HEP to increase functional performance during daily tasks using LUE.    Time  4    Period  Weeks      OT SHORT TERM GOAL #2   Title  patient will increase A/ROM to [facility] to increase ability to get dressed with less difficulty.    Time  4    Period  Weeks      OT SHORT TERM GOAL #3   Title  Patient will decrease fascial restrictions to trace amount or less to increase functional mobility in LUE needed for daily tasks.     Time  4    Period  Weeks    Status  On-going      OT SHORT TERM GOAL #4   Title  Patient will increase LUE strength to 3+/5 to increase ability to complete tasks at shoulder height with less difficulty.     Time  4    Period  Weeks    Status  On-going      OT SHORT TERM GOAL #5   Title  Patient will decrease pain level with shoulder movements and daily tasks to 4/10 or less.     Time  4    Period  Weeks    Status  On-going        OT Long Term Goals -  07/23/17 1228      OT LONG TERM GOAL #1   Title  Patient will return to highest level of independence with all daily tasks while able to return to work using his LUE as his non-dominant.     Time  8    Period  Weeks    Status  On-going      OT LONG TERM GOAL #2   Title  Patient will report a decreased pain level during daily and work related tasks of 2/10 or less.     Time  8    Period  Weeks    Status  On-going      OT LONG TERM GOAL #3   Title  Patient will increase A/ROM of LUE to WNL to increase ability to complete tasks above shoulder level as needed.     Time  8    Period  Weeks    Status  On-going      OT LONG TERM GOAL #4   Title  Patient will increase LUE strength to 4+/5 in order to be able to return to work and complete required tasks with less difficulty.     Time  8    Period  Weeks    Status  On-going            Plan - 08/06/17 1159    Clinical Impression Statement  A: Focused on scapular and shoulder strengthening this session. patient presented with better form during hughston exercieses. Pt reports that exercises that isolate the trigger points in his rotator cuff help the most. VC for form and technique as needed.     Plan  P: Omit supine strengthening. Continue with theraband strengthening.       Patient will benefit from skilled therapeutic intervention in order to improve the following deficits and impairments:  Pain, Impaired UE functional use, Increased fascial restrictions, Decreased range of motion  Visit Diagnosis: Stiffness of left shoulder, not elsewhere classified  Other symptoms and signs  involving the musculoskeletal system  Acute pain of left shoulder    Problem List Patient Active Problem List   Diagnosis Date Noted  . S/P rotator cuff repair 02/10/2014  . Pain in joint, shoulder region 02/10/2014  . Muscle weakness (generalized) 02/10/2014  . Decreased range of motion of right shoulder 02/10/2014   Marcus Chen,  OTR/L,CBIS  [phone]  08/06/2017, 12:07 PM  [place] [address] [place], [place], [address] Phone: [phone]   Fax:  [phone]  Name: Marcus Chen MRN: [id number] Date of Birth: 1965/10/07

## 2017-08-08 ENCOUNTER — Ambulatory Visit (HOSPITAL_COMMUNITY): Payer: PRIVATE HEALTH INSURANCE | Admitting: Specialist

## 2017-08-08 ENCOUNTER — Encounter (HOSPITAL_COMMUNITY): Payer: Self-pay | Admitting: Specialist

## 2017-08-08 DIAGNOSIS — M25612 Stiffness of left shoulder, not elsewhere classified: Secondary | ICD-10-CM

## 2017-08-08 DIAGNOSIS — R29898 Other symptoms and signs involving the musculoskeletal system: Secondary | ICD-10-CM

## 2017-08-08 DIAGNOSIS — M25512 Pain in left shoulder: Secondary | ICD-10-CM

## 2017-08-08 NOTE — Therapy (Signed)
[place], [place], [address] Phone: [phone]   Fax:  [phone]  Occupational Therapy Treatment  Patient Details  Name: Marcus Chen MRN: [id number] Date of Birth: 12/06/1965 Referring Provider: Vickey Huger MD   Encounter Date: 08/08/2017  OT End of Session - 08/08/17 1206    Visit Number  19    Number of Visits  23    Date for OT Re-Evaluation  08/20/17    Authorization Type  [employer]    OT Start Time  1035    OT Stop Time  1115    OT Time Calculation (min)  40 min    Activity Tolerance  Patient tolerated treatment well    Behavior During Therapy  [facility] for tasks assessed/performed       Past Medical History:  Diagnosis Date  . AC (acromioclavicular) joint bone spurs    spurs  . Allergy   . Asthma   . Gout   . Kidney stone   . Post-operative nausea and vomiting   . Shoulder pain, bilateral     Past Surgical History:  Procedure Laterality Date  . APPENDECTOMY    . ROTATOR CUFF REPAIR  2014   right/ left shoulder was done 2010  . stomach reduction  2003  . TONSILLECTOMY      There were no vitals filed for this visit.  Subjective Assessment - 08/08/17 1206    Subjective   S:  I am just really sore this week.     Currently in Pain?  Yes    Pain Score  2     Pain Orientation  Left    Pain Descriptors / Indicators  Aching    Pain Type  Acute pain         [facility] OT Assessment - 08/08/17 0001      Assessment   Medical Diagnosis  left RTC repair, SAD, SA      Precautions   Precautions  Shoulder    Type of Shoulder Precautions  Pt reports that last MD visit he was placed on a 20# weight lifting rerstriction with no lifting overhead.               OT Treatments/Exercises (OP) - 08/08/17 0001      Exercises   Exercises  Shoulder      Shoulder Exercises: Supine   Protraction  PROM;5 reps    Horizontal ABduction  PROM;5 reps    External Rotation  PROM;5 reps    Internal  Rotation  PROM;5 reps    Flexion  PROM;5 reps    ABduction  PROM;5 reps      Shoulder Exercises: Prone   Retraction  Strengthening;15 reps    Retraction Weight (lbs)  2    Flexion  Strengthening;15 reps    Flexion Weight (lbs)  2    Extension  Strengthening;15 reps    Extension Weight (lbs)  2    External Rotation  Strengthening;15 reps    External Rotation Weight (lbs)  2    Internal Rotation  Strengthening;15 reps    Internal Rotation Weight (lbs)  2    Horizontal ABduction 1  Strengthening;15 reps    Horizontal ABduction 1 Weight (lbs)  2    Horizontal ABduction 2  Strengthening;15 reps    Horizontal ABduction 2 Weight (lbs)  2      Shoulder Exercises: ROM/Strengthening   Other ROM/Strengthening Exercises  holding large green therapyball, chest press, overhead press,  flexionl overhead cirle 15 times each with moderate difficulty      Manual Therapy   Manual Therapy  Myofascial release    Manual therapy comments  Manual therapy completed prior to exercises.    Myofascial Release  Myofascial release and manual stretching completed to left upper arm, trapezius, and scapularis region to decrease fascial restrictions and increase joint mobility in a pain free zone.                OT Short Term Goals - 07/25/17 1146      OT SHORT TERM GOAL #1   Title  Patient will be educated and independent with HEP to increase functional performance during daily tasks using LUE.    Time  4    Period  Weeks      OT SHORT TERM GOAL #2   Title  patient will increase A/ROM to [facility] to increase ability to get dressed with less difficulty.    Time  4    Period  Weeks      OT SHORT TERM GOAL #3   Title  Patient will decrease fascial restrictions to trace amount or less to increase functional mobility in LUE needed for daily tasks.     Time  4    Period  Weeks    Status  On-going      OT SHORT TERM GOAL #4   Title  Patient will increase LUE strength to 3+/5 to increase ability to  complete tasks at shoulder height with less difficulty.     Time  4    Period  Weeks    Status  On-going      OT SHORT TERM GOAL #5   Title  Patient will decrease pain level with shoulder movements and daily tasks to 4/10 or less.     Time  4    Period  Weeks    Status  On-going        OT Long Term Goals - 07/23/17 1228      OT LONG TERM GOAL #1   Title  Patient will return to highest level of independence with all daily tasks while able to return to work using his LUE as his non-dominant.     Time  8    Period  Weeks    Status  On-going      OT LONG TERM GOAL #2   Title  Patient will report a decreased pain level during daily and work related tasks of 2/10 or less.     Time  8    Period  Weeks    Status  On-going      OT LONG TERM GOAL #3   Title  Patient will increase A/ROM of LUE to WNL to increase ability to complete tasks above shoulder level as needed.     Time  8    Period  Weeks    Status  On-going      OT LONG TERM GOAL #4   Title  Patient will increase LUE strength to 4+/5 in order to be able to return to work and complete required tasks with less difficulty.     Time  8    Period  Weeks    Status  On-going            Plan - 08/08/17 1206    Clinical Impression Statement  A:  Treatment focused heavily on manual therapy for decreased restrictions and pain that is currently limiting his overall participation  in daily and work tasks.  Good vasomotor response and improved range after myofascial release intervention.      Plan  P:  Continue prone strengthening and functional overhead reaching/strengthening with therapy ball, weighted box etc while following protocol.        Patient will benefit from skilled therapeutic intervention in order to improve the following deficits and impairments:  Pain, Impaired UE functional use, Increased fascial restrictions, Decreased range of motion  Visit Diagnosis: Stiffness of left shoulder, not elsewhere  classified  Other symptoms and signs involving the musculoskeletal system  Acute pain of left shoulder    Problem List Patient Active Problem List   Diagnosis Date Noted  . S/P rotator cuff repair 02/10/2014  . Pain in joint, shoulder region 02/10/2014  . Muscle weakness (generalized) 02/10/2014  . Decreased range of motion of right shoulder 02/10/2014    Vangie Bicker, [place], OTR/L [phone]  08/08/2017, 12:13 PM  [place] [address] [place], [place], [address] Phone: [phone]   Fax:  [phone]  Name: Marcus Chen MRN: [id number] Date of Birth: 08-17-1965

## 2017-08-10 ENCOUNTER — Other Ambulatory Visit: Payer: Self-pay

## 2017-08-10 ENCOUNTER — Ambulatory Visit (HOSPITAL_COMMUNITY): Payer: PRIVATE HEALTH INSURANCE

## 2017-08-10 ENCOUNTER — Encounter (HOSPITAL_COMMUNITY): Payer: Self-pay

## 2017-08-10 DIAGNOSIS — M25512 Pain in left shoulder: Secondary | ICD-10-CM

## 2017-08-10 DIAGNOSIS — R29898 Other symptoms and signs involving the musculoskeletal system: Secondary | ICD-10-CM | POA: Diagnosis not present

## 2017-08-10 DIAGNOSIS — M25612 Stiffness of left shoulder, not elsewhere classified: Secondary | ICD-10-CM

## 2017-08-10 NOTE — Therapy (Signed)
[place], [place], [address] Phone: [phone]   Fax:  [phone]  Occupational Therapy Treatment  Patient Details  Name: Marcus Chen MRN: [id number] Date of Birth: May 21, 1966 Referring Provider: Vickey Huger MD   Encounter Date: 08/10/2017  OT End of Session - 08/10/17 1114    Visit Number  20    Number of Visits  23    Date for OT Re-Evaluation  08/20/17    Authorization Type  [employer]    OT Start Time  1035    OT Stop Time  1116    OT Time Calculation (min)  41 min    Activity Tolerance  Patient tolerated treatment well    Behavior During Therapy  [facility] for tasks assessed/performed       Past Medical History:  Diagnosis Date  . AC (acromioclavicular) joint bone spurs    spurs  . Allergy   . Asthma   . Gout   . Kidney stone   . Post-operative nausea and vomiting   . Shoulder pain, bilateral     Past Surgical History:  Procedure Laterality Date  . APPENDECTOMY    . ROTATOR CUFF REPAIR  2014   right/ left shoulder was done 2010  . stomach reduction  2003  . TONSILLECTOMY      There were no vitals filed for this visit.  Subjective Assessment - 08/10/17 1102    Subjective   S: [name] did some new stuff that really worked my shoulder.    Currently in Pain?  No/denies         [facility] OT Assessment - 08/10/17 1103      Assessment   Medical Diagnosis  left RTC repair, SAD, SA      Precautions   Precautions  Shoulder    Type of Shoulder Precautions  Pt reports that last MD visit he was placed on a 20# weight lifting rerstriction with no lifting overhead.               OT Treatments/Exercises (OP) - 08/10/17 1104      Exercises   Exercises  Shoulder      Shoulder Exercises: Supine   Protraction  PROM;5 reps    Horizontal ABduction  PROM;5 reps    External Rotation  PROM;5 reps    Internal Rotation  PROM;5 reps    Flexion  PROM;5 reps    ABduction  PROM;5 reps      Shoulder  Exercises: ROM/Strengthening   UBE (Upper Arm Bike)  Level 3 reverse 2' forward 2 minutes pace; 3.5-4.0    Cybex Press  3 plate;3.5 plate;15 reps 4 plate    Cybex Row  3 plate;15 reps    Other ROM/Strengthening Exercises  Large green therapy; 15X; chest press, flexion, PNF diagonals, circles    Other ROM/Strengthening Exercises  Arms on Fire; 3 minutes total  for 30 seconds at each position      Manual Therapy   Manual Therapy  Myofascial release    Manual therapy comments  Manual therapy completed prior to exercises.    Myofascial Release  Myofascial release and manual stretching completed to left upper arm, trapezius, and scapularis region to decrease fascial restrictions and increase joint mobility in a pain free zone.                OT Short Term Goals - 07/25/17 1146      OT SHORT TERM GOAL #1   Title  Patient will be educated and independent with HEP to increase functional performance during daily tasks using LUE.    Time  4    Period  Weeks      OT SHORT TERM GOAL #2   Title  patient will increase A/ROM to [facility] to increase ability to get dressed with less difficulty.    Time  4    Period  Weeks      OT SHORT TERM GOAL #3   Title  Patient will decrease fascial restrictions to trace amount or less to increase functional mobility in LUE needed for daily tasks.     Time  4    Period  Weeks    Status  On-going      OT SHORT TERM GOAL #4   Title  Patient will increase LUE strength to 3+/5 to increase ability to complete tasks at shoulder height with less difficulty.     Time  4    Period  Weeks    Status  On-going      OT SHORT TERM GOAL #5   Title  Patient will decrease pain level with shoulder movements and daily tasks to 4/10 or less.     Time  4    Period  Weeks    Status  On-going        OT Long Term Goals - 07/23/17 1228      OT LONG TERM GOAL #1   Title  Patient will return to highest level of independence with all daily tasks while able to return to  work using his LUE as his non-dominant.     Time  8    Period  Weeks    Status  On-going      OT LONG TERM GOAL #2   Title  Patient will report a decreased pain level during daily and work related tasks of 2/10 or less.     Time  8    Period  Weeks    Status  On-going      OT LONG TERM GOAL #3   Title  Patient will increase A/ROM of LUE to WNL to increase ability to complete tasks above shoulder level as needed.     Time  8    Period  Weeks    Status  On-going      OT LONG TERM GOAL #4   Title  Patient will increase LUE strength to 4+/5 in order to be able to return to work and complete required tasks with less difficulty.     Time  8    Period  Weeks    Status  On-going            Plan - 08/10/17 1114    Clinical Impression Statement  A: Added Arms on Fire to session to focus on shoulder stability and strength. Continued with therapy for strengthening and added Cybex row and press. Dueing UBE bike he used LUE primarily while focusing on keeping a 3.5-4.0 pace. VC for form and technique.     Plan  P: Continue with strengthening and functional overhead reaching/strengthening while keeping within 20# limit.     Consulted and Agree with Plan of Care  Patient       Patient will benefit from skilled therapeutic intervention in order to improve the following deficits and impairments:  Pain, Impaired UE functional use, Increased fascial restrictions, Decreased range of motion  Visit Diagnosis: Stiffness of left shoulder, not elsewhere classified  Other symptoms and  signs involving the musculoskeletal system  Acute pain of left shoulder    Problem List Patient Active Problem List   Diagnosis Date Noted  . S/P rotator cuff repair 02/10/2014  . Pain in joint, shoulder region 02/10/2014  . Muscle weakness (generalized) 02/10/2014  . Decreased range of motion of right shoulder 02/10/2014    Marcus Chen, OTR/L,CBIS  [phone]  08/10/2017, 11:17 AM  [place], [place], [address] Phone: [phone]   Fax:  [phone]  Name: Marcus Chen MRN: [id number] Date of Birth: Sep 03, 1965

## 2017-08-13 ENCOUNTER — Ambulatory Visit (HOSPITAL_COMMUNITY): Payer: PRIVATE HEALTH INSURANCE

## 2017-08-13 ENCOUNTER — Other Ambulatory Visit: Payer: Self-pay

## 2017-08-13 ENCOUNTER — Encounter (HOSPITAL_COMMUNITY): Payer: Self-pay

## 2017-08-13 DIAGNOSIS — R29898 Other symptoms and signs involving the musculoskeletal system: Secondary | ICD-10-CM | POA: Diagnosis not present

## 2017-08-13 DIAGNOSIS — M25612 Stiffness of left shoulder, not elsewhere classified: Secondary | ICD-10-CM

## 2017-08-13 DIAGNOSIS — M25512 Pain in left shoulder: Secondary | ICD-10-CM

## 2017-08-13 NOTE — Therapy (Signed)
[place], [place], [address] Phone: [phone]   Fax:  [phone]  Occupational Therapy Treatment  Patient Details  Name: Marcus Chen MRN: [id number] Date of Birth: 03-15-1966 Referring Provider: Vickey Huger MD   Encounter Date: 08/13/2017  OT End of Session - 08/13/17 1151    Visit Number  21    Number of Visits  23    Date for OT Re-Evaluation  09/12/17    Authorization Type  [employer]    OT Start Time  1036 reassessment    OT Stop Time  1115    OT Time Calculation (min)  39 min    Activity Tolerance  Patient tolerated treatment well    Behavior During Therapy  [facility] for tasks assessed/performed       Past Medical History:  Diagnosis Date  . AC (acromioclavicular) joint bone spurs    spurs  . Allergy   . Asthma   . Gout   . Kidney stone   . Post-operative nausea and vomiting   . Shoulder pain, bilateral     Past Surgical History:  Procedure Laterality Date  . APPENDECTOMY    . ROTATOR CUFF REPAIR  2014   right/ left shoulder was done 2010  . stomach reduction  2003  . TONSILLECTOMY      There were no vitals filed for this visit.  Subjective Assessment - 08/13/17 1146    Subjective   S: It actually feels really good today.    Currently in Pain?  No/denies         [facility] OT Assessment - 08/13/17 1147      Assessment   Medical Diagnosis  left RTC repair, SAD, SA      Precautions   Precautions  Shoulder    Type of Shoulder Precautions  Pt reports that last MD visit he was placed on a 20# weight lifting rerstriction with no lifting overhead.               08/13/17 1058  Exercises  Exercises Shoulder  Shoulder Exercises: Supine  Protraction PROM;5 reps  Horizontal ABduction PROM;5 reps  External Rotation PROM;5 reps  Internal Rotation PROM;5 reps  Flexion PROM;5 reps  ABduction PROM;5 reps  Shoulder Exercises: Standing  Protraction Theraband;15 reps  Theraband Level  (Shoulder Protraction) Level 2 (Red)  Horizontal ABduction Theraband;15 reps  Theraband Level (Shoulder Horizontal ABduction) Level 2 (Red)  Diagonals Theraband;10 reps  Theraband Level (Shoulder Diagonals) Level 2 (Red)  Other Standing Exercises Shoulder press; 10X; red band  Shoulder Exercises: ROM/Strengthening  Other ROM/Strengthening Exercises Large green therapy; 15X; chest press, flexion, PNF diagonals, circles  Other ROM/Strengthening Exercises Arms on Fire; 3 minutes 30 seconds total  for 30 seconds at each position  Manual Therapy  Manual Therapy Myofascial release  Manual therapy comments Manual therapy completed prior to exercises.  Myofascial Release Myofascial release and manual stretching completed to left upper arm, trapezius, and scapularis region to decrease fascial restrictions and increase joint mobility in a pain free zone.      08/13/17 1039  Assessment  Medical Diagnosis left RTC repair, SAD, SA  Precautions  Precautions Shoulder  Type of Shoulder Precautions Pt reports that last MD visit he was placed on a 20# weight lifting rerstriction with no lifting overhead.  AROM  Left Shoulder Flexion 170 Degrees (previous: 160)  Left Shoulder ABduction 170 Degrees (previous: 172)  Left Shoulder External Rotation 87 Degrees (previous; 75 Abducted: 90)  Left Shoulder Internal Rotation 90 Degrees (previous: same)  AROM Assessment Site Shoulder  Overall AROM Comments Assessed seated. IR/er adducted  Right/Left Shoulder Left  PROM  Overall PROM Comments Assessed supine. IR/er adducted.  Overall PROM  Within functional limits for tasks performed  Strength  Left Shoulder Flexion 4/5 (previous: same)  Left Shoulder ABduction 4/5 (previous: 3/5)  Left Shoulder External Rotation 3+/5 (previous: 3/5)  Left Shoulder Internal Rotation 5/5 (previous; 3+/5)  Strength Assessment Site Shoulder  Right/Left Shoulder Left  Overall Strength Comments Assessed seated. IR/er  adducted     OT Short Term Goals - 08/13/17 1152      OT SHORT TERM GOAL #1   Title  Patient will be educated and independent with HEP to increase functional performance during daily tasks using LUE.    Time  4    Period  Weeks      OT SHORT TERM GOAL #2   Title  patient will increase A/ROM to [facility] to increase ability to get dressed with less difficulty.    Time  4    Period  Weeks      OT SHORT TERM GOAL #3   Title  Patient will decrease fascial restrictions to trace amount or less to increase functional mobility in LUE needed for daily tasks.     Time  4    Period  Weeks    Status  On-going      OT SHORT TERM GOAL #4   Title  Patient will increase LUE strength to 3+/5 to increase ability to complete tasks at shoulder height with less difficulty.     Time  4    Period  Weeks    Status  Achieved      OT SHORT TERM GOAL #5   Title  Patient will decrease pain level with shoulder movements and daily tasks to 4/10 or less.     Time  4    Period  Weeks    Status  Achieved        OT Long Term Goals - 08/13/17 1152      OT LONG TERM GOAL #1   Title  Patient will return to highest level of independence with all daily tasks while able to return to work using his LUE as his non-dominant.     Time  8    Period  Weeks    Status  On-going      OT LONG TERM GOAL #2   Title  Patient will report a decreased pain level during daily and work related tasks of 2/10 or less.     Time  8    Period  Weeks    Status  On-going      OT LONG TERM GOAL #3   Title  Patient will increase A/ROM of LUE to WNL to increase ability to complete tasks above shoulder level as needed.     Time  8    Period  Weeks    Status  Achieved      OT LONG TERM GOAL #4   Title  Patient will increase LUE strength to 4+/5 in order to be able to return to work and complete required tasks with less difficulty.     Time  8    Period  Weeks    Status  On-going            Plan - 08/13/17 1153     Clinical Impression Statement  A: Pt progressed with Arms  on Fire and was able to increase his time by 30 seconds. Reassessment completed this date for MD appointment tomorrow. Patient's A/ROM and P/ROM is WNL during this stage of therapy. He continues to have deficits in in strength, trigger points, and periodic pain with certain. Patient has met all but one STG and has met 1 LTG. patient will benefit from continuing OT services to increase LUE strength in order to return to work activities.     Plan  P: Follow up on MD appointment. 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 Complete FOTO.     Consulted and Agree with Plan of Care  Patient       Patient will benefit from skilled therapeutic intervention in order to improve the following deficits and impairments:  Pain, Impaired UE functional use, Increased fascial restrictions, Decreased range of motion  Visit Diagnosis: Stiffness of left shoulder, not elsewhere classified  Other symptoms and signs involving the musculoskeletal system  Acute pain of left shoulder    Problem List Patient Active Problem List   Diagnosis Date Noted  . S/P rotator cuff repair 02/10/2014  . Pain in joint, shoulder region 02/10/2014  . Muscle weakness (generalized) 02/10/2014  . Decreased range of motion of right shoulder 02/10/2014   Ailene Ravel, OTR/L,CBIS  [phone]  08/13/2017, 12:35 PM  [place] [address] [place], [place], [address] Phone: [phone]   Fax:  [phone]  Name: Marcus Chen MRN: [id number] Date of Birth: 1965-12-03

## 2017-08-15 ENCOUNTER — Ambulatory Visit (HOSPITAL_COMMUNITY): Payer: PRIVATE HEALTH INSURANCE | Admitting: Occupational Therapy

## 2017-08-15 ENCOUNTER — Encounter (HOSPITAL_COMMUNITY): Payer: Self-pay | Admitting: Occupational Therapy

## 2017-08-15 DIAGNOSIS — R29898 Other symptoms and signs involving the musculoskeletal system: Secondary | ICD-10-CM

## 2017-08-15 DIAGNOSIS — M25512 Pain in left shoulder: Secondary | ICD-10-CM

## 2017-08-15 DIAGNOSIS — M25612 Stiffness of left shoulder, not elsewhere classified: Secondary | ICD-10-CM

## 2017-08-15 NOTE — Therapy (Signed)
[place], [place], [address] Phone: [phone]   Fax:  [phone]  Occupational Therapy Treatment  Patient Details  Name: Marcus Chen MRN: [id number] Date of Birth: 12-29-1965 Referring Provider: Vickey Huger MD   Encounter Date: 08/15/2017  OT End of Session - 08/15/17 1421    Visit Number  22    Number of Visits  23    Date for OT Re-Evaluation  09/12/17    Authorization Type  [employer]    OT Start Time  1031    OT Stop Time  1115    OT Time Calculation (min)  44 min    Activity Tolerance  Patient tolerated treatment well    Behavior During Therapy  [facility] for tasks assessed/performed       Past Medical History:  Diagnosis Date  . AC (acromioclavicular) joint bone spurs    spurs  . Allergy   . Asthma   . Gout   . Kidney stone   . Post-operative nausea and vomiting   . Shoulder pain, bilateral     Past Surgical History:  Procedure Laterality Date  . APPENDECTOMY    . ROTATOR CUFF REPAIR  2014   right/ left shoulder was done 2010  . stomach reduction  2003  . TONSILLECTOMY      There were no vitals filed for this visit.  Subjective Assessment - 08/15/17 1028    Subjective   S: The doctor said I can start lifting overhead.     Currently in Pain?  No/denies         [facility] OT Assessment - 08/15/17 1028      Assessment   Medical Diagnosis  left RTC repair, SAD, SA      Precautions   Precautions  Shoulder    Type of Shoulder Precautions  Pt reports MD has released him from lifting restriction-progress as tolerated               OT Treatments/Exercises (OP) - 08/15/17 1030      Exercises   Exercises  Shoulder                                   Shoulder Exercises: Standing   Other Standing Exercises  Shoulder press; 10X; red band    Other Standing Exercises  Pt placed and removed small box with 10# inside on top of refridgerator.       Shoulder Exercises:  ROM/Strengthening   Other ROM/Strengthening Exercises  Large green therapy; 15X; chest press, flexion, PNF diagonals, circles    Other ROM/Strengthening Exercises  ball drops abduction, 1'; arms on fire 2\' 30"  total, 30" each position      Shoulder Exercises: Stretch   Cross Chest Stretch  3 reps;10 seconds    Internal Rotation Stretch  3 reps 10"    External Rotation Stretch  3 reps;10 seconds    Wall Stretch - Flexion  3 reps;10 seconds    Wall Stretch - ABduction  3 reps;10 seconds      Manual Therapy   Manual Therapy  Myofascial release    Manual therapy comments  Manual therapy completed prior to exercises.    Myofascial Release  Myofascial release and manual stretching completed to left upper arm, trapezius, and scapularis region to decrease fascial restrictions and increase joint mobility in a pain free zone.  OT Short Term Goals - 08/13/17 1152      OT SHORT TERM GOAL #1   Title  Patient will be educated and independent with HEP to increase functional performance during daily tasks using LUE.    Time  4    Period  Weeks      OT SHORT TERM GOAL #2   Title  patient will increase A/ROM to [facility] to increase ability to get dressed with less difficulty.    Time  4    Period  Weeks      OT SHORT TERM GOAL #3   Title  Patient will decrease fascial restrictions to trace amount or less to increase functional mobility in LUE needed for daily tasks.     Time  4    Period  Weeks    Status  On-going      OT SHORT TERM GOAL #4   Title  Patient will increase LUE strength to 3+/5 to increase ability to complete tasks at shoulder height with less difficulty.     Time  4    Period  Weeks    Status  Achieved      OT SHORT TERM GOAL #5   Title  Patient will decrease pain level with shoulder movements and daily tasks to 4/10 or less.     Time  4    Period  Weeks    Status  Achieved        OT Long Term Goals - 08/13/17 1152      OT LONG TERM GOAL #1   Title   Patient will return to highest level of independence with all daily tasks while able to return to work using his LUE as his non-dominant.     Time  8    Period  Weeks    Status  On-going      OT LONG TERM GOAL #2   Title  Patient will report a decreased pain level during daily and work related tasks of 2/10 or less.     Time  8    Period  Weeks    Status  On-going      OT LONG TERM GOAL #3   Title  Patient will increase A/ROM of LUE to WNL to increase ability to complete tasks above shoulder level as needed.     Time  8    Period  Weeks    Status  Achieved      OT LONG TERM GOAL #4   Title  Patient will increase LUE strength to 4+/5 in order to be able to return to work and complete required tasks with less difficulty.     Time  8    Period  Weeks    Status  On-going            Plan - 08/15/17 1421    Clinical Impression Statement  A: Continued with strengthening today, pt reports MD has released him for overhead lifting. Pt with trigger points at anterior shoulder and along medial border of scapula. Pt was unable to tolerate arms on fire for entire 3\' 30" , completing in 2\' 30" . Added overhead lifting/reaching activity, pt completing with minimal difficulty. Verbal cuing for form and technique during session.     Plan  P: continue with functional overhead reaching/strengthening       Patient will benefit from skilled therapeutic intervention in order to improve the following deficits and impairments:  Pain, Impaired UE functional use, Increased fascial restrictions, Decreased  range of motion  Visit Diagnosis: Stiffness of left shoulder, not elsewhere classified  Other symptoms and signs involving the musculoskeletal system  Acute pain of left shoulder    Problem List Patient Active Problem List   Diagnosis Date Noted  . S/P rotator cuff repair 02/10/2014  . Pain in joint, shoulder region 02/10/2014  . Muscle weakness (generalized) 02/10/2014  . Decreased range of  motion of right shoulder 02/10/2014   Guadelupe Chen, OTR/L  [phone] 08/15/2017, 2:25 PM  [place], [place], [address] Phone: [phone]   Fax:  [phone]  Name: Marcus Chen MRN: [id number] Date of Birth: November 28, 1965

## 2017-08-17 ENCOUNTER — Other Ambulatory Visit: Payer: Self-pay

## 2017-08-17 ENCOUNTER — Ambulatory Visit (HOSPITAL_COMMUNITY): Payer: PRIVATE HEALTH INSURANCE

## 2017-08-17 ENCOUNTER — Encounter (HOSPITAL_COMMUNITY): Payer: Self-pay

## 2017-08-17 DIAGNOSIS — M25612 Stiffness of left shoulder, not elsewhere classified: Secondary | ICD-10-CM

## 2017-08-17 DIAGNOSIS — R29898 Other symptoms and signs involving the musculoskeletal system: Secondary | ICD-10-CM

## 2017-08-17 DIAGNOSIS — M25512 Pain in left shoulder: Secondary | ICD-10-CM

## 2017-08-17 NOTE — Therapy (Signed)
[place], [place], [address] Phone: [phone]   Fax:  [phone]  Occupational Therapy Treatment  Patient Details  Name: Marcus Chen MRN: [id number] Date of Birth: [birth year] Referring Provider: Vickey Huger MD   Encounter Date: 08/17/2017  OT End of Session - 08/17/17 1114    Visit Number  23    Number of Visits  32    Date for OT Re-Evaluation  09/12/17    Authorization Type  [employer]    OT Start Time  1033    OT Stop Time  1115    OT Time Calculation (min)  42 min    Activity Tolerance  Patient tolerated treatment well    Behavior During Therapy  [facility] for tasks assessed/performed       Past Medical History:  Diagnosis Date  . AC (acromioclavicular) joint bone spurs    spurs  . Allergy   . Asthma   . Gout   . Kidney stone   . Post-operative nausea and vomiting   . Shoulder pain, bilateral     Past Surgical History:  Procedure Laterality Date  . APPENDECTOMY    . ROTATOR CUFF REPAIR  2014   right/ left shoulder was done 2010  . stomach reduction  2003  . TONSILLECTOMY      There were no vitals filed for this visit.  Subjective Assessment - 08/17/17 1124    Subjective   S: I didn't do any exercises yeterday. Instead I just stretched and use heat and it feels a whole lot better.    Currently in Pain?  No/denies         [facility] OT Assessment - 08/17/17 1055      Assessment   Medical Diagnosis  left RTC repair, SAD, SA      Precautions   Precautions  Shoulder    Type of Shoulder Precautions  Pt reports MD has released him from lifting restriction-progress as tolerated               OT Treatments/Exercises (OP) - 08/17/17 1055      Exercises   Exercises  Shoulder      Shoulder Exercises: Supine   Protraction  PROM;5 reps    Horizontal ABduction  PROM;5 reps    External Rotation  PROM;5 reps    Internal Rotation  PROM;5 reps    Flexion  PROM;5 reps    ABduction  PROM;5  reps      Shoulder Exercises: Standing   Protraction  Theraband;15 reps    Theraband Level (Shoulder Protraction)  Level 2 (Red)    Horizontal ABduction  Theraband;10 reps    Theraband Level (Shoulder Horizontal ABduction)  Level 4 (Blue)    Flexion  Theraband;10 reps    Theraband Level (Shoulder Flexion)  Level 3 (Green)    Retraction  Theraband;15 reps    Theraband Level (Shoulder Retraction)  Level 2 (Red)    Other Standing Exercises  Shoulder press; 12X; red band    Other Standing Exercises  Scaption; red band; 12X;       Shoulder Exercises: ROM/Strengthening   Cybex Press  15 reps 4.5 plate    Cybex Row  15 reps 5 plate    Pushups  -- 3 reps countertop    Ball on Wall  1'30" flexion 1'30" abduction       Manual Therapy   Manual Therapy  Myofascial release    Manual therapy comments  Manual  therapy completed prior to exercises.    Myofascial Release  Myofascial release and manual stretching completed to left upper arm, trapezius, and scapularis region to decrease fascial restrictions and increase joint mobility in a pain free zone.                OT Short Term Goals - 08/13/17 1152      OT SHORT TERM GOAL #1   Title  Patient will be educated and independent with HEP to increase functional performance during daily tasks using LUE.    Time  4    Period  Weeks      OT SHORT TERM GOAL #2   Title  patient will increase A/ROM to [facility] to increase ability to get dressed with less difficulty.    Time  4    Period  Weeks      OT SHORT TERM GOAL #3   Title  Patient will decrease fascial restrictions to trace amount or less to increase functional mobility in LUE needed for daily tasks.     Time  4    Period  Weeks    Status  On-going      OT SHORT TERM GOAL #4   Title  Patient will increase LUE strength to 3+/5 to increase ability to complete tasks at shoulder height with less difficulty.     Time  4    Period  Weeks    Status  Achieved      OT SHORT TERM GOAL #5    Title  Patient will decrease pain level with shoulder movements and daily tasks to 4/10 or less.     Time  4    Period  Weeks    Status  Achieved        OT Long Term Goals - 08/13/17 1152      OT LONG TERM GOAL #1   Title  Patient will return to highest level of independence with all daily tasks while able to return to work using his LUE as his non-dominant.     Time  8    Period  Weeks    Status  On-going      OT LONG TERM GOAL #2   Title  Patient will report a decreased pain level during daily and work related tasks of 2/10 or less.     Time  8    Period  Weeks    Status  On-going      OT LONG TERM GOAL #3   Title  Patient will increase A/ROM of LUE to WNL to increase ability to complete tasks above shoulder level as needed.     Time  8    Period  Weeks    Status  Achieved      OT LONG TERM GOAL #4   Title  Patient will increase LUE strength to 4+/5 in order to be able to return to work and complete required tasks with less difficulty.     Time  8    Period  Weeks    Status  On-going            Plan - 08/17/17 1124    Clinical Impression Statement  A: Patient shows weakness with anterior shoulder strength when completing protraction/pushing motions. Greater strength present with posterior shoulder movements. pt was able to complete Arms on [employer] for 4 minutes with few rest breaks. VC for form and technique as needed.     Plan  P: Continue with progressing  as tolerated with strengthening. Try to incorporate movements that he would need to complete at work. Patient reports that he will still be on light duty for 4 more weeks.        Patient will benefit from skilled therapeutic intervention in order to improve the following deficits and impairments:  Pain, Impaired UE functional use, Increased fascial restrictions, Decreased range of motion  Visit Diagnosis: Stiffness of left shoulder, not elsewhere classified  Other symptoms and signs involving the musculoskeletal  system  Acute pain of left shoulder    Problem List Patient Active Problem List   Diagnosis Date Noted  . S/P rotator cuff repair 02/10/2014  . Pain in joint, shoulder region 02/10/2014  . Muscle weakness (generalized) 02/10/2014  . Decreased range of motion of right shoulder 02/10/2014   Ailene Ravel, OTR/L,CBIS  [phone]  08/17/2017, 12:12 PM  [place], [place], [address] Phone: [phone]   Fax:  [phone]  Name: Marcus Chen MRN: [id number] Date of Birth: 1966/01/21

## 2017-08-20 ENCOUNTER — Ambulatory Visit (HOSPITAL_COMMUNITY): Payer: PRIVATE HEALTH INSURANCE | Admitting: Specialist

## 2017-08-20 ENCOUNTER — Encounter (HOSPITAL_COMMUNITY): Payer: Self-pay | Admitting: Specialist

## 2017-08-20 DIAGNOSIS — M25512 Pain in left shoulder: Secondary | ICD-10-CM

## 2017-08-20 DIAGNOSIS — M25612 Stiffness of left shoulder, not elsewhere classified: Secondary | ICD-10-CM

## 2017-08-20 DIAGNOSIS — R29898 Other symptoms and signs involving the musculoskeletal system: Secondary | ICD-10-CM

## 2017-08-20 NOTE — Therapy (Signed)
[place], [place], [address] Phone: [phone]   Fax:  [phone]  Occupational Therapy Treatment  Patient Details  Name: Marcus Chen MRN: [id number] Date of Birth: [birth year] Referring Provider: Vickey Huger MD   Encounter Date: 08/20/2017  OT End of Session - 08/20/17 2211    Visit Number  24    Number of Visits  32    Date for OT Re-Evaluation  09/12/17    Authorization Type  [employer]    OT Start Time  1301    OT Stop Time  1340    OT Time Calculation (min)  39 min    Activity Tolerance  Patient tolerated treatment well    Behavior During Therapy  [facility] for tasks assessed/performed       Past Medical History:  Diagnosis Date  . AC (acromioclavicular) joint bone spurs    spurs  . Allergy   . Asthma   . Gout   . Kidney stone   . Post-operative nausea and vomiting   . Shoulder pain, bilateral     Past Surgical History:  Procedure Laterality Date  . APPENDECTOMY    . ROTATOR CUFF REPAIR  2014   right/ left shoulder was done 2010  . stomach reduction  2003  . TONSILLECTOMY      There were no vitals filed for this visit.  Subjective Assessment - 08/20/17 1304    Subjective   S:  I can lift over my head now.     Currently in Pain?  No/denies    Pain Radiating Towards  just tightness          [facility] OT Assessment - 08/20/17 0001      Assessment   Medical Diagnosis  left RTC repair, SAD, SA      Precautions   Precautions  Shoulder    Type of Shoulder Precautions  Pt reports MD has released him from lifting restriction-progress as tolerated               OT Treatments/Exercises (OP) - 08/20/17 0001      Exercises   Exercises  Shoulder      Shoulder Exercises: ROM/Strengthening   Sustained Retraction with Theraband  through full range of flexion 5 times, overhead press 10 times with green theraband     Other ROM/Strengthening Exercises  with 4#:  arnold press 10 X, overhead  press 10X, leaning anterior deltoid fly with rotation 10 X, external rotation at side into abduction 10 X, pulses flexion at 90, 120, 150, 180 abduction pulses at 90, 120, 180.        Shoulder Exercises: Body Blade   Flexion  5 reps    ABduction  5 reps    ABduction Limitations  unable to maintain rhythm above 90 degrees     External Rotation  5 reps      Manual Therapy   Manual Therapy  Myofascial release    Manual therapy comments  Manual therapy completed prior to exercises.    Myofascial Release  Myofascial release and manual stretching completed to left upper arm, trapezius, and scapularis region to decrease fascial restrictions and increase joint mobility in a pain free zone.                OT Short Term Goals - 08/13/17 1152      OT SHORT TERM GOAL #1   Title  Patient will be educated and independent with HEP to increase  functional performance during daily tasks using LUE.    Time  4    Period  Weeks      OT SHORT TERM GOAL #2   Title  patient will increase A/ROM to [facility] to increase ability to get dressed with less difficulty.    Time  4    Period  Weeks      OT SHORT TERM GOAL #3   Title  Patient will decrease fascial restrictions to trace amount or less to increase functional mobility in LUE needed for daily tasks.     Time  4    Period  Weeks    Status  On-going      OT SHORT TERM GOAL #4   Title  Patient will increase LUE strength to 3+/5 to increase ability to complete tasks at shoulder height with less difficulty.     Time  4    Period  Weeks    Status  Achieved      OT SHORT TERM GOAL #5   Title  Patient will decrease pain level with shoulder movements and daily tasks to 4/10 or less.     Time  4    Period  Weeks    Status  Achieved        OT Long Term Goals - 08/13/17 1152      OT LONG TERM GOAL #1   Title  Patient will return to highest level of independence with all daily tasks while able to return to work using his LUE as his non-dominant.      Time  8    Period  Weeks    Status  On-going      OT LONG TERM GOAL #2   Title  Patient will report a decreased pain level during daily and work related tasks of 2/10 or less.     Time  8    Period  Weeks    Status  On-going      OT LONG TERM GOAL #3   Title  Patient will increase A/ROM of LUE to WNL to increase ability to complete tasks above shoulder level as needed.     Time  8    Period  Weeks    Status  Achieved      OT LONG TERM GOAL #4   Title  Patient will increase LUE strength to 4+/5 in order to be able to return to work and complete required tasks with less difficulty.     Time  8    Period  Weeks    Status  On-going            Plan - 08/20/17 2211    Clinical Impression Statement  A:  Treatment focused on proximal shoulder strenthening and anterior shoulder strengthening for improved ease and independence with overhead lifting in prep to return to full duties at work.  Strengthening activiites at mid range are most difficult and painful.  Moderate difficulty with bodyblade abduction and anterior deltoid fly with rotation.     Plan  P:  Improve form and independence with overhead strengthening activities, improve ease with body blade abduction.  Attempt dead lift row.         Patient will benefit from skilled therapeutic intervention in order to improve the following deficits and impairments:  Pain, Impaired UE functional use, Increased fascial restrictions, Decreased range of motion  Visit Diagnosis: Stiffness of left shoulder, not elsewhere classified  Other symptoms and signs involving the musculoskeletal system  Acute pain of left shoulder    Problem List Patient Active Problem List   Diagnosis Date Noted  . S/P rotator cuff repair 02/10/2014  . Pain in joint, shoulder region 02/10/2014  . Muscle weakness (generalized) 02/10/2014  . Decreased range of motion of right shoulder 02/10/2014    Vangie Bicker, [place],  OTR/L [phone]  08/20/2017, 10:19 PM  [place] [address] [place], [place], [address] Phone: [phone]   Fax:  [phone]  Name: Marcus Chen MRN: [id number] Date of Birth: 04-25-1966

## 2017-08-22 ENCOUNTER — Ambulatory Visit (HOSPITAL_COMMUNITY): Payer: PRIVATE HEALTH INSURANCE

## 2017-08-22 ENCOUNTER — Telehealth (HOSPITAL_COMMUNITY): Payer: Self-pay | Admitting: Internal Medicine

## 2017-08-22 NOTE — Telephone Encounter (Signed)
08/22/17  said he wasn't feeling too good this morning

## 2017-08-24 ENCOUNTER — Ambulatory Visit (HOSPITAL_COMMUNITY): Payer: PRIVATE HEALTH INSURANCE | Admitting: Occupational Therapy

## 2017-08-24 ENCOUNTER — Encounter (HOSPITAL_COMMUNITY): Payer: Self-pay | Admitting: Occupational Therapy

## 2017-08-24 DIAGNOSIS — M25612 Stiffness of left shoulder, not elsewhere classified: Secondary | ICD-10-CM

## 2017-08-24 DIAGNOSIS — R29898 Other symptoms and signs involving the musculoskeletal system: Secondary | ICD-10-CM

## 2017-08-24 DIAGNOSIS — M25512 Pain in left shoulder: Secondary | ICD-10-CM

## 2017-08-24 NOTE — Therapy (Signed)
[place], [place], [address] Phone: [phone]   Fax:  [phone]  Occupational Therapy Treatment  Patient Details  Name: Marcus Chen MRN: [id number] Date of Birth: 1966/02/08 Referring Provider: Vickey Huger MD   Encounter Date: 08/24/2017  OT End of Session - 08/24/17 1649    Visit Number  25    Number of Visits  32    Date for OT Re-Evaluation  09/12/17    Authorization Type  [employer]    OT Start Time  1517    OT Stop Time  1601    OT Time Calculation (min)  44 min    Activity Tolerance  Patient tolerated treatment well    Behavior During Therapy  [facility] for tasks assessed/performed       Past Medical History:  Diagnosis Date  . AC (acromioclavicular) joint bone spurs    spurs  . Allergy   . Asthma   . Gout   . Kidney stone   . Post-operative nausea and vomiting   . Shoulder pain, bilateral     Past Surgical History:  Procedure Laterality Date  . APPENDECTOMY    . ROTATOR CUFF REPAIR  2014   right/ left shoulder was done 2010  . stomach reduction  2003  . TONSILLECTOMY      There were no vitals filed for this visit.  Subjective Assessment - 08/24/17 1516    Subjective   S: I can tell I've turned a corner.     Currently in Pain?  No/denies         [facility] OT Assessment - 08/24/17 1515      Assessment   Medical Diagnosis  left RTC repair, SAD, SA      Precautions   Precautions  Shoulder    Type of Shoulder Precautions  Pt reports MD has released him from lifting restriction-progress as tolerated               OT Treatments/Exercises (OP) - 08/24/17 1521      Exercises   Exercises  Shoulder      Shoulder Exercises: Prone   Other Prone Exercises  H1, H3, H4, 10X    Other Prone Exercises  deadlift row, 8#, 10X      Shoulder Exercises: Standing   Other Standing Exercises  ball drops, abduction 1'     Other Standing Exercises  external rotation with shoulder at 90 degrees  flexion with red band,10X, OT lightly stabilizing at elbow; shoulder press 10X red band      Shoulder Exercises: ROM/Strengthening   Cybex Press  15 reps 5#    Cybex Row  15 reps 5#    Pushups  -- 9 reps, 1 rest break; at countertop    Other ROM/Strengthening Exercises  with 4#:  arnold press 10 X, overhead press 10X, leaning anterior deltoid fly with rotation 10 X    Other ROM/Strengthening Exercises  overhead carry, 5#, 2'       Shoulder Exercises: Body Blade   Flexion  5 reps    ABduction  5 reps    External Rotation  5 reps    Internal Rotation  5 reps      Manual Therapy   Manual Therapy  Myofascial release    Manual therapy comments  Manual therapy completed prior to exercises.    Myofascial Release  Myofascial release and manual stretching completed to left upper arm, trapezius, and scapularis region to decrease fascial  restrictions and increase joint mobility in a pain free zone.                OT Short Term Goals - 08/13/17 1152      OT SHORT TERM GOAL #1   Title  Patient will be educated and independent with HEP to increase functional performance during daily tasks using LUE.    Time  4    Period  Weeks      OT SHORT TERM GOAL #2   Title  patient will increase A/ROM to [facility] to increase ability to get dressed with less difficulty.    Time  4    Period  Weeks      OT SHORT TERM GOAL #3   Title  Patient will decrease fascial restrictions to trace amount or less to increase functional mobility in LUE needed for daily tasks.     Time  4    Period  Weeks    Status  On-going      OT SHORT TERM GOAL #4   Title  Patient will increase LUE strength to 3+/5 to increase ability to complete tasks at shoulder height with less difficulty.     Time  4    Period  Weeks    Status  Achieved      OT SHORT TERM GOAL #5   Title  Patient will decrease pain level with shoulder movements and daily tasks to 4/10 or less.     Time  4    Period  Weeks    Status  Achieved         OT Long Term Goals - 08/13/17 1152      OT LONG TERM GOAL #1   Title  Patient will return to highest level of independence with all daily tasks while able to return to work using his LUE as his non-dominant.     Time  8    Period  Weeks    Status  On-going      OT LONG TERM GOAL #2   Title  Patient will report a decreased pain level during daily and work related tasks of 2/10 or less.     Time  8    Period  Weeks    Status  On-going      OT LONG TERM GOAL #3   Title  Patient will increase A/ROM of LUE to WNL to increase ability to complete tasks above shoulder level as needed.     Time  8    Period  Weeks    Status  Achieved      OT LONG TERM GOAL #4   Title  Patient will increase LUE strength to 4+/5 in order to be able to return to work and complete required tasks with less difficulty.     Time  8    Period  Weeks    Status  On-going            Plan - 08/24/17 1649    Clinical Impression Statement  A: Session focusing on shoulder strengthening and stabilization, added er with band with shoulder at 90 degrees-mod difficulty, also added overhead carry and deadlift row. Pt requiring verbal cuing for form and technique, occasional rest breaks.     Plan  P: Continue with shoulder strengthening, focusing on mid-range to overhead lifting. Continue with body blade and increase reps       Patient will benefit from skilled therapeutic intervention in order to improve the following  deficits and impairments:  Pain, Impaired UE functional use, Increased fascial restrictions, Decreased range of motion  Visit Diagnosis: Stiffness of left shoulder, not elsewhere classified  Other symptoms and signs involving the musculoskeletal system  Acute pain of left shoulder    Problem List Patient Active Problem List   Diagnosis Date Noted  . S/P rotator cuff repair 02/10/2014  . Pain in joint, shoulder region 02/10/2014  . Muscle weakness (generalized) 02/10/2014  . Decreased  range of motion of right shoulder 02/10/2014   [name], OTR/L  [phone] 08/24/2017, 4:51 PM  [place] [address] [place], [place], [address] Phone: [phone]   Fax:  [phone]  Name: Marcus Chen MRN: [id number] Date of Birth: Nov 20, 1965

## 2017-08-27 ENCOUNTER — Encounter (HOSPITAL_COMMUNITY): Payer: PRIVATE HEALTH INSURANCE

## 2017-08-28 ENCOUNTER — Encounter (HOSPITAL_COMMUNITY): Payer: Self-pay | Admitting: Occupational Therapy

## 2017-08-28 ENCOUNTER — Ambulatory Visit (HOSPITAL_COMMUNITY): Payer: PRIVATE HEALTH INSURANCE | Attending: Orthopedic Surgery | Admitting: Occupational Therapy

## 2017-08-28 DIAGNOSIS — M62838 Other muscle spasm: Secondary | ICD-10-CM | POA: Diagnosis present

## 2017-08-28 DIAGNOSIS — R29898 Other symptoms and signs involving the musculoskeletal system: Secondary | ICD-10-CM | POA: Diagnosis present

## 2017-08-28 DIAGNOSIS — M6281 Muscle weakness (generalized): Secondary | ICD-10-CM | POA: Diagnosis present

## 2017-08-28 DIAGNOSIS — M25612 Stiffness of left shoulder, not elsewhere classified: Secondary | ICD-10-CM | POA: Diagnosis present

## 2017-08-28 DIAGNOSIS — M25512 Pain in left shoulder: Secondary | ICD-10-CM | POA: Insufficient documentation

## 2017-08-28 DIAGNOSIS — G8929 Other chronic pain: Secondary | ICD-10-CM | POA: Diagnosis present

## 2017-08-28 NOTE — Therapy (Signed)
[place], [place], [address] Phone: [phone]   Fax:  [phone]  Occupational Therapy Treatment  Patient Details  Name: Marcus Chen MRN: [id number] Date of Birth: Jun 21, 1965 Referring Provider: Vickey Huger MD   Encounter Date: 08/28/2017  OT End of Session - 08/28/17 1744    Visit Number  26    Number of Visits  32    Date for OT Re-Evaluation  09/12/17    Authorization Type  [employer]    OT Start Time  1647    OT Stop Time  1731    OT Time Calculation (min)  44 min    Activity Tolerance  Patient tolerated treatment well    Behavior During Therapy  [facility] for tasks assessed/performed       Past Medical History:  Diagnosis Date  . AC (acromioclavicular) joint bone spurs    spurs  . Allergy   . Asthma   . Gout   . Kidney stone   . Post-operative nausea and vomiting   . Shoulder pain, bilateral     Past Surgical History:  Procedure Laterality Date  . APPENDECTOMY    . ROTATOR CUFF REPAIR  2014   right/ left shoulder was done 2010  . stomach reduction  2003  . TONSILLECTOMY      There were no vitals filed for this visit.  Subjective Assessment - 08/28/17 1646    Subjective   S: I've had some spasms over the weekend.     Currently in Pain?  No/denies         [facility] OT Assessment - 08/28/17 1646      Assessment   Medical Diagnosis  left RTC repair, SAD, SA      Precautions   Precautions  Shoulder    Type of Shoulder Precautions  Pt reports MD has released him from lifting restriction-progress as tolerated               OT Treatments/Exercises (OP) - 08/28/17 1649      Exercises   Exercises  Shoulder      Shoulder Exercises: Prone   Other Prone Exercises  H1, H3, H4, 10X, 1#    Other Prone Exercises  prone ball drops, 1'; deadlift row, 8#, 15X      Shoulder Exercises: Standing   Flexion  Theraband;10 reps    Theraband Level (Shoulder Flexion)  Level 3 (Green)    ABduction   Theraband;10 reps    Theraband Level (Shoulder ABduction)  Level 3 (Green)    Other Standing Exercises  external rotation with shoulder at 90 degrees flexion with red band,10X, OT lightly stabilizing at elbow; shoulder press 10X green band, 5X red band no stabilization from OT      Shoulder Exercises: ROM/Strengthening   Pushups  -- 12 reps no break    Other ROM/Strengthening Exercises  with 4#:  arnold press 15X; tricep extension 15X with 8#    Other ROM/Strengthening Exercises  overhead carry, 5#, 2' ; alternating shoulder taps at countertop, 15X      Shoulder Exercises: Body Blade   Flexion  5 reps 30" hold at end    ABduction  5 reps 20" hold at end    External Rotation  5 reps 15" hold at end    Internal Rotation  5 reps      Manual Therapy   Manual Therapy  Myofascial release    Manual therapy comments  Manual therapy completed  prior to exercises.    Myofascial Release  Myofascial release and manual stretching completed to left upper arm, trapezius, and scapularis region to decrease fascial restrictions and increase joint mobility in a pain free zone.                OT Short Term Goals - 08/13/17 1152      OT SHORT TERM GOAL #1   Title  Patient will be educated and independent with HEP to increase functional performance during daily tasks using LUE.    Time  4    Period  Weeks      OT SHORT TERM GOAL #2   Title  patient will increase A/ROM to [facility] to increase ability to get dressed with less difficulty.    Time  4    Period  Weeks      OT SHORT TERM GOAL #3   Title  Patient will decrease fascial restrictions to trace amount or less to increase functional mobility in LUE needed for daily tasks.     Time  4    Period  Weeks    Status  On-going      OT SHORT TERM GOAL #4   Title  Patient will increase LUE strength to 3+/5 to increase ability to complete tasks at shoulder height with less difficulty.     Time  4    Period  Weeks    Status  Achieved      OT  SHORT TERM GOAL #5   Title  Patient will decrease pain level with shoulder movements and daily tasks to 4/10 or less.     Time  4    Period  Weeks    Status  Achieved        OT Long Term Goals - 08/13/17 1152      OT LONG TERM GOAL #1   Title  Patient will return to highest level of independence with all daily tasks while able to return to work using his LUE as his non-dominant.     Time  8    Period  Weeks    Status  On-going      OT LONG TERM GOAL #2   Title  Patient will report a decreased pain level during daily and work related tasks of 2/10 or less.     Time  8    Period  Weeks    Status  On-going      OT LONG TERM GOAL #3   Title  Patient will increase A/ROM of LUE to WNL to increase ability to complete tasks above shoulder level as needed.     Time  8    Period  Weeks    Status  Achieved      OT LONG TERM GOAL #4   Title  Patient will increase LUE strength to 4+/5 in order to be able to return to work and complete required tasks with less difficulty.     Time  8    Period  Weeks    Status  On-going            Plan - 08/28/17 1745    Clinical Impression Statement  A: Session focusign on shoulder strengthening and stabilization, especially from mid-range to end-range as this is where pt's stability is weakest. Pt with improved tolerance and form with overhead carry and er with band. Added sustained hold to end of body blade reps, mod-max difficulty. Verbal cuing for form and technique throughout session.  Plan  P: Continue with body blade susatined hold, overhead tasks with weights    Consulted and Agree with Plan of Care  Patient       Patient will benefit from skilled therapeutic intervention in order to improve the following deficits and impairments:  Pain, Impaired UE functional use, Increased fascial restrictions, Decreased range of motion  Visit Diagnosis: Stiffness of left shoulder, not elsewhere classified  Other symptoms and signs involving the  musculoskeletal system  Acute pain of left shoulder    Problem List Patient Active Problem List   Diagnosis Date Noted  . S/P rotator cuff repair 02/10/2014  . Pain in joint, shoulder region 02/10/2014  . Muscle weakness (generalized) 02/10/2014  . Decreased range of motion of right shoulder 02/10/2014   [name], OTR/L  [phone] 08/28/2017, 5:47 PM  [facility] [address] [place], [place], [address] Phone: [phone]   Fax:  [phone]  Name: Marcus Chen MRN: [id number] Date of Birth: 1965-11-15

## 2017-08-29 ENCOUNTER — Encounter (HOSPITAL_COMMUNITY): Payer: PRIVATE HEALTH INSURANCE

## 2017-08-30 ENCOUNTER — Encounter (HOSPITAL_COMMUNITY): Payer: PRIVATE HEALTH INSURANCE | Admitting: Occupational Therapy

## 2017-08-31 ENCOUNTER — Ambulatory Visit (HOSPITAL_COMMUNITY): Payer: PRIVATE HEALTH INSURANCE | Admitting: Occupational Therapy

## 2017-08-31 ENCOUNTER — Encounter (HOSPITAL_COMMUNITY): Payer: PRIVATE HEALTH INSURANCE | Admitting: Occupational Therapy

## 2017-08-31 ENCOUNTER — Encounter (HOSPITAL_COMMUNITY): Payer: Self-pay | Admitting: Occupational Therapy

## 2017-08-31 DIAGNOSIS — M25612 Stiffness of left shoulder, not elsewhere classified: Secondary | ICD-10-CM

## 2017-08-31 DIAGNOSIS — M25512 Pain in left shoulder: Secondary | ICD-10-CM

## 2017-08-31 DIAGNOSIS — R29898 Other symptoms and signs involving the musculoskeletal system: Secondary | ICD-10-CM

## 2017-08-31 NOTE — Therapy (Signed)
[place], [place], [address] Phone: [phone]   Fax:  [phone]  Occupational Therapy Treatment  Patient Details  Name: KOBE OFALLON MRN: [id number] Date of Birth: 09-20-1965 Referring Provider: Vickey Huger MD   Encounter Date: 08/31/2017  OT End of Session - 08/31/17 1608    Visit Number  27    Number of Visits  32    Date for OT Re-Evaluation  09/12/17    Authorization Type  [employer]    OT Start Time  1517    OT Stop Time  1602    OT Time Calculation (min)  45 min    Activity Tolerance  Patient tolerated treatment well    Behavior During Therapy  [facility] for tasks assessed/performed       Past Medical History:  Diagnosis Date  . AC (acromioclavicular) joint bone spurs    spurs  . Allergy   . Asthma   . Gout   . Kidney stone   . Post-operative nausea and vomiting   . Shoulder pain, bilateral     Past Surgical History:  Procedure Laterality Date  . APPENDECTOMY    . ROTATOR CUFF REPAIR  2014   right/ left shoulder was done 2010  . stomach reduction  2003  . TONSILLECTOMY      There were no vitals filed for this visit.  Subjective Assessment - 08/31/17 1517    Subjective   S: I had one spasm about an hour ago but that's it since the last session.     Currently in Pain?  No/denies         [facility] OT Assessment - 08/31/17 1516      Assessment   Medical Diagnosis  left RTC repair, SAD, SA      Precautions   Precautions  Shoulder    Type of Shoulder Precautions  Pt reports MD has released him from lifting restriction-progress as tolerated               OT Treatments/Exercises (OP) - 08/31/17 1519      Exercises   Exercises  Shoulder      Shoulder Exercises: Prone   Other Prone Exercises  H1, H3, H4, 12X, 2#    Other Prone Exercises  prone ball drops-flexion 40" and abduction, 1'; deadlift row, 8#, 15X      Shoulder Exercises: Standing   Protraction  Theraband;10 reps    Theraband Level (Shoulder Protraction)  Level 4 (Blue)    Flexion  Strengthening;10 reps;Theraband 7 reps    Theraband Level (Shoulder Flexion)  Level 4 (Blue)    Shoulder Flexion Weight (lbs)  5    ABduction  Strengthening;10 reps;Theraband;12 reps    Theraband Level (Shoulder ABduction)  Level 3 (Green)    Shoulder ABduction Weight (lbs)  5    Other Standing Exercises  external rotation with shoulder at 90 degrees flexion with red band,10X; shoulder press 10X green band      Shoulder Exercises: ROM/Strengthening   Pushups  -- 12 reps at low wall in peds room    Other ROM/Strengthening Exercises  with 5#:  arnold press 12X; tricep extension 15X with 8#    Other ROM/Strengthening Exercises  overhead carry, 5#, 2'; Pt completed weighted box lifting with 15#, lifting to top of refrigerator      Shoulder Exercises: Body Blade   Flexion  5 reps 30" hold at end    ABduction  5 reps  External Rotation  5 reps 30" hold at end    Internal Rotation  5 reps      Manual Therapy   Manual Therapy  Myofascial release    Manual therapy comments  Manual therapy completed prior to exercises.    Myofascial Release  Myofascial release and manual stretching completed to left upper arm, trapezius, and scapularis region to decrease fascial restrictions and increase joint mobility in a pain free zone.                OT Short Term Goals - 08/13/17 1152      OT SHORT TERM GOAL #1   Title  Patient will be educated and independent with HEP to increase functional performance during daily tasks using LUE.    Time  4    Period  Weeks      OT SHORT TERM GOAL #2   Title  patient will increase A/ROM to [facility] to increase ability to get dressed with less difficulty.    Time  4    Period  Weeks      OT SHORT TERM GOAL #3   Title  Patient will decrease fascial restrictions to trace amount or less to increase functional mobility in LUE needed for daily tasks.     Time  4    Period  Weeks    Status   On-going      OT SHORT TERM GOAL #4   Title  Patient will increase LUE strength to 3+/5 to increase ability to complete tasks at shoulder height with less difficulty.     Time  4    Period  Weeks    Status  Achieved      OT SHORT TERM GOAL #5   Title  Patient will decrease pain level with shoulder movements and daily tasks to 4/10 or less.     Time  4    Period  Weeks    Status  Achieved        OT Long Term Goals - 08/13/17 1152      OT LONG TERM GOAL #1   Title  Patient will return to highest level of independence with all daily tasks while able to return to work using his LUE as his non-dominant.     Time  8    Period  Weeks    Status  On-going      OT LONG TERM GOAL #2   Title  Patient will report a decreased pain level during daily and work related tasks of 2/10 or less.     Time  8    Period  Weeks    Status  On-going      OT LONG TERM GOAL #3   Title  Patient will increase A/ROM of LUE to WNL to increase ability to complete tasks above shoulder level as needed.     Time  8    Period  Weeks    Status  Achieved      OT LONG TERM GOAL #4   Title  Patient will increase LUE strength to 4+/5 in order to be able to return to work and complete required tasks with less difficulty.     Time  8    Period  Weeks    Status  On-going            Plan - 08/31/17 1608    Clinical Impression Statement  A: Session focusing on shoulder strengthening and stabilization and overhead lifting tasks.  Pt with improvement in push-ups lowering height of surface, also improved with theraband external rotation. Added prone flexion ball drops, increased weight for standing exercises. Pt did well with weighted box task. Verbal cuing during session for form and technique.     Plan  P: Continue with strengthening required for preparation to return to work       Patient will benefit from skilled therapeutic intervention in order to improve the following deficits and impairments:  Pain,  Impaired UE functional use, Increased fascial restrictions, Decreased range of motion  Visit Diagnosis: Stiffness of left shoulder, not elsewhere classified  Other symptoms and signs involving the musculoskeletal system  Acute pain of left shoulder    Problem List Patient Active Problem List   Diagnosis Date Noted  . S/P rotator cuff repair 02/10/2014  . Pain in joint, shoulder region 02/10/2014  . Muscle weakness (generalized) 02/10/2014  . Decreased range of motion of right shoulder 02/10/2014   [name], OTR/L  [phone] 08/31/2017, 4:14 PM  [place], [place], [address] Phone: [phone]   Fax:  [phone]  Name: HASKELL RIHN MRN: [id number] Date of Birth: 03/06/1966

## 2017-09-03 ENCOUNTER — Ambulatory Visit (HOSPITAL_COMMUNITY): Payer: PRIVATE HEALTH INSURANCE | Admitting: Occupational Therapy

## 2017-09-03 ENCOUNTER — Encounter (HOSPITAL_COMMUNITY): Payer: Self-pay | Admitting: Occupational Therapy

## 2017-09-03 DIAGNOSIS — M25612 Stiffness of left shoulder, not elsewhere classified: Secondary | ICD-10-CM

## 2017-09-03 DIAGNOSIS — R29898 Other symptoms and signs involving the musculoskeletal system: Secondary | ICD-10-CM

## 2017-09-03 DIAGNOSIS — M25512 Pain in left shoulder: Secondary | ICD-10-CM

## 2017-09-03 NOTE — Therapy (Signed)
[place], [place], [address] Phone: [phone]   Fax:  [phone]  Occupational Therapy Treatment  Patient Details  Name: Marcus Chen MRN: [id number] Date of Birth: 07/03/1965 Referring Provider: Vickey Huger MD   Encounter Date: 09/03/2017  OT End of Session - 09/03/17 1203    Visit Number  28    Number of Visits  32    Date for OT Re-Evaluation  09/12/17    Authorization Type  [employer]    OT Start Time  1118    OT Stop Time  1201    OT Time Calculation (min)  43 min    Activity Tolerance  Patient tolerated treatment well    Behavior During Therapy  [facility] for tasks assessed/performed       Past Medical History:  Diagnosis Date  . AC (acromioclavicular) joint bone spurs    spurs  . Allergy   . Asthma   . Gout   . Kidney stone   . Post-operative nausea and vomiting   . Shoulder pain, bilateral     Past Surgical History:  Procedure Laterality Date  . APPENDECTOMY    . ROTATOR CUFF REPAIR  2014   right/ left shoulder was done 2010  . stomach reduction  2003  . TONSILLECTOMY      There were no vitals filed for this visit.  Subjective Assessment - 09/03/17 1113    Subjective   S: It's been sore over the weekend.     Currently in Pain?  No/denies         [facility] OT Assessment - 09/03/17 1113      Assessment   Medical Diagnosis  left RTC repair, SAD, SA      Precautions   Precautions  Shoulder    Type of Shoulder Precautions  Pt reports MD has released him from lifting restriction-progress as tolerated               OT Treatments/Exercises (OP) - 09/03/17 1120      Exercises   Exercises  Shoulder      Shoulder Exercises: Prone   Other Prone Exercises  H1, H3, H4, 12X, 3#    Other Prone Exercises  prone ball drops-flexion 1' with 3 rest breaks       Shoulder Exercises: Standing   Flexion  Theraband;10 reps    Theraband Level (Shoulder Flexion)  Level 3 (Green)    ABduction   Theraband;10 reps    Theraband Level (Shoulder ABduction)  Level 3 (Green)    Other Standing Exercises  blue loop band: lateral wall walks at overhead level, diagonal wall walks at 120 degree range, 10X each    Other Standing Exercises  external rotation with shoulder at 90 degrees flexion with green band,10X      Shoulder Exercises: ROM/Strengthening   Cybex Press  15 reps 5#    Cybex Row  15 reps 5#    Pushups  -- 10 reps at low wall in peds room, 1 rest break    Other ROM/Strengthening Exercises  with 5#:  arnold press keeping shoulder at 90 degrees to initiate 10X; tricep extension with 10#-10X      Shoulder Exercises: Body Blade   Flexion  30 seconds;3 reps    ABduction  30 seconds;3 reps    External Rotation  30 seconds;3 reps      Manual Therapy   Manual Therapy  Myofascial release    Manual therapy comments  Manual therapy completed prior to exercises.    Myofascial Release  Myofascial release and manual stretching completed to left upper arm, trapezius, and scapularis region to decrease fascial restrictions and increase joint mobility in a pain free zone.                OT Short Term Goals - 08/13/17 1152      OT SHORT TERM GOAL #1   Title  Patient will be educated and independent with HEP to increase functional performance during daily tasks using LUE.    Time  4    Period  Weeks      OT SHORT TERM GOAL #2   Title  patient will increase A/ROM to [facility] to increase ability to get dressed with less difficulty.    Time  4    Period  Weeks      OT SHORT TERM GOAL #3   Title  Patient will decrease fascial restrictions to trace amount or less to increase functional mobility in LUE needed for daily tasks.     Time  4    Period  Weeks    Status  On-going      OT SHORT TERM GOAL #4   Title  Patient will increase LUE strength to 3+/5 to increase ability to complete tasks at shoulder height with less difficulty.     Time  4    Period  Weeks    Status  Achieved       OT SHORT TERM GOAL #5   Title  Patient will decrease pain level with shoulder movements and daily tasks to 4/10 or less.     Time  4    Period  Weeks    Status  Achieved        OT Long Term Goals - 08/13/17 1152      OT LONG TERM GOAL #1   Title  Patient will return to highest level of independence with all daily tasks while able to return to work using his LUE as his non-dominant.     Time  8    Period  Weeks    Status  On-going      OT LONG TERM GOAL #2   Title  Patient will report a decreased pain level during daily and work related tasks of 2/10 or less.     Time  8    Period  Weeks    Status  On-going      OT LONG TERM GOAL #3   Title  Patient will increase A/ROM of LUE to WNL to increase ability to complete tasks above shoulder level as needed.     Time  8    Period  Weeks    Status  Achieved      OT LONG TERM GOAL #4   Title  Patient will increase LUE strength to 4+/5 in order to be able to return to work and complete required tasks with less difficulty.     Time  8    Period  Weeks    Status  On-going            Plan - 09/03/17 1204    Clinical Impression Statement  A: Continued with strengthening today, increasing weight for prone exercises to 3#, increasing to blue band with scapular stabilization and working on overhead strengthening. Pt requiring occasional rest breaks this session, believes more difficult due to the weather, most discomfort at medial deltoid and upper arm. Verbal cuing during session  for form and technique.     Plan  P: Continue with overhead strengthening and working towards strength required for returning to work.        Patient will benefit from skilled therapeutic intervention in order to improve the following deficits and impairments:  Pain, Impaired UE functional use, Increased fascial restrictions, Decreased range of motion  Visit Diagnosis: Stiffness of left shoulder, not elsewhere classified  Other symptoms and signs  involving the musculoskeletal system  Acute pain of left shoulder    Problem List Patient Active Problem List   Diagnosis Date Noted  . S/P rotator cuff repair 02/10/2014  . Pain in joint, shoulder region 02/10/2014  . Muscle weakness (generalized) 02/10/2014  . Decreased range of motion of right shoulder 02/10/2014   [name], OTR/L  [phone] 09/03/2017, 12:06 PM  [facility] [address] [place], [place], [address] Phone: [phone]   Fax:  [phone]  Name: MALACKI MCPHEARSON MRN: [id number] Date of Birth: Aug 04, 1965

## 2017-09-05 ENCOUNTER — Ambulatory Visit (HOSPITAL_COMMUNITY): Payer: PRIVATE HEALTH INSURANCE

## 2017-09-05 ENCOUNTER — Other Ambulatory Visit: Payer: Self-pay

## 2017-09-05 DIAGNOSIS — R29898 Other symptoms and signs involving the musculoskeletal system: Secondary | ICD-10-CM

## 2017-09-05 DIAGNOSIS — M25512 Pain in left shoulder: Secondary | ICD-10-CM

## 2017-09-05 DIAGNOSIS — M25612 Stiffness of left shoulder, not elsewhere classified: Secondary | ICD-10-CM | POA: Diagnosis not present

## 2017-09-06 ENCOUNTER — Ambulatory Visit (HOSPITAL_COMMUNITY): Payer: PRIVATE HEALTH INSURANCE

## 2017-09-06 NOTE — Therapy (Signed)
[place], [place], [address] Phone: [phone]   Fax:  [phone]  Occupational Therapy Treatment  Patient Details  Name: Marcus Chen MRN: [id number] Date of Birth: 02-03-1966 Referring Provider: Vickey Huger MD   Encounter Date: 09/05/2017  OT End of Session - 09/06/17 0753    Visit Number  29    Number of Visits  32    Date for OT Re-Evaluation  09/12/17    Authorization Type  [employer]    OT Start Time  1519    OT Stop Time  1600    OT Time Calculation (min)  41 min    Activity Tolerance  Patient tolerated treatment well    Behavior During Therapy  [facility] for tasks assessed/performed       Past Medical History:  Diagnosis Date  . AC (acromioclavicular) joint bone spurs    spurs  . Allergy   . Asthma   . Gout   . Kidney stone   . Post-operative nausea and vomiting   . Shoulder pain, bilateral     Past Surgical History:  Procedure Laterality Date  . APPENDECTOMY    . ROTATOR CUFF REPAIR  2014   right/ left shoulder was done 2010  . stomach reduction  2003  . TONSILLECTOMY      There were no vitals filed for this visit.  Subjective Assessment - 09/05/17 1543    Subjective   S: I'm still having issues in the same spots.    Currently in Pain?  No/denies         [facility] OT Assessment - 09/05/17 1544      Assessment   Medical Diagnosis  left RTC repair, SAD, SA      Precautions   Precautions  Shoulder    Type of Shoulder Precautions  Pt reports MD has released him from lifting restriction-progress as tolerated               OT Treatments/Exercises (OP) - 09/05/17 1544      Exercises   Exercises  Shoulder      Shoulder Exercises: Prone   Other Prone Exercises  H1 (A/ROM) H4 (2#) 12X      Shoulder Exercises: Sidelying   Other Sidelying Exercises  Horzontal abduction; 12X; 3#    Other Sidelying Exercises  Protraction; 12X; 3#      Shoulder Exercises: ROM/Strengthening   Other ROM/Strengthening Exercises  Prone overhead press to increase scapular strength A/ROM 12X      Shoulder Exercises: Stretch   Other Shoulder Stretches  Pectoralis minor/major stretch on wall; 15 seconds; 2 times.       Manual Therapy   Manual Therapy  Myofascial release    Manual therapy comments  Manual therapy completed prior to exercises.    Joint Mobilization  Manual pectoralis minor stretch and pectoralis minor release completed to LUE to increase joint mobility and allow patient to utilize full ROM when completing exercises and functional reacihing tasks.     Myofascial Release  Myofascial release and manual stretching completed to left upper arm and  trapezius to decrease fascial restrictions and increase joint mobility in a pain free zone.                OT Short Term Goals - 09/06/17 0756      OT SHORT TERM GOAL #1   Title  Patient will be educated and independent with HEP to increase functional performance during daily tasks  using LUE.    Time  4    Period  Weeks      OT SHORT TERM GOAL #2   Title  patient will increase A/ROM to [facility] to increase ability to get dressed with less difficulty.    Time  4    Period  Weeks      OT SHORT TERM GOAL #3   Title  Patient will decrease fascial restrictions to trace amount or less to increase functional mobility in LUE needed for daily tasks.     Time  4    Period  Weeks    Status  On-going      OT SHORT TERM GOAL #4   Title  Patient will increase LUE strength to 3+/5 to increase ability to complete tasks at shoulder height with less difficulty.     Time  4    Period  Weeks      OT SHORT TERM GOAL #5   Title  Patient will decrease pain level with shoulder movements and daily tasks to 4/10 or less.     Time  4    Period  Weeks        OT Long Term Goals - 09/06/17 0756      OT LONG TERM GOAL #1   Title  Patient will return to highest level of independence with all daily tasks while able to return to work using  his LUE as his non-dominant.     Time  8    Period  Weeks    Status  On-going      OT LONG TERM GOAL #2   Title  Patient will report a decreased pain level during daily and work related tasks of 2/10 or less.     Time  8    Period  Weeks    Status  On-going      OT LONG TERM GOAL #3   Title  Patient will increase A/ROM of LUE to WNL to increase ability to complete tasks above shoulder level as needed.     Time  8    Period  Weeks      OT LONG TERM GOAL #4   Title  Patient will increase LUE strength to 4+/5 in order to be able to return to work and complete required tasks with less difficulty.     Time  8    Period  Weeks    Status  On-going            Plan - 09/06/17 0754    Clinical Impression Statement  A: Pt arrived today with fascial restrictions and trigger points in the anterior shoulder region and trapezius muscle on the left UE. Pt with decreased A/ROM with noticable muscle tightness in the left pectoralis minor during strengthening exercises. Therapist completed manual techniques to release pectoralis minor and patient was able to achieve full range of motion at end of session. Education completed regarding pectoalis minor stretch using the wall.     Plan  P: Follow up on pectoralis release and continue working on strengthening LUE shoulder for stability and strength.     Consulted and Agree with Plan of Care  Patient       Patient will benefit from skilled therapeutic intervention in order to improve the following deficits and impairments:  Pain, Impaired UE functional use, Increased fascial restrictions, Decreased range of motion  Visit Diagnosis: Stiffness of left shoulder, not elsewhere classified  Acute pain of left shoulder  Other  symptoms and signs involving the musculoskeletal system    Problem List Patient Active Problem List   Diagnosis Date Noted  . S/P rotator cuff repair 02/10/2014  . Pain in joint, shoulder region 02/10/2014  . Muscle  weakness (generalized) 02/10/2014  . Decreased range of motion of right shoulder 02/10/2014   Ailene Ravel, OTR/L,CBIS  [phone]  09/06/2017, 7:57 AM  [place], [place], [address] Phone: [phone]   Fax:  [phone]  Name: Marcus Chen MRN: [id number] Date of Birth: 1965-07-23

## 2017-09-07 ENCOUNTER — Encounter (HOSPITAL_COMMUNITY): Payer: PRIVATE HEALTH INSURANCE

## 2017-09-10 ENCOUNTER — Encounter (HOSPITAL_COMMUNITY): Payer: Self-pay | Admitting: Specialist

## 2017-09-10 ENCOUNTER — Ambulatory Visit (HOSPITAL_COMMUNITY): Payer: PRIVATE HEALTH INSURANCE | Admitting: Specialist

## 2017-09-10 DIAGNOSIS — R29898 Other symptoms and signs involving the musculoskeletal system: Secondary | ICD-10-CM

## 2017-09-10 DIAGNOSIS — M25612 Stiffness of left shoulder, not elsewhere classified: Secondary | ICD-10-CM

## 2017-09-10 DIAGNOSIS — M25512 Pain in left shoulder: Secondary | ICD-10-CM

## 2017-09-10 NOTE — Therapy (Signed)
[place], [place], [address] Phone: [phone]   Fax:  [phone]  Occupational Therapy Treatment  Patient Details  Name: Marcus Chen MRN: [id number] Date of Birth: 02/01/1966 Referring Provider: Vickey Huger MD   Encounter Date: 09/10/2017  OT End of Session - 09/10/17 1601    Visit Number  30    Number of Visits  32    Date for OT Re-Evaluation  09/12/17    Authorization Type  [employer]    OT Start Time  1116    OT Stop Time  1200    OT Time Calculation (min)  44 min    Activity Tolerance  Patient tolerated treatment well    Behavior During Therapy  [facility] for tasks assessed/performed       Past Medical History:  Diagnosis Date  . AC (acromioclavicular) joint bone spurs    spurs  . Allergy   . Asthma   . Gout   . Kidney stone   . Post-operative nausea and vomiting   . Shoulder pain, bilateral     Past Surgical History:  Procedure Laterality Date  . APPENDECTOMY    . ROTATOR CUFF REPAIR  2014   right/ left shoulder was done 2010  . stomach reduction  2003  . TONSILLECTOMY      There were no vitals filed for this visit.  Subjective Assessment - 09/10/17 1600    Subjective   S:  I still have tightness underneath my pec.    Currently in Pain?  Yes    Pain Score  3     Pain Location  Shoulder    Pain Orientation  Left;Anterior    Pain Descriptors / Indicators  Aching    Pain Type  Acute pain         [facility] OT Assessment - 09/10/17 0001      Assessment   Medical Diagnosis  left RTC repair, SAD, SA      Precautions   Precautions  Shoulder               OT Treatments/Exercises (OP) - 09/10/17 0001      Exercises   Exercises  Shoulder      Shoulder Exercises: Supine   External Rotation  PROM;5 reps    Internal Rotation  PROM;5 reps    Flexion  PROM;5 reps    ABduction  PROM;5 reps      Shoulder Exercises: Prone   Other Prone Exercises  I Y T with 3# 15 repetiitions     Other Prone Exercises  prone ball drops-flexion 1' with no rest      Shoulder Exercises: ROM/Strengthening   Pushups  -- on low wall 13 repetitions     Other ROM/Strengthening Exercises  arnold press 3# 10 repetiitons, leaning prone horizontal abduction with end range external rotation 15 times 3#      Manual Therapy   Manual Therapy  Myofascial release    Manual therapy comments  Manual therapy completed prior to exercises.    Myofascial Release  Myofascial release and manual stretching completed to left upper shoulder region, subscapularis, pec minor to decrease fascial restrictions and increase joint mobility in a pain free zone.                OT Short Term Goals - 09/06/17 0756      OT SHORT TERM GOAL #1   Title  Patient will be educated and independent with HEP  to increase functional performance during daily tasks using LUE.    Time  4    Period  Weeks      OT SHORT TERM GOAL #2   Title  patient will increase A/ROM to [facility] to increase ability to get dressed with less difficulty.    Time  4    Period  Weeks      OT SHORT TERM GOAL #3   Title  Patient will decrease fascial restrictions to trace amount or less to increase functional mobility in LUE needed for daily tasks.     Time  4    Period  Weeks    Status  On-going      OT SHORT TERM GOAL #4   Title  Patient will increase LUE strength to 3+/5 to increase ability to complete tasks at shoulder height with less difficulty.     Time  4    Period  Weeks      OT SHORT TERM GOAL #5   Title  Patient will decrease pain level with shoulder movements and daily tasks to 4/10 or less.     Time  4    Period  Weeks        OT Long Term Goals - 09/06/17 0756      OT LONG TERM GOAL #1   Title  Patient will return to highest level of independence with all daily tasks while able to return to work using his LUE as his non-dominant.     Time  8    Period  Weeks    Status  On-going      OT LONG TERM GOAL #2   Title   Patient will report a decreased pain level during daily and work related tasks of 2/10 or less.     Time  8    Period  Weeks    Status  On-going      OT LONG TERM GOAL #3   Title  Patient will increase A/ROM of LUE to WNL to increase ability to complete tasks above shoulder level as needed.     Time  8    Period  Weeks      OT LONG TERM GOAL #4   Title  Patient will increase LUE strength to 4+/5 in order to be able to return to work and complete required tasks with less difficulty.     Time  8    Period  Weeks    Status  On-going            Plan - 09/10/17 1601    Clinical Impression Statement  A:  Patient continues to have fascial restrictions in pec minor region and subscapularis, which is causing fascial tightness in shoulder capsule and anterior shoulder, which prevents full range needed for overhead reaching and reaching into internal rotation.  After MFR intervention, patient noted improvement in range and pain.  Added arnold press with 3#.  unable to complete chair level push up this date, could complete at counterlevel with an increase from 9 to 13 repetitions.     Plan  P: Add pec minor stretch and release, reassess for recert or dc.        Patient will benefit from skilled therapeutic intervention in order to improve the following deficits and impairments:  Pain, Impaired UE functional use, Increased fascial restrictions, Decreased range of motion  Visit Diagnosis: Stiffness of left shoulder, not elsewhere classified  Acute pain of left shoulder  Other symptoms and  signs involving the musculoskeletal system    Problem List Patient Active Problem List   Diagnosis Date Noted  . S/P rotator cuff repair 02/10/2014  . Pain in joint, shoulder region 02/10/2014  . Muscle weakness (generalized) 02/10/2014  . Decreased range of motion of right shoulder 02/10/2014    Vangie Bicker, [place], OTR/L [phone]  09/10/2017, 4:09 PM  [place] [address] [place], [place], [address] Phone: [phone]   Fax:  [phone]  Name: BURGESS SHERIFF MRN: [id number] Date of Birth: 12-Mar-1966

## 2017-09-12 ENCOUNTER — Other Ambulatory Visit: Payer: Self-pay

## 2017-09-12 ENCOUNTER — Encounter (HOSPITAL_COMMUNITY): Payer: Self-pay

## 2017-09-12 ENCOUNTER — Ambulatory Visit (HOSPITAL_COMMUNITY): Payer: PRIVATE HEALTH INSURANCE

## 2017-09-12 DIAGNOSIS — M25612 Stiffness of left shoulder, not elsewhere classified: Secondary | ICD-10-CM

## 2017-09-12 DIAGNOSIS — R29898 Other symptoms and signs involving the musculoskeletal system: Secondary | ICD-10-CM

## 2017-09-12 DIAGNOSIS — M25512 Pain in left shoulder: Secondary | ICD-10-CM

## 2017-09-12 NOTE — Therapy (Addendum)
[place], [place], [address] Phone: [phone]   Fax:  [phone]  Occupational Therapy Treatment  Patient Details  Name: Marcus Chen MRN: [id number] Date of Birth: January 26, 1966 Referring Provider: Vickey Huger MD   Encounter Date: 09/12/2017  OT End of Session - 09/12/17 1226    Visit Number  31    Number of Visits  32    Authorization Type  [employer]    OT Start Time  3235 reassessment and discharge    OT Stop Time  1115    OT Time Calculation (min)  40 min    Activity Tolerance  Patient tolerated treatment well    Behavior During Therapy  [facility] for tasks assessed/performed       Past Medical History:  Diagnosis Date  . AC (acromioclavicular) joint bone spurs    spurs  . Allergy   . Asthma   . Gout   . Kidney stone   . Post-operative nausea and vomiting   . Shoulder pain, bilateral     Past Surgical History:  Procedure Laterality Date  . APPENDECTOMY    . ROTATOR CUFF REPAIR  2014   right/ left shoulder was done 2010  . stomach reduction  2003  . TONSILLECTOMY      There were no vitals filed for this visit.  Subjective Assessment - 09/12/17 1224    Subjective   S: I'm still having tightness in this pec and deltoid region.    Special Tests  FOTO score: 63/100    Currently in Pain?  No/denies         [facility] OT Assessment - 09/12/17 1039      Assessment   Medical Diagnosis  left RTC repair, SAD, SA      Precautions   Precautions  Shoulder      Palpation   Palpation comment  Moderate fascial restrictions in left anterior deltoidi      AROM   Overall AROM Comments  Assessed seated. IR/er adducted    AROM Assessment Site  Shoulder    Right/Left Shoulder  Left    Left Shoulder Flexion  174 Degrees prevous: 170    Left Shoulder ABduction  171 Degrees previous: 170    Left Shoulder Internal Rotation  90 Degrees previous: same    Left Shoulder External Rotation  90 Degrees previous: 87       PROM   Overall PROM   Within functional limits for tasks performed      Strength   Overall Strength Comments  Assessed seated. IR/er adducted    Strength Assessment Site  Shoulder    Right/Left Shoulder  Left    Left Shoulder Flexion  5/5 previous: 4/5    Left Shoulder ABduction  4+/5 previous: 4/5    Left Shoulder Internal Rotation  5/5 previous: same    Left Shoulder External Rotation  3+/5 previous: same               OT Treatments/Exercises (OP) - 09/12/17 1103      Exercises   Exercises  Shoulder      Shoulder Exercises: Supine   Protraction  PROM;5 reps    Horizontal ABduction  PROM;5 reps    External Rotation  PROM;5 reps    Internal Rotation  PROM;5 reps    Flexion  PROM;5 reps    ABduction  PROM;5 reps      Shoulder Exercises: ROM/Strengthening   Other ROM/Strengthening Exercises  Overhead  carry; 8#; 2'    Other ROM/Strengthening Exercises  Weighted box lift overhead completed 7 times with 20#; max difficulty      Manual Therapy   Manual Therapy  Myofascial release    Manual therapy comments  Manual therapy completed prior to exercises.    Joint Mobilization  Manual pectoralis minor stretch and pectoralis minor release completed to LUE to increase joint mobility and allow patient to utilize full ROM when completing exercises and functional reacihing tasks.     Myofascial Release  Myofascial release and manual stretching completed to left upper shoulder region, subscapularis, pec minor to decrease fascial restrictions and increase joint mobility in a pain free zone.                OT Short Term Goals - 09/12/17 1228      OT SHORT TERM GOAL #1   Title  Patient will be educated and independent with HEP to increase functional performance during daily tasks using LUE.    Time  4    Period  Weeks      OT SHORT TERM GOAL #2   Title  patient will increase A/ROM to [facility] to increase ability to get dressed with less difficulty.    Time  4    Period   Weeks      OT SHORT TERM GOAL #3   Title  Patient will decrease fascial restrictions to trace amount or less to increase functional mobility in LUE needed for daily tasks.     Time  4    Period  Weeks    Status  Not Met      OT SHORT TERM GOAL #4   Title  Patient will increase LUE strength to 3+/5 to increase ability to complete tasks at shoulder height with less difficulty.     Time  4    Period  Weeks      OT SHORT TERM GOAL #5   Title  Patient will decrease pain level with shoulder movements and daily tasks to 4/10 or less.     Time  4    Period  Weeks        OT Long Term Goals - 09/12/17 1101      OT LONG TERM GOAL #1   Title  Patient will return to highest level of independence with all daily tasks while able to return to work using his LUE as his non-dominant.     Time  8    Period  Weeks    Status  Partially Met      OT LONG TERM GOAL #2   Title  Patient will report a decreased pain level during daily and work related tasks of 2/10 or less.     Time  8    Period  Weeks    Status  Achieved      OT LONG TERM GOAL #3   Title  Patient will increase A/ROM of LUE to WNL to increase ability to complete tasks above shoulder level as needed.     Time  8    Period  Weeks    Status  Partially Met      OT LONG TERM GOAL #4   Title  Patient will increase LUE strength to 4+/5 in order to be able to return to work and complete required tasks with less difficulty.     Time  8    Period  Weeks    Status  Partially Met  Plan - 09/12/17 1227    Clinical Impression Statement  A: Reassessment completed this date. patient has met 4/5 STGs and 1/4 LTGs with 2 additional goals partially met. Patient has made progress with his strength and ROM overall in therapy. At this time, he continues to have moderate amount of fascial restrictions in the anterior portion of his shoulder, mainly in anterior deltoid and pectoralis minor. Patient does respond well to a pectoralis  minor release and stretch although he continues to tighten up between sessions. OT recommends that patient discharge therapy at this point and be evaluated by PT for dry needling. Pt is in agreement with recommendation.     Plan  P: D/C from OT services. patient is to follow up with MD on Friday. He will be evaluated for dry needling next week with PT.    Consulted and Agree with Plan of Care  Patient       Patient will benefit from skilled therapeutic intervention in order to improve the following deficits and impairments:  Pain, Impaired UE functional use, Increased fascial restrictions, Decreased range of motion  Visit Diagnosis: Stiffness of left shoulder, not elsewhere classified  Acute pain of left shoulder  Other symptoms and signs involving the musculoskeletal system    Problem List Patient Active Problem List   Diagnosis Date Noted  . S/P rotator cuff repair 02/10/2014  . Pain in joint, shoulder region 02/10/2014  . Muscle weakness (generalized) 02/10/2014  . Decreased range of motion of right shoulder 02/10/2014   OCCUPATIONAL THERAPY DISCHARGE SUMMARY  Visits from Start of Care: 31  Current functional level related to goals / functional outcomes: See above   Remaining deficits: See above   Education / Equipment: See above Plan: Patient agrees to discharge.  Patient goals were partially met. Patient is being discharged due to lack of progress.  ?????transferring to PT caseload for dry needling.          Ailene Ravel, OTR/L,CBIS  [phone]  09/12/2017, 12:33 PM  [place] [address] [place], [place], [address] Phone: [phone]   Fax:  [phone]  Name: Marcus Chen MRN: [id number] Date of Birth: 02-25-1966

## 2017-09-14 ENCOUNTER — Encounter (HOSPITAL_COMMUNITY): Payer: PRIVATE HEALTH INSURANCE | Admitting: Occupational Therapy

## 2017-09-17 ENCOUNTER — Encounter (HOSPITAL_COMMUNITY): Payer: PRIVATE HEALTH INSURANCE

## 2017-09-18 ENCOUNTER — Ambulatory Visit (HOSPITAL_COMMUNITY): Payer: PRIVATE HEALTH INSURANCE

## 2017-09-18 ENCOUNTER — Encounter (HOSPITAL_COMMUNITY): Payer: Self-pay

## 2017-09-18 DIAGNOSIS — M6281 Muscle weakness (generalized): Secondary | ICD-10-CM

## 2017-09-18 DIAGNOSIS — M62838 Other muscle spasm: Secondary | ICD-10-CM

## 2017-09-18 DIAGNOSIS — M25612 Stiffness of left shoulder, not elsewhere classified: Secondary | ICD-10-CM | POA: Diagnosis not present

## 2017-09-18 DIAGNOSIS — M25512 Pain in left shoulder: Principal | ICD-10-CM

## 2017-09-18 DIAGNOSIS — G8929 Other chronic pain: Secondary | ICD-10-CM

## 2017-09-18 NOTE — Therapy (Signed)
[place], [place], [address] Phone: [phone]   Fax:  [phone]  Physical Therapy Evaluation  Patient Details  Name: Marcus Chen MRN: [id number] Date of Birth: 06-Jun-1965 Referring Provider: Vickey Huger, MD   Encounter Date: 09/18/2017  PT End of Session - 09/18/17 1425    Visit Number  1    Number of Visits  9    Date for PT Re-Evaluation  10/16/17    Authorization Type  [employer]    Authorization Time Period  09/18/17 to 10/16/17    Authorization - Visit Number  1    Authorization - Number of Visits  60    PT Start Time  0900    PT Stop Time  0945    PT Time Calculation (min)  45 min    Activity Tolerance  Patient tolerated treatment well    Behavior During Therapy  [facility] for tasks assessed/performed       Past Medical History:  Diagnosis Date  . AC (acromioclavicular) joint bone spurs    spurs  . Allergy   . Asthma   . Gout   . Kidney stone   . Post-operative nausea and vomiting   . Shoulder pain, bilateral     Past Surgical History:  Procedure Laterality Date  . APPENDECTOMY    . ROTATOR CUFF REPAIR  2014   right/ left shoulder was done 2010  . stomach reduction  2003  . TONSILLECTOMY      There were no vitals filed for this visit.   Subjective Assessment - 09/18/17 0903    Subjective  Pt reports having L RTC repair on 06/13/17. He was fully d/c from OT last week, he does not have any restrictions with regards to his L shoulder. He states that he still has pain and limitations in his L shoulder. He states that he has some pec minor tightness which would limit his ROM. He states that during OT, they would perform manual therapy on it which would help but then 2-3 days later, it would tighten back up. He's also still tight in his shoulder throughout and also tightness in between his biceps and triceps. Movement is what aggravates his pain. This pain is also limits his ability to strength.      Limitations  Lifting    How long can you sit comfortably?  no issues    How long can you stand comfortably?  no issues    How long can you walk comfortably?  no issues    Patient Stated Goals  get better, be able to strengthen    Currently in Pain?  No/denies         [facility] PT Assessment - 09/18/17 0001      Assessment   Medical Diagnosis  dry needling L RTC, SAD, SA    Referring Provider  Vickey Huger, MD    Onset Date/Surgical Date  06/13/17    Next MD Visit  no f/u visit scheduled    Prior Therapy  OT for RTC repair      Balance Screen   Has the patient fallen in the past 6 months  No    Has the patient had a decrease in activity level because of a fear of falling?   No    Is the patient reluctant to leave their home because of a fear of falling?   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    Leisure  fish, cook, fly [employer], outdoors      ROM / Strength   AROM / PROM / Strength  AROM;Strength      AROM   Right/Left Shoulder  Left    Left Shoulder Flexion  165 Degrees pain    Left Shoulder ABduction  174 Degrees pain    Left Shoulder Internal Rotation  45 Degrees in prone    Left Shoulder External Rotation  72 Degrees in prone      Strength   Strength Assessment Site  Shoulder    Right/Left Shoulder  Left    Left Shoulder Flexion  4/5 pain    Left Shoulder ABduction  4/5 pain    Left Shoulder Internal Rotation  4+/5    Left Shoulder External Rotation  3+/5      Palpation   Palpation comment  Moderate soft tissue restrictions in L anterior/middle/posterior deltoid, pec minor, RTC musculature, teres major and latissiumus dorsi (near insertion at humerus); recreation of pain reported with palpation throughout           Objective measurements completed on examination: See above findings.        [facility] Adult PT Treatment/Exercise - 09/18/17 0001       Manual Therapy   Manual Therapy  Soft tissue mobilization    Manual therapy comments  completed separate rest of treatment    Soft tissue mobilization  STM following dry needling for continued pain control and soft tissue restrictions       Trigger Point Dry Needling - 09/18/17 1648    Consent Given?  Yes    Education Handout Provided  Yes    Muscles Treated Upper Body  Suboccipitals muscle group    SubOccipitals Response  Twitch response elicited;Palpable increased muscle length L anterior and middle deltoid           PT Education - 09/18/17 1425    Education provided  Yes    Education Details  exam findings, POC, continue postural strengthening and stretching HEP; risk and benefits of trigger point dry needling    Person(s) Educated  Patient    Methods  Explanation;Demonstration    Comprehension  Verbalized understanding;Returned demonstration       PT Short Term Goals - 09/18/17 1643      PT SHORT TERM GOAL #1   Title  Pt will be independent with HEP and perform consistently in order to decrease pain and maximize overall function.    Time  2    Period  Weeks    Status  New    Target Date  10/02/17      PT SHORT TERM GOAL #2   Title  Pt will have improved L GH MMT by 1/2 grade throughout in order to decrease pain and maximize function at work.    Time  2    Period  Weeks    Status  New      PT SHORT TERM GOAL #3   Title  Pt will have improved L GH flexion and abduction AROM by 5 deg or > and L GH IR/ER by 10 deg in order to demo improved soft tissue flexibility and decrease overall pain.    Time  2    Period  Weeks    Status  New        PT Long Term Goals -  09/18/17 1643      PT LONG TERM GOAL #1   Title  Pt will have improved L GH MMT by 1 grade throughout in order to further decrease pain and maximize function at work.    Time  4    Period  Weeks    Status  New    Target Date  10/16/17      PT LONG TERM GOAL #2   Title  Pt will have decreased soft  tissue restrictions to minimal throughout RTC musculature, deltoid, and pec minor in order to decrease pain and maximize overall GH ROM.    Time  4    Period  Weeks    Status  New      PT LONG TERM GOAL #3   Title  Pt will report improved overall ability to perform work duties with 3/10 L shoulder pain or < in order to demo improved overall strength and maximize QOL.    Time  4    Period  Weeks    Status  New      PT LONG TERM GOAL #4   Title  Pt will report decreased L shoulder pain to 3/10 or < to allow him to perform strength training with min to no pain in order to maximize his ability to perform work duties and heavy household duties with greater ease.    Time  4    Period  Weeks    Status  New             Plan - 09/18/17 1426    Clinical Impression Statement  Pt presents to OPPT for evaluation and dry needling of L shoulder s/p L RTC, SAD, SA on 06/13/17. Pt with almost full AROM throughout L GH joint, however, he had pain with all especially GH flexion and abduction. He currently presents with multiple taut bands in L anterior, middle, and posterior delt as well as infraspinatus, supraspinatus, teres minor, teres major, latissimus dorsi (near insertion on humerus), and pec minor. Pt reporting recreation of pain throughout all. Pt recently d/c from [facility] OT and had positive responses to manual therapy with pain and AORM improvements, but did not have a lot of long term success as he stated his arm would tighten back up after a few days. PT explained risks and benefits of trigger point dry needling and pt was agreeable to it. PT focused mainly on anterior and middle deltoid as this was the most restricted and tender trigger points. Pt tender throughout but good twitch responses were elicited and a slight improvement in AROM flexion following occurred. Pt needs skilled PT intervention to address these deficits in order to decrease pain, improve AROM, and maximize pt's ability to strengthen  so as to promote his RTW.     History and Personal Factors relevant to plan of care:  shoulder pain/issues for years; L RTC repair on 06/13/17; active with work having to lift, etc., motivated to participate    Clinical Presentation  Stable    Clinical Presentation due to:  pain, posture, soft tissue restricitons, AROM, MMT    Clinical Decision Making  Low    Rehab Potential  Good    PT Frequency  2x / week    PT Duration  4 weeks    PT Treatment/Interventions  ADLs/Self Care Home Management;Cryotherapy;Electrical Stimulation;Moist Heat;Traction;Ultrasound;Functional mobility training;Therapeutic activities;Therapeutic exercise;Balance training;Neuromuscular re-education;Patient/family education;Manual techniques;Scar mobilization;Compression bandaging;Passive range of motion;Dry needling;Energy conservation;Taping    PT Next Visit Plan  review goals;  begin manual STM/MFR to soft tissue restrictions; stretching; pec minor stretch and release; postural strengthening; assess scapular ROM/mobility    PT Home Exercise Plan  eval: continue postural strengthening provided from OT POC and pec minor stretching    Consulted and Agree with Plan of Care  Patient       Patient will benefit from skilled therapeutic intervention in order to improve the following deficits and impairments:  Decreased range of motion, Decreased strength, Hypomobility, Increased fascial restricitons, Increased muscle spasms, Impaired flexibility, Impaired UE functional use, Improper body mechanics, Postural dysfunction, Pain  Visit Diagnosis: Chronic left shoulder pain - Plan: PT plan of care cert/re-cert  Muscle weakness (generalized) - Plan: PT plan of care cert/re-cert  Other muscle spasm - Plan: PT plan of care cert/re-cert     Problem List Patient Active Problem List   Diagnosis Date Noted  . S/P rotator cuff repair 02/10/2014  . Pain in joint, shoulder region 02/10/2014  . Muscle weakness (generalized) 02/10/2014   . Decreased range of motion of right shoulder 02/10/2014        Geraldine Solar PT, DPT  [place] [address] [place], [place], [address] Phone: [phone]   Fax:  [phone]  Name: JEROMIE GAINOR MRN: [id number] Date of Birth: Aug 10, 1965

## 2017-09-18 NOTE — Patient Instructions (Signed)

## 2017-09-19 ENCOUNTER — Encounter (HOSPITAL_COMMUNITY): Payer: PRIVATE HEALTH INSURANCE

## 2017-09-20 ENCOUNTER — Ambulatory Visit (HOSPITAL_COMMUNITY): Payer: PRIVATE HEALTH INSURANCE

## 2017-09-20 ENCOUNTER — Encounter (HOSPITAL_COMMUNITY): Payer: Self-pay

## 2017-09-20 DIAGNOSIS — M25612 Stiffness of left shoulder, not elsewhere classified: Secondary | ICD-10-CM | POA: Diagnosis not present

## 2017-09-20 DIAGNOSIS — G8929 Other chronic pain: Secondary | ICD-10-CM

## 2017-09-20 DIAGNOSIS — M62838 Other muscle spasm: Secondary | ICD-10-CM

## 2017-09-20 DIAGNOSIS — M25512 Pain in left shoulder: Principal | ICD-10-CM

## 2017-09-20 DIAGNOSIS — M6281 Muscle weakness (generalized): Secondary | ICD-10-CM

## 2017-09-20 NOTE — Therapy (Signed)
[place], [place], [address] Phone: [phone]   Fax:  [phone]  Physical Therapy Treatment  Patient Details  Name: Marcus Chen MRN: [id number] Date of Birth: [birth year] Referring Provider: Vickey Huger, MD   Encounter Date: 09/20/2017  PT End of Session - 09/20/17 1031    Visit Number  2    Number of Visits  9    Date for PT Re-Evaluation  10/16/17    Authorization Type  [employer]    Authorization Time Period  09/18/17 to 10/16/17    Authorization - Visit Number  2    Authorization - Number of Visits  60    PT Start Time  1030    Activity Tolerance  Patient tolerated treatment well    Behavior During Therapy  [facility] for tasks assessed/performed       Past Medical History:  Diagnosis Date  . AC (acromioclavicular) joint bone spurs    spurs  . Allergy   . Asthma   . Gout   . Kidney stone   . Post-operative nausea and vomiting   . Shoulder pain, bilateral     Past Surgical History:  Procedure Laterality Date  . APPENDECTOMY    . ROTATOR CUFF REPAIR  2014   right/ left shoulder was done 2010  . stomach reduction  2003  . TONSILLECTOMY      There were no vitals filed for this visit.  Subjective Assessment - 09/20/17 1031    Subjective  Pt states he had the weirdest pain following the needling. He states that it finally decreased. He states that he didn't do nearly as much stretching as he would have normally. He states that it doesn't feel quite as tight as usual but it is tender.    Limitations  Lifting    How long can you sit comfortably?  no issues    How long can you stand comfortably?  no issues    How long can you walk comfortably?  no issues    Patient Stated Goals  get better, be able to strengthen    Currently in Pain?  No/denies           [facility] Adult PT Treatment/Exercise - 09/20/17 1035      Exercises   Exercises  Shoulder      Shoulder Exercises: Supine   Other Supine  Exercises  serratus punches 5# 2x10      Shoulder Exercises: Seated   Horizontal ABduction  Both;20 reps;Theraband    Theraband Level (Shoulder Horizontal ABduction)  Level 4 (Blue)    Horizontal ABduction Limitations  2 sets    Other Seated Exercises  D2 PNF flexion GTB x10 reps, BTB x10 reps (continue wtih GTB for now)      Shoulder Exercises: Standing   Flexion  Both;10 reps;Weights    Shoulder Flexion Weight (lbs)  5    Flexion Limitations  3 sets, last set fosucing on serratus engagement    Extension  Both;20 reps;Theraband    Theraband Level (Shoulder Extension)  Level 4 (Blue)    Row  Both;20 reps;Theraband    Theraband Level (Shoulder Row)  Level 4 (Blue)    Retraction  Both;20 reps;Theraband    Theraband Level (Shoulder Retraction)  Level 4 (Blue)      Shoulder Exercises: Stretch   Other Shoulder Stretches  R anterior delt stretch 3x15"    Other Shoulder Stretches  pec stretch in doorway 3x30"  Manual Therapy   Manual Therapy  Soft tissue mobilization;Myofascial release    Manual therapy comments  completed separate rest of treatment    Soft tissue mobilization  STM to L anterior and middle delt, and pecm minor for soft tissue restrictions and pain control    Myofascial Release  L pec minor release while pt on 1/2 foam roll for soft tissue restrictions and pain release           PT Education - 09/20/17 1033    Education provided  Yes    Education Details  exercise technique, he had normal response to needling    Person(s) Educated  Patient    Methods  Explanation;Demonstration    Comprehension  Verbalized understanding;Returned demonstration       PT Short Term Goals - 09/18/17 1643      PT SHORT TERM GOAL #1   Title  Pt will be independent with HEP and perform consistently in order to decrease pain and maximize overall function.    Time  2    Period  Weeks    Status  New    Target Date  10/02/17      PT SHORT TERM GOAL #2   Title  Pt will have  improved L GH MMT by 1/2 grade throughout in order to decrease pain and maximize function at work.    Time  2    Period  Weeks    Status  New      PT SHORT TERM GOAL #3   Title  Pt will have improved L GH flexion and abduction AROM by 5 deg or > and L GH IR/ER by 10 deg in order to demo improved soft tissue flexibility and decrease overall pain.    Time  2    Period  Weeks    Status  New        PT Long Term Goals - 09/18/17 1643      PT LONG TERM GOAL #1   Title  Pt will have improved L GH MMT by 1 grade throughout in order to further decrease pain and maximize function at work.    Time  4    Period  Weeks    Status  New    Target Date  10/16/17      PT LONG TERM GOAL #2   Title  Pt will have decreased soft tissue restrictions to minimal throughout RTC musculature, deltoid, and pec minor in order to decrease pain and maximize overall GH ROM.    Time  4    Period  Weeks    Status  New      PT LONG TERM GOAL #3   Title  Pt will report improved overall ability to perform work duties with 3/10 L shoulder pain or < in order to demo improved overall strength and maximize QOL.    Time  4    Period  Weeks    Status  New      PT LONG TERM GOAL #4   Title  Pt will report decreased L shoulder pain to 3/10 or < to allow him to perform strength training with min to no pain in order to maximize his ability to perform work duties and heavy household duties with greater ease.    Time  4    Period  Weeks    Status  New            Plan - 09/20/17 1126    Clinical  Impression Statement  Pt presenting to therapy stating he had some increased soreness and "weird" pain following the dry needling; PT explained that his feels were normal and not uncommon for dry needling and he verbalized understanding. Began session on UBE with retro peddling for postural strengthening. Began pec stretch and anterior delt stretch in doorway. Then performed postural strengthening for posterior scapular  musculature in order to decrease pain, decrease forward/rounded shoulders, and improve overall posture. Pt continues to present with increased soft tissue restrictions of entire deltoid throughout as well as pec major and minor. Pt also noted to have L scapular winging during fwd elevation, mainly during descent, indicating serratus anterior weakness. Scapular mobility appeared slightly hypomobile but WFL. Ended with manual for STM to anterior and middle delt and pec minor followed by pec minor release with pt on 1/2 foam roll. Pt reporting improved symptoms at EOS.    Rehab Potential  Good    PT Frequency  2x / week    PT Duration  4 weeks    PT Treatment/Interventions  ADLs/Self Care Home Management;Cryotherapy;Electrical Stimulation;Moist Heat;Traction;Ultrasound;Functional mobility training;Therapeutic activities;Therapeutic exercise;Balance training;Neuromuscular re-education;Patient/family education;Manual techniques;Scar mobilization;Compression bandaging;Passive range of motion;Dry needling;Energy conservation;Taping    PT Next Visit Plan  review goals and issue copy of eval; continue manual STM/MFR to soft tissue restrictions; stretching; pec minor stretch and release; postural strengthening; continue serratus anterior strengthening    PT Home Exercise Plan  eval: continue postural strengthening provided from OT POC and pec minor stretching    Consulted and Agree with Plan of Care  Patient       Patient will benefit from skilled therapeutic intervention in order to improve the following deficits and impairments:  Decreased range of motion, Decreased strength, Hypomobility, Increased fascial restricitons, Increased muscle spasms, Impaired flexibility, Impaired UE functional use, Improper body mechanics, Postural dysfunction, Pain  Visit Diagnosis: Chronic left shoulder pain  Muscle weakness (generalized)  Other muscle spasm     Problem List Patient Active Problem List   Diagnosis  Date Noted  . S/P rotator cuff repair 02/10/2014  . Pain in joint, shoulder region 02/10/2014  . Muscle weakness (generalized) 02/10/2014  . Decreased range of motion of right shoulder 02/10/2014       Geraldine Solar PT, DPT  [place] [address] [place], [place], [address] Phone: [phone]   Fax:  [phone]  Name: KABIR BRANNOCK MRN: [id number] Date of Birth: 08/03/1965

## 2017-09-21 ENCOUNTER — Encounter (HOSPITAL_COMMUNITY): Payer: PRIVATE HEALTH INSURANCE | Admitting: Occupational Therapy

## 2017-09-24 ENCOUNTER — Encounter (HOSPITAL_COMMUNITY): Payer: PRIVATE HEALTH INSURANCE

## 2017-09-25 ENCOUNTER — Encounter (HOSPITAL_COMMUNITY): Payer: PRIVATE HEALTH INSURANCE

## 2017-09-26 ENCOUNTER — Encounter (HOSPITAL_COMMUNITY): Payer: PRIVATE HEALTH INSURANCE

## 2017-09-27 ENCOUNTER — Encounter (HOSPITAL_COMMUNITY): Payer: PRIVATE HEALTH INSURANCE

## 2017-09-27 ENCOUNTER — Telehealth (HOSPITAL_COMMUNITY): Payer: Self-pay | Admitting: Internal Medicine

## 2017-09-27 NOTE — Telephone Encounter (Signed)
09/27/17  won't be able to make it back into town to come to appt.

## 2017-09-28 ENCOUNTER — Encounter (HOSPITAL_COMMUNITY): Payer: PRIVATE HEALTH INSURANCE

## 2017-10-01 ENCOUNTER — Ambulatory Visit (HOSPITAL_COMMUNITY): Payer: 59 | Attending: Orthopedic Surgery

## 2017-10-01 ENCOUNTER — Encounter (HOSPITAL_COMMUNITY): Payer: Self-pay

## 2017-10-01 DIAGNOSIS — M62838 Other muscle spasm: Secondary | ICD-10-CM | POA: Insufficient documentation

## 2017-10-01 DIAGNOSIS — G8929 Other chronic pain: Secondary | ICD-10-CM | POA: Insufficient documentation

## 2017-10-01 DIAGNOSIS — M6281 Muscle weakness (generalized): Secondary | ICD-10-CM | POA: Insufficient documentation

## 2017-10-01 DIAGNOSIS — M25512 Pain in left shoulder: Secondary | ICD-10-CM | POA: Diagnosis not present

## 2017-10-01 NOTE — Patient Instructions (Signed)
Access Code: [email or web address]  URL: [email or web address]  Date: 10/01/2017  Prepared by: Geraldine Solar   Exercises Doorway Pec Stretch at 90 Degrees Abduction - 3-5 reps - 30-60 hold - 1x daily - 7x weekly Doorway Pec Stretch at 90 Elevation with Arm Straight - 3-5 reps - 15-30 hold - 1x daily - 7x weekly

## 2017-10-01 NOTE — Therapy (Signed)
[place], [place], [address] Phone: [phone]   Fax:  [phone]  Physical Therapy Treatment  Patient Details  Name: Marcus Chen MRN: [id number] Date of Birth: [birth year] Referring Provider: Vickey Huger, MD   Encounter Date: 10/01/2017  PT End of Session - 10/01/17 0820    Visit Number  3    Number of Visits  9    Date for PT Re-Evaluation  10/16/17    Authorization Type  [employer]    Authorization Time Period  09/18/17 to 10/16/17    Authorization - Visit Number  3    Authorization - Number of Visits  49    PT Start Time  0819    PT Stop Time  0858    PT Time Calculation (min)  39 min    Activity Tolerance  Patient tolerated treatment well    Behavior During Therapy  [facility] for tasks assessed/performed       Past Medical History:  Diagnosis Date  . AC (acromioclavicular) joint bone spurs    spurs  . Allergy   . Asthma   . Gout   . Kidney stone   . Post-operative nausea and vomiting   . Shoulder pain, bilateral     Past Surgical History:  Procedure Laterality Date  . APPENDECTOMY    . ROTATOR CUFF REPAIR  2014   right/ left shoulder was done 2010  . stomach reduction  2003  . TONSILLECTOMY      There were no vitals filed for this visit.  Subjective Assessment - 10/01/17 0820    Subjective  Pt reports that he's doing well, he says it's not bad currently. Feels the rest has been good for it.     Limitations  Lifting    How long can you sit comfortably?  no issues    How long can you stand comfortably?  no issues    How long can you walk comfortably?  no issues    Patient Stated Goals  get better, be able to strengthen    Currently in Pain?  No/denies            [facility] Adult PT Treatment/Exercise - 10/01/17 0001      Shoulder Exercises: Seated   Flexion  Left;15 reps    Abduction  Left;15 reps    Other Seated Exercises  horiz abd/add x15 reps       Shoulder Exercises: Standing    Other Standing Exercises  chest press/flies with BTB x15 reps       Shoulder Exercises: ROM/Strengthening   UBE (Upper Arm Bike)  x 3 mins retro, L3, postural strengthening      Shoulder Exercises: Stretch   Other Shoulder Stretches  R anterior delt stretch 3x15"    Other Shoulder Stretches  pec stretch in doorway 3x30"      Manual Therapy   Manual Therapy  Soft tissue mobilization    Manual therapy comments  completed separate rest of treatment    Soft tissue mobilization  STM to L anterior and middle delt, and pec major/minor for soft tissue restrictions and pain control       Trigger Point Dry Needling - 10/01/17 0845    Consent Given?  Yes    Education Handout Provided  No    Muscles Treated Upper Body  Pectoralis major;Pectoralis minor;Suboccipitals muscle group    SubOccipitals Response  Twitch response elicited;Palpable increased muscle length L anterior and middle deltoid  Pectoralis Major Response  Twitch response elicited;Palpable increased muscle length L    Pectoralis Minor Response  Twitch response elicited;Palpable increased muscle length L           PT Education - 10/01/17 0820    Education provided  Yes    Education Details  risks and benefits of trigger point dry needling; exercise technique afterwards    Person(s) Educated  Patient    Methods  Explanation;Demonstration    Comprehension  Verbalized understanding;Returned demonstration       PT Short Term Goals - 09/18/17 1643      PT SHORT TERM GOAL #1   Title  Pt will be independent with HEP and perform consistently in order to decrease pain and maximize overall function.    Time  2    Period  Weeks    Status  New    Target Date  10/02/17      PT SHORT TERM GOAL #2   Title  Pt will have improved L GH MMT by 1/2 grade throughout in order to decrease pain and maximize function at work.    Time  2    Period  Weeks    Status  New      PT SHORT TERM GOAL #3   Title  Pt will have improved L GH  flexion and abduction AROM by 5 deg or > and L GH IR/ER by 10 deg in order to demo improved soft tissue flexibility and decrease overall pain.    Time  2    Period  Weeks    Status  New        PT Long Term Goals - 09/18/17 1643      PT LONG TERM GOAL #1   Title  Pt will have improved L GH MMT by 1 grade throughout in order to further decrease pain and maximize function at work.    Time  4    Period  Weeks    Status  New    Target Date  10/16/17      PT LONG TERM GOAL #2   Title  Pt will have decreased soft tissue restrictions to minimal throughout RTC musculature, deltoid, and pec minor in order to decrease pain and maximize overall GH ROM.    Time  4    Period  Weeks    Status  New      PT LONG TERM GOAL #3   Title  Pt will report improved overall ability to perform work duties with 3/10 L shoulder pain or < in order to demo improved overall strength and maximize QOL.    Time  4    Period  Weeks    Status  New      PT LONG TERM GOAL #4   Title  Pt will report decreased L shoulder pain to 3/10 or < to allow him to perform strength training with min to no pain in order to maximize his ability to perform work duties and heavy household duties with greater ease.    Time  4    Period  Weeks    Status  New            Plan - 10/01/17 0859    Clinical Impression Statement  Began session by reviewing initial goals as PT did not do it on the first f/u visit; issued copy of eval and with no f/u questions. Pt continues with restrictions in L anterior and middle delt as well as  L pec major and minor. Pt agreeable to trigger point dry needling again. Good twitch responses elicited throughout. Rest of session focused on muscle relaxation, activation, and stretching of muscle groups needled. Pt reporting feeling like he had a Charlie horse in L shoulder afterwards and that it felt pretty fatigued but PT explained that this is normal and should go away within the next 24-48 hours and he  verbalized understanding. Continue as planned, progressing as able.     Rehab Potential  Good    PT Frequency  2x / week    PT Duration  4 weeks    PT Treatment/Interventions  ADLs/Self Care Home Management;Cryotherapy;Electrical Stimulation;Moist Heat;Traction;Ultrasound;Functional mobility training;Therapeutic activities;Therapeutic exercise;Balance training;Neuromuscular re-education;Patient/family education;Manual techniques;Scar mobilization;Compression bandaging;Passive range of motion;Dry needling;Energy conservation;Taping    PT Next Visit Plan  continue manual STM/MFR to soft tissue restrictions; stretching; pec minor stretch and release; postural strengthening; continue serratus anterior strengthening    PT Home Exercise Plan  eval: continue postural strengthening provided from OT POC and pec minor stretching; 5/6: doorway stretch for pec and atnreior delt    Consulted and Agree with Plan of Care  Patient       Patient will benefit from skilled therapeutic intervention in order to improve the following deficits and impairments:  Decreased range of motion, Decreased strength, Hypomobility, Increased fascial restricitons, Increased muscle spasms, Impaired flexibility, Impaired UE functional use, Improper body mechanics, Postural dysfunction, Pain  Visit Diagnosis: Chronic left shoulder pain  Muscle weakness (generalized)  Other muscle spasm     Problem List Patient Active Problem List   Diagnosis Date Noted  . S/P rotator cuff repair 02/10/2014  . Pain in joint, shoulder region 02/10/2014  . Muscle weakness (generalized) 02/10/2014  . Decreased range of motion of right shoulder 02/10/2014        Geraldine Solar PT, DPT  [place] [address] [place], [place], [address] Phone: [phone]   Fax:  [phone]  Name: NAREN BENALLY MRN: [id number] Date of Birth: [birth year]

## 2017-10-03 ENCOUNTER — Telehealth (HOSPITAL_COMMUNITY): Payer: Self-pay

## 2017-10-03 NOTE — Telephone Encounter (Signed)
He will not be in town and hopes he will be back on 5/16

## 2017-10-04 ENCOUNTER — Ambulatory Visit (HOSPITAL_COMMUNITY): Payer: 59

## 2017-10-09 ENCOUNTER — Encounter (HOSPITAL_COMMUNITY): Payer: PRIVATE HEALTH INSURANCE

## 2017-10-11 ENCOUNTER — Ambulatory Visit (HOSPITAL_COMMUNITY): Payer: 59

## 2017-10-11 ENCOUNTER — Telehealth (HOSPITAL_COMMUNITY): Payer: Self-pay

## 2017-10-11 NOTE — Telephone Encounter (Signed)
Called re no-show; pt stating he thought his appointment was at 10:30. PT told him that his cert ends next week and asked to get him rescheduled. He will be on vacation for the next 10 days but we were able to get him scheduled during the last week of May for his reassessment.   Geraldine Solar PT, DPT

## 2017-10-24 ENCOUNTER — Ambulatory Visit (HOSPITAL_COMMUNITY): Payer: 59

## 2017-10-24 ENCOUNTER — Encounter (HOSPITAL_COMMUNITY): Payer: Self-pay

## 2017-10-24 DIAGNOSIS — M25512 Pain in left shoulder: Principal | ICD-10-CM

## 2017-10-24 DIAGNOSIS — M62838 Other muscle spasm: Secondary | ICD-10-CM

## 2017-10-24 DIAGNOSIS — M6281 Muscle weakness (generalized): Secondary | ICD-10-CM

## 2017-10-24 DIAGNOSIS — G8929 Other chronic pain: Secondary | ICD-10-CM

## 2017-10-24 NOTE — Therapy (Signed)
[place], [place], [address] Phone: [phone]   Fax:  [phone]   Progress Note Reporting Period 09/18/17 to 10/24/17  See note below for Objective Data and Assessment of Progress/Goals.       Physical Therapy Treatment/Reassessment  Patient Details  Name: Marcus Chen MRN: [id number] Date of Birth: 06/03/1965 Referring Provider: Vickey Huger, MD   Encounter Date: 10/24/2017  PT End of Session - 10/24/17 0907    Visit Number  4    Number of Visits  9    Date for PT Re-Evaluation  10/16/17    Authorization Type  [employer]    Authorization Time Period  09/18/17 to 10/16/17    Authorization - Visit Number  4    Authorization - Number of Visits  25    PT Start Time  0907 pt arrived late    PT Stop Time  0922    PT Time Calculation (min)  15 min    Activity Tolerance  Patient tolerated treatment well    Behavior During Therapy  [facility] for tasks assessed/performed       Past Medical History:  Diagnosis Date  . AC (acromioclavicular) joint bone spurs    spurs  . Allergy   . Asthma   . Gout   . Kidney stone   . Post-operative nausea and vomiting   . Shoulder pain, bilateral     Past Surgical History:  Procedure Laterality Date  . APPENDECTOMY    . ROTATOR CUFF REPAIR  2014   right/ left shoulder was done 2010  . stomach reduction  2003  . TONSILLECTOMY      There were no vitals filed for this visit.  Subjective Assessment - 10/24/17 0907    Subjective  Pt states that he had a good vacation, but it was tiring. He reports that his shoulder still feels a little tight but it isn't hurting. He has not tried working out yet but plans to start next week.     Limitations  Lifting    How long can you sit comfortably?  no issues    How long can you stand comfortably?  no issues    How long can you walk comfortably?  no issues    Patient Stated Goals  get better, be able to strengthen    Currently in Pain?   No/denies         [facility] PT Assessment - 10/24/17 0001      Assessment   Medical Diagnosis  dry needling L RTC, SAD, SA    Referring Provider  Vickey Huger, MD    Onset Date/Surgical Date  06/13/17    Next MD Visit  no f/u visit scheduled    Prior Therapy  OT for RTC repair      ROM / Strength   AROM / PROM / Strength  AROM;Strength      AROM   AROM Assessment Site  Shoulder    Left Shoulder Flexion  170 Degrees was 165    Left Shoulder ABduction  174 Degrees was 174    Left Shoulder Internal Rotation  47 Degrees was 45    Left Shoulder External Rotation  72 Degrees was 72      Strength   Strength Assessment Site  Shoulder    Left Shoulder Flexion  4+/5 was 4    Left Shoulder ABduction  4+/5 was 4    Left Shoulder Internal Rotation  5/5 was  4+    Left Shoulder External Rotation  5/5 was 3+                             PT Short Term Goals - 10/24/17 0910      PT SHORT TERM GOAL #1   Title  Pt will be independent with HEP and perform consistently in order to decrease pain and maximize overall function.    Time  2    Period  Weeks    Status  Achieved      PT SHORT TERM GOAL #2   Title  Pt will have improved L GH MMT by 1/2 grade throughout in order to decrease pain and maximize function at work.    Time  2    Period  Weeks    Status  Achieved      PT SHORT TERM GOAL #3   Title  Pt will have improved L GH flexion and abduction AROM by 5 deg or > and L GH IR/ER by 10 deg in order to demo improved soft tissue flexibility and decrease overall pain.    Time  2    Period  Weeks    Status  Partially Met        PT Long Term Goals - 10/24/17 0910      PT LONG TERM GOAL #1   Title  Pt will have improved L GH MMT by 1 grade throughout in order to further decrease pain and maximize function at work.    Time  4    Period  Weeks    Status  Partially Met      PT LONG TERM GOAL #2   Title  Pt will have decreased soft tissue restrictions to minimal  throughout RTC musculature, deltoid, and pec minor in order to decrease pain and maximize overall GH ROM.    Time  4    Period  Weeks    Status  Partially Met      PT LONG TERM GOAL #3   Title  Pt will report improved overall ability to perform work duties with 3/10 L shoulder pain or < in order to demo improved overall strength and maximize QOL.    Baseline  5/29: agrees with this     Time  4    Period  Weeks    Status  Achieved      PT LONG TERM GOAL #4   Title  Pt will report decreased L shoulder pain to 3/10 or < to allow him to perform strength training with min to no pain in order to maximize his ability to perform work duties and heavy household duties with greater ease.    Baseline  5/29: has min to no L shoulder pain, but can do heavy household chores with ease and feels he could perform strength training with min to no pain    Time  4    Period  Weeks    Status  Achieved            Plan - 10/24/17 0935    Clinical Impression Statement  PT returns to therapy for reassessment since his last visit on 10/01/17. Pt states that his shoulder pain has decreased and he reports min to no pain daily, it is just some tightness and minor twinges that he feels intermittently. Pt has partially met or met all of his goals. His AROM improved slightly in flex, abd, and  IR, but overall he is [facility]. His strength improved by at least 1/2 grade throughout and he states that he is not limited in heavy household chores. He feels confident that he can begin strength training with minimal L shoulder pain and feels that it will be what helps him improve even more. PT educated pt that he can begin strength training but to gradually get into it so as to not cause undue stress or potential injury and he verbalized understanding. PT recommends placing pt on 4-week HEP POC so that he can begin to independently strengthen and have a f/u PRN in case he encounters any setbacks or increased pain with strengthening.      Rehab Potential  Good    PT Duration  4 weeks 4-week HEP POC    PT Treatment/Interventions  ADLs/Self Care Home Management;Cryotherapy;Electrical Stimulation;Moist Heat;Traction;Ultrasound;Functional mobility training;Therapeutic activities;Therapeutic exercise;Balance training;Neuromuscular re-education;Patient/family education;Manual techniques;Scar mobilization;Compression bandaging;Passive range of motion;Dry needling;Energy conservation;Taping    PT Next Visit Plan  reassess as needed and d/c if pt does not need f/u visit; continue manual STM/MFR to soft tissue restrictions; stretching; pec minor stretch and release; postural strengthening; continue serratus anterior strengthening    PT Home Exercise Plan  eval: continue postural strengthening provided from OT POC and pec minor stretching; 5/6: doorway stretch for pec and atnreior delt    Consulted and Agree with Plan of Care  Patient       Patient will benefit from skilled therapeutic intervention in order to improve the following deficits and impairments:  Decreased range of motion, Decreased strength, Hypomobility, Increased fascial restricitons, Increased muscle spasms, Impaired flexibility, Impaired UE functional use, Improper body mechanics, Postural dysfunction, Pain  Visit Diagnosis: Chronic left shoulder pain - Plan: PT plan of care cert/re-cert  Muscle weakness (generalized) - Plan: PT plan of care cert/re-cert  Other muscle spasm - Plan: PT plan of care cert/re-cert     Problem List Patient Active Problem List   Diagnosis Date Noted  . S/P rotator cuff repair 02/10/2014  . Pain in joint, shoulder region 02/10/2014  . Muscle weakness (generalized) 02/10/2014  . Decreased range of motion of right shoulder 02/10/2014       Geraldine Solar PT, DPT  [place] [address] [place], [place], [address] Phone: [phone]   Fax:  [phone]  Name: Marcus Chen MRN:  [id number] Date of Birth: 06-17-1965

## 2017-10-26 ENCOUNTER — Encounter (HOSPITAL_COMMUNITY): Payer: Self-pay

## 2017-11-21 ENCOUNTER — Ambulatory Visit (HOSPITAL_COMMUNITY): Payer: 59 | Attending: Orthopedic Surgery

## 2017-11-21 ENCOUNTER — Telehealth (HOSPITAL_COMMUNITY): Payer: Self-pay

## 2017-11-21 NOTE — Telephone Encounter (Signed)
No show #2; left message regarding missed appointment. Asked pt to call our office back to let us know if he wanted to be discharged or rescheduled.   Geraldine Solar PT, DPT

## 2018-01-31 ENCOUNTER — Encounter (HOSPITAL_COMMUNITY): Payer: Self-pay

## 2018-01-31 NOTE — Therapy (Signed)
[place], [place], [address] Phone: [phone]   Fax:  [phone]  Patient Details  Name: Marcus Chen MRN: [id number] Date of Birth: Jan 06, 1966 Referring Provider:  No ref. provider found  Encounter Date: 01/31/2018  PHYSICAL THERAPY DISCHARGE SUMMARY  Visits from Start of Care: 4  Current functional level related to goals / functional outcomes: See last treatment note   Remaining deficits: See last treatment note   Education / Equipment: n/a  Plan: Patient agrees to discharge.  Patient goals were partially met. Patient is being discharged due to not returning since the last visit.  ?????     Geraldine Solar PT, [place] [address] [place], [place], [address] Phone: [phone]   Fax:  [phone]

## 2018-02-19 DIAGNOSIS — Z1389 Encounter for screening for other disorder: Secondary | ICD-10-CM | POA: Diagnosis not present

## 2018-02-19 DIAGNOSIS — L0390 Cellulitis, unspecified: Secondary | ICD-10-CM | POA: Diagnosis not present

## 2018-02-19 DIAGNOSIS — Z6841 Body Mass Index (BMI) 40.0 and over, adult: Secondary | ICD-10-CM | POA: Diagnosis not present

## 2018-02-19 DIAGNOSIS — F909 Attention-deficit hyperactivity disorder, unspecified type: Secondary | ICD-10-CM | POA: Diagnosis not present

## 2018-05-27 DIAGNOSIS — D171 Benign lipomatous neoplasm of skin and subcutaneous tissue of trunk: Secondary | ICD-10-CM | POA: Diagnosis not present

## 2018-05-27 DIAGNOSIS — D170 Benign lipomatous neoplasm of skin and subcutaneous tissue of head, face and neck: Secondary | ICD-10-CM | POA: Diagnosis not present

## 2018-08-08 DIAGNOSIS — M25562 Pain in left knee: Secondary | ICD-10-CM | POA: Diagnosis not present

## 2018-08-08 DIAGNOSIS — M1611 Unilateral primary osteoarthritis, right hip: Secondary | ICD-10-CM | POA: Diagnosis not present

## 2018-12-04 DIAGNOSIS — Z6841 Body Mass Index (BMI) 40.0 and over, adult: Secondary | ICD-10-CM | POA: Diagnosis not present

## 2018-12-04 DIAGNOSIS — Z0000 Encounter for general adult medical examination without abnormal findings: Secondary | ICD-10-CM | POA: Diagnosis not present

## 2018-12-04 DIAGNOSIS — Z1389 Encounter for screening for other disorder: Secondary | ICD-10-CM | POA: Diagnosis not present

## 2018-12-04 DIAGNOSIS — F909 Attention-deficit hyperactivity disorder, unspecified type: Secondary | ICD-10-CM | POA: Diagnosis not present

## 2018-12-04 DIAGNOSIS — J45909 Unspecified asthma, uncomplicated: Secondary | ICD-10-CM | POA: Diagnosis not present

## 2018-12-04 DIAGNOSIS — R7309 Other abnormal glucose: Secondary | ICD-10-CM | POA: Diagnosis not present

## 2018-12-04 DIAGNOSIS — Z2089 Contact with and (suspected) exposure to other communicable diseases: Secondary | ICD-10-CM | POA: Diagnosis not present

## 2018-12-04 DIAGNOSIS — Z0001 Encounter for general adult medical examination with abnormal findings: Secondary | ICD-10-CM | POA: Diagnosis not present

## 2018-12-04 DIAGNOSIS — J302 Other seasonal allergic rhinitis: Secondary | ICD-10-CM | POA: Diagnosis not present

## 2019-02-12 DIAGNOSIS — M2352 Chronic instability of knee, left knee: Secondary | ICD-10-CM | POA: Diagnosis not present

## 2019-02-12 DIAGNOSIS — M25551 Pain in right hip: Secondary | ICD-10-CM | POA: Diagnosis not present

## 2019-02-12 DIAGNOSIS — G8929 Other chronic pain: Secondary | ICD-10-CM | POA: Diagnosis not present

## 2019-02-17 ENCOUNTER — Other Ambulatory Visit: Payer: Self-pay | Admitting: Orthopedic Surgery

## 2019-02-17 DIAGNOSIS — G8929 Other chronic pain: Secondary | ICD-10-CM

## 2019-02-21 DIAGNOSIS — M25551 Pain in right hip: Secondary | ICD-10-CM | POA: Diagnosis not present

## 2019-02-21 DIAGNOSIS — M1611 Unilateral primary osteoarthritis, right hip: Secondary | ICD-10-CM | POA: Diagnosis not present

## 2019-02-28 DIAGNOSIS — G8929 Other chronic pain: Secondary | ICD-10-CM | POA: Diagnosis not present

## 2019-02-28 DIAGNOSIS — M25551 Pain in right hip: Secondary | ICD-10-CM | POA: Diagnosis not present

## 2019-03-06 ENCOUNTER — Encounter (HOSPITAL_COMMUNITY): Payer: Self-pay | Admitting: Physical Therapy

## 2019-03-06 ENCOUNTER — Other Ambulatory Visit: Payer: Self-pay

## 2019-03-06 ENCOUNTER — Ambulatory Visit (HOSPITAL_COMMUNITY): Payer: BC Managed Care – PPO | Attending: Orthopedic Surgery | Admitting: Physical Therapy

## 2019-03-06 DIAGNOSIS — R2689 Other abnormalities of gait and mobility: Secondary | ICD-10-CM | POA: Insufficient documentation

## 2019-03-06 DIAGNOSIS — M25551 Pain in right hip: Secondary | ICD-10-CM | POA: Diagnosis not present

## 2019-03-06 DIAGNOSIS — M25651 Stiffness of right hip, not elsewhere classified: Secondary | ICD-10-CM | POA: Diagnosis not present

## 2019-03-06 NOTE — Therapy (Signed)
[facility] [place], [place], [address] Phone: [phone]   Fax:  [phone]  Physical Therapy Evaluation  Patient Details  Name: Marcus Chen MRN: [id number] Date of Birth: [birth year] Referring Provider (PT): Vickey Huger MD   Encounter Date: 03/06/2019  PT End of Session - 03/06/19 0910    Visit Number  1    Number of Visits  12    Date for PT Re-Evaluation  04/17/19   Mini re-assess 03/27/19   Authorization Type  [employer] (30 visit limit shared with PT/OT/Chiro)    Authorization Time Period  03/06/19 to 04/17/19    Authorization - Visit Number  1    Authorization - Number of Visits  30    PT Start Time  0820    PT Stop Time  0900    PT Time Calculation (min)  40 min    Activity Tolerance  Patient tolerated treatment well    Behavior During Therapy  [facility] for tasks assessed/performed       Past Medical History:  Diagnosis Date  . AC (acromioclavicular) joint bone spurs    spurs  . Allergy   . Asthma   . Gout   . Kidney stone   . Post-operative nausea and vomiting   . Shoulder pain, bilateral     Past Surgical History:  Procedure Laterality Date  . APPENDECTOMY    . ROTATOR CUFF REPAIR  2014   right/ left shoulder was done 2010  . stomach reduction  2003  . TONSILLECTOMY      There were no vitals filed for this visit.   Subjective Assessment - 03/06/19 0839    Subjective  Patient presents to therapy with complaint of RT hip pain. Patient says pain began in RT side about 3-4 years ago. Patient denies previous injury, except noting a prior groin strain. Patient says that pain has gotten progressively worse over the last several years, and has really noticed loss of ROM. Patient says he would like to begin strengthening to improve hip function and avoid having to get surgery. Patient reports having steroid injection in Rt hip 3 weeks ago which he says has been helpful.    Pertinent History  Steroid injection to  RT hip 02/14/19    Limitations  Lifting;Walking;House hold activities;Other (comment)   sleeping, stairs   How long can you sit comfortably?  no issues    How long can you stand comfortably?  no issues    How long can you walk comfortably?  15-20 min post injection    Diagnostic tests  xray    Patient Stated Goals  Improve range of motion (pain free)    Currently in Pain?  Yes    Pain Score  2    5/10 with activity   Pain Location  Hip    Pain Orientation  Right;Anterior    Pain Descriptors / Indicators  Dull;Nagging;Cramping    Pain Type  Chronic pain    Pain Radiating Towards  Occasional pain radiating to RT knee    Pain Onset  More than a month ago    Pain Frequency  Intermittent    Aggravating Factors   Getting in and out of car, standing from sitting, stairs, lifting    Pain Relieving Factors  stretching, sitting, tylenol, ibuprofen    Effect of Pain on Daily Activities  Limiting    Multiple Pain Sites  No         [facility]  PT Assessment - 03/06/19 0001      Assessment   Medical Diagnosis  RT hip pain     Referring Provider (PT)  Vickey Huger MD    Onset Date/Surgical Date  --   3-4 years ago   Next MD Visit  Not scheduled    Prior Therapy  --   None     Precautions   Precautions  None      Restrictions   Weight Bearing Restrictions  No      Balance Screen   Has the patient fallen in the past 6 months  No      Prior Function   Level of Independence  Independent    Vocation  Full time employment    Vocation Requirements  travel, standing, lifting, pulling     Leisure  fishing, walking, travel      Cognition   Overall Cognitive Status  Within Functional Limits for tasks assessed      Sensation   Light Touch  Appears Intact      ROM / Strength   AROM / PROM / Strength  AROM;Strength      AROM   AROM Assessment Site  Hip;Knee    Right/Left Hip  Right;Left    Right Hip Flexion  88    Right Hip External Rotation   33    Right Hip Internal Rotation   16     Left Hip Flexion  112    Right/Left Knee  --      Strength   Strength Assessment Site  Hip;Knee    Right/Left Hip  Right;Left    Right Hip Flexion  4-/5    Right Hip Extension  4-/5    Right Hip ABduction  4-/5    Left Hip Flexion  5/5    Left Hip Extension  4+/5    Left Hip ABduction  4+/5    Right/Left Knee  Right;Left    Right Knee Flexion  5/5    Right Knee Extension  5/5    Left Knee Flexion  5/5    Left Knee Extension  5/5      Palpation   Palpation comment  Mod tenderness to palpation about anterior RT hip joint, proximal RT quadricep      Transfers   Transfers  Sit to Stand;Independent with all Transfers      Ambulation/Gait   Ambulation/Gait  Yes    Ambulation/Gait Assistance  7: Independent    Gait Pattern  Decreased stance time - right;Decreased stride length;Decreased weight shift to right;Antalgic    Ambulation Surface  Level                Objective measurements completed on examination: See above findings.              PT Education - 03/06/19 0909    Education Details  Patient educated on therapy diagnosis, plan of care and HEP    Person(s) Educated  Patient    Methods  Explanation    Comprehension  Verbalized understanding;Returned demonstration       PT Short Term Goals - 03/06/19 0926      PT SHORT TERM GOAL #1   Title  Patient will improve RT hip abd and ext by 1/2 MMT strength grade to improve gait and stair ambulation    Time  3    Period  Weeks    Status  New    Target Date  03/27/19  PT SHORT TERM GOAL #2   Title  Patient will increase RT hip flexion to >100 degress to improve car transfers and stair ambulation    Time  3    Period  Weeks    Status  New    Target Date  03/27/19      PT SHORT TERM GOAL #3   Title  Patient will be able to ambulate IND >30 minutes without increased pain to improve tolerance to work activity    Baseline  15-20 minutes    Time  3    Period  Weeks    Status  New    Target Date   03/27/19        PT Long Term Goals - 03/06/19 0930      PT LONG TERM GOAL #1   Title  Patient will have RLE MMT > or equal to 4+/5 throughout to improve transfers and stair ambulation    Time  6    Period  Weeks    Status  New    Target Date  04/17/19      PT LONG TERM GOAL #2   Title  Patient will have RT hip flexion > or eual to 110 degrees to improved ability to ambulate stairs and perform car transfers    Time  6    Period  Weeks    Status  New    Target Date  04/17/19      PT LONG TERM GOAL #3   Title  Patient will have 0/10 hip pain at rest in order to sleep throughout the night, without disruption due to hip pain    Time  6    Period  Weeks    Status  New    Target Date  04/17/19             Plan - 03/06/19 0911    Clinical Impression Statement  Patient is a 53 year old male who presents to physical therapy with complaint of RT hip pain. Patient demos decreased AROM, strength deficits, increased tenderness to palpation and gait abnormalities which are likely contributing to symptoms of pain and are negatively impacting ability to perform ADLs and functional mobility tasks. Patient will benefit from skilled physical therapy to address these deficits to reduce pain and improve LOF with ADLs and functional mobility tasks.    Examination-Activity Limitations  Bend;Sleep;Squat;Stairs;Dressing;Transfers;Locomotion Level;Lift    Examination-Participation Restrictions  Community Activity;Driving;Yard Work    Stability/Clinical Decision Making  Stable/Uncomplicated    Clinical Decision Making  Low    Rehab Potential  Good    PT Frequency  2x / week    PT Duration  6 weeks    PT Treatment/Interventions  ADLs/Self Care Home Management;Stair training;Patient/family education;Therapeutic activities;Moist Heat;Biofeedback;Ultrasound;Traction;Therapeutic exercise;Balance training;Passive range of motion;Dry needling;Energy conservation;Taping;Vasopneumatic Device;Joint  Manipulations;Spinal Manipulations;Electrical Stimulation;Cryotherapy;Gait training;DME Instruction;Neuromuscular re-education;Manual techniques;Functional mobility training;Orthotic Fit/Training    PT Next Visit Plan  Initiate treatment. Add manual STM to RT anterior hip, add RT hip flexor stretching. Add hip strengthening in standing (mini squat, heel raise, standing hip abd, ext). Complete FOTO next session due to time constraint at eval.    PT Home Exercise Plan  03/06/19: Patient educated on bridging and clamshells for hip strengthening HEP    Consulted and Agree with Plan of Care  Patient       Patient will benefit from skilled therapeutic intervention in order to improve the following deficits and impairments:  Abnormal gait, Hypomobility, Impaired flexibility, Improper body mechanics,  Pain, Increased muscle spasms, Difficulty walking, Decreased range of motion, Decreased activity tolerance, Decreased mobility, Decreased strength  Visit Diagnosis: Pain in right hip  Stiffness of right hip, not elsewhere classified  Other abnormalities of gait and mobility     Problem List Patient Active Problem List   Diagnosis Date Noted  . S/P rotator cuff repair 02/10/2014  . Pain in joint, shoulder region 02/10/2014  . Muscle weakness (generalized) 02/10/2014  . Decreased range of motion of right shoulder 02/10/2014   Clarene Critchley PT, DPT 11:53 AM, 03/06/19 [place] PT, DPT  [place] [address] [place], [place], [address] Phone: [phone]   Fax:  [phone]  Name: Marcus Chen MRN: [id number] Date of Birth: [birth year]

## 2019-03-14 ENCOUNTER — Telehealth (HOSPITAL_COMMUNITY): Payer: Self-pay | Admitting: Physical Therapy

## 2019-03-14 ENCOUNTER — Ambulatory Visit (HOSPITAL_COMMUNITY): Payer: BC Managed Care – PPO | Admitting: Physical Therapy

## 2019-03-14 NOTE — Telephone Encounter (Signed)
1st No show. Pt called mailbox is full unable to leave a message re: missed appointment.  Marcus Chen, [place] CLT [phone]

## 2019-03-18 ENCOUNTER — Telehealth (HOSPITAL_COMMUNITY): Payer: Self-pay | Admitting: Physical Therapy

## 2019-03-18 ENCOUNTER — Ambulatory Visit (HOSPITAL_COMMUNITY): Payer: BC Managed Care – PPO | Admitting: Physical Therapy

## 2019-03-18 NOTE — Telephone Encounter (Signed)
2nd NS. Called but mailbox full, unable to leave message and remind patient of next appointment.   8:52 AM, 03/18/19 Marcus Chen, DPT Physical Therapy with [facility]  [phone] office

## 2019-03-20 ENCOUNTER — Encounter (HOSPITAL_COMMUNITY): Payer: BC Managed Care – PPO | Admitting: Physical Therapy

## 2019-03-20 ENCOUNTER — Encounter (HOSPITAL_COMMUNITY): Payer: Self-pay | Admitting: Physical Therapy

## 2019-03-20 ENCOUNTER — Telehealth (HOSPITAL_COMMUNITY): Payer: Self-pay | Admitting: Physical Therapy

## 2019-03-20 NOTE — Therapy (Signed)
[place], [place], [address] Phone: [phone]   Fax:  [phone]  Patient Details  Name: Marcus Chen MRN: [id number] Date of Birth: Jun 04, 1965 Referring Provider:  No ref. provider found  Encounter Date: 03/20/2019   PHYSICAL THERAPY DISCHARGE SUMMARY  Visits from Start of Care: 1  Current functional level related to goals / functional outcomes: Unknown as patient did not return following evaluation.    Remaining deficits: Unknown as patient did not return following evaluation.   Education / Equipment: Unknown as patient did not return following evaluation.  Plan: Patient agrees to discharge.  Patient goals were not met. Patient is being discharged due to not returning since the last visit.  ?????     3 consecutive no-shows. Discharged due to attendance policy.   Clarene Critchley PT, DPT 10:48 AM, 03/20/19 [place], [place], [address] Phone: [phone]   Fax:  [phone]

## 2019-03-20 NOTE — Telephone Encounter (Signed)
Therapist called regarding patient's 3rd no-show. Voicemail box full and unable to leave message to relay massage that per attendance policy, patient will be discharged from further therapy services.  Clarene Critchley PT, DPT 10:45 AM, 03/20/19 [phone]

## 2019-03-25 ENCOUNTER — Encounter (HOSPITAL_COMMUNITY): Payer: BC Managed Care – PPO | Admitting: Physical Therapy

## 2019-03-27 ENCOUNTER — Encounter (HOSPITAL_COMMUNITY): Payer: BC Managed Care – PPO | Admitting: Physical Therapy

## 2019-04-01 ENCOUNTER — Encounter (HOSPITAL_COMMUNITY): Payer: BC Managed Care – PPO | Admitting: Physical Therapy

## 2019-04-03 ENCOUNTER — Encounter (HOSPITAL_COMMUNITY): Payer: BC Managed Care – PPO

## 2019-04-08 ENCOUNTER — Encounter (HOSPITAL_COMMUNITY): Payer: BC Managed Care – PPO | Admitting: Physical Therapy

## 2019-04-10 ENCOUNTER — Encounter (HOSPITAL_COMMUNITY): Payer: BC Managed Care – PPO | Admitting: Physical Therapy

## 2019-05-13 ENCOUNTER — Ambulatory Visit
Admission: EM | Admit: 2019-05-13 | Discharge: 2019-05-13 | Disposition: A | Discharge status: Home / Self Care | Payer: BC Managed Care – PPO | Attending: Urgent Care | Admitting: Urgent Care

## 2019-05-13 ENCOUNTER — Other Ambulatory Visit: Payer: Self-pay

## 2019-05-13 DIAGNOSIS — J3089 Other allergic rhinitis: Secondary | ICD-10-CM

## 2019-05-13 DIAGNOSIS — Z20822 Contact with and (suspected) exposure to covid-19: Secondary | ICD-10-CM

## 2019-05-13 DIAGNOSIS — Z20828 Contact with and (suspected) exposure to other viral communicable diseases: Secondary | ICD-10-CM | POA: Diagnosis not present

## 2019-05-13 DIAGNOSIS — R5381 Other malaise: Secondary | ICD-10-CM

## 2019-05-13 DIAGNOSIS — R0982 Postnasal drip: Secondary | ICD-10-CM | POA: Diagnosis not present

## 2019-05-13 DIAGNOSIS — B349 Viral infection, unspecified: Secondary | ICD-10-CM

## 2019-05-13 DIAGNOSIS — R030 Elevated blood-pressure reading, without diagnosis of hypertension: Secondary | ICD-10-CM

## 2019-05-13 DIAGNOSIS — R0981 Nasal congestion: Secondary | ICD-10-CM | POA: Diagnosis not present

## 2019-05-13 DIAGNOSIS — R5383 Other fatigue: Secondary | ICD-10-CM

## 2019-05-13 MED ORDER — PROMETHAZINE-DM 6.25-15 MG/5ML PO SYRP: 5.0000 mL | ORAL_SOLUTION | Freq: Three times a day (TID) | ORAL | 0 refills | Status: DC | PRN

## 2019-05-13 MED ORDER — BENZONATATE 100 MG PO CAPS: 100.0000 mg | ORAL_CAPSULE | Freq: Three times a day (TID) | ORAL | 0 refills | Status: DC | PRN

## 2019-05-13 MED ORDER — ALBUTEROL SULFATE HFA 108 (90 BASE) MCG/ACT IN AERS: 1.0000 | INHALATION_SPRAY | Freq: Four times a day (QID) | RESPIRATORY_TRACT | 0 refills | Status: AC | PRN

## 2019-05-13 NOTE — ED Triage Notes (Signed)
Pt presents with some sinus symptoms and fatigue for past couple weeks

## 2019-05-13 NOTE — Discharge Instructions (Signed)
We will manage this as a viral syndrome. For sore throat or cough try using a honey-based tea. Use 3 teaspoons of honey with juice squeezed from half lemon. Place shaved pieces of ginger into 1/2-1 cup of water and warm over stove top. Then mix the ingredients and repeat every 4 hours as needed. Please take Tylenol 500mg  every 6 hours. Hydrate very well with at least 2 liters of water. Eat light meals such as soups to replenish electrolytes and soft fruits, veggies. Start an antihistamine like Zyrtec, Allegra or Claritin for postnasal drainage, sinus congestion.  You can take this together with pseudoephedrine (Sudafed) at a dose of 60 mg 3 times a day or twice daily as needed for the same kind of congestion.

## 2019-05-13 NOTE — ED Provider Notes (Signed)
Normal   MRN: [id number] DOB: 1966-03-02  Subjective:   Marcus Chen is a 53 y.o. male presenting for COVID-19 testing. Patient had exposure to COVID through his daughter. Also had 2 people at work that tested positive.  Has had some mild general malaise.  States that it has been improving over the past 1 to 2 weeks.  Has a history of persistent allergies, chronic cough.  Also has a history of asthma.  Needs a refill on his albuterol inhaler.  Takes Flonase, Zyrtec and Singulair regularly.   No current facility-administered medications for this encounter.  Current Outpatient Medications:  .  albuterol (VENTOLIN HFA) 108 (90 Base) MCG/ACT inhaler, Inhale 1-2 puffs into the lungs every 6 (six) hours as needed for wheezing or shortness of breath., Disp: 18 g, Rfl: 0 .  allopurinol (ZYLOPRIM) 300 MG tablet, Take 300 mg by mouth daily., Disp: , Rfl:  .  cetirizine (ZYRTEC) 10 MG tablet, Take 10 mg by mouth daily., Disp: , Rfl:  .  ibuprofen (ADVIL,MOTRIN) 200 MG tablet, Take 800 mg by mouth every 6 (six) hours as needed. Pain, Disp: , Rfl:  .  Ketorolac Tromethamine (TORADOL ORAL PO), Take 10 mg by mouth as needed., Disp: , Rfl:  .  methylcellulose (ARTIFICIAL TEARS) 1 % ophthalmic solution, Place 1 drop into both eyes as needed., Disp: , Rfl:  .  montelukast (SINGULAIR) 10 MG tablet, Take 10 mg by mouth at bedtime., Disp: , Rfl:  .  omeprazole (PRILOSEC) 20 MG capsule, Take 20 mg by mouth daily., Disp: , Rfl:  .  phentermine 37.5 MG capsule, Take 37.5 mg by mouth every morning., Disp: , Rfl:  .  Tamsulosin HCl (FLOMAX) 0.4 MG CAPS, 1 q HS to aid stone passage, Disp: 7 capsule, Rfl: 0 .  valACYclovir (VALTREX) 500 MG tablet, Take 500 mg by mouth 3 (three) times daily., Disp: , Rfl:     Allergies  Allergen Reactions  . Allegra [Fexofenadine]   . Penicillins Other (See Comments)    Unknown- per parents he was told not to take.    Past Medical History:  Diagnosis  Date  . AC (acromioclavicular) joint bone spurs    spurs  . Allergy   . Asthma   . Gout   . Kidney stone   . Post-operative nausea and vomiting   . Shoulder pain, bilateral      Past Surgical History:  Procedure Laterality Date  . APPENDECTOMY    . ROTATOR CUFF REPAIR  2014   right/ left shoulder was done 2010  . stomach reduction  2003  . TONSILLECTOMY      History reviewed. No pertinent family history.  Social History   Tobacco Use  . Smoking status: Never Smoker  . Smokeless tobacco: Former Network engineer Use Topics  . Alcohol use: Yes    Comment: occ  . Drug use: No    Review of Systems  Constitutional: Positive for malaise/fatigue. Negative for fever.  HENT: Positive for congestion. Negative for ear pain, sinus pain and sore throat.   Eyes: Negative for discharge and redness.  Respiratory: Negative for cough, hemoptysis, shortness of breath and wheezing.   Cardiovascular: Negative for chest pain.  Gastrointestinal: Negative for abdominal pain, diarrhea, nausea and vomiting.  Genitourinary: Negative for dysuria, flank pain and hematuria.  Musculoskeletal: Negative for myalgias.  Skin: Negative for rash.  Neurological: Negative for dizziness, weakness and headaches.  Psychiatric/Behavioral: Negative for depression and substance abuse.  Objective:   Vitals: BP (!) 144/98   Pulse 86   Temp 98.1 F (36.7 C)   Resp 18   SpO2 96%   Physical Exam Constitutional:      General: He is not in acute distress.    Appearance: Normal appearance. He is well-developed and normal weight. He is not ill-appearing, toxic-appearing or diaphoretic.  HENT:     Head: Normocephalic and atraumatic.     Right Ear: Tympanic membrane, ear canal and external ear normal. There is no impacted cerumen.     Left Ear: Tympanic membrane, ear canal and external ear normal. There is no impacted cerumen.     Nose: Nose normal. No congestion or rhinorrhea.     Mouth/Throat:      Mouth: Mucous membranes are moist.     Pharynx: Oropharynx is clear. No oropharyngeal exudate or posterior oropharyngeal erythema.  Eyes:     General: No scleral icterus.       Right eye: No discharge.        Left eye: No discharge.     Extraocular Movements: Extraocular movements intact.     Conjunctiva/sclera: Conjunctivae normal.     Pupils: Pupils are equal, round, and reactive to light.  Cardiovascular:     Rate and Rhythm: Normal rate and regular rhythm.     Heart sounds: Normal heart sounds. No murmur. No friction rub. No gallop.   Pulmonary:     Effort: Pulmonary effort is normal. No respiratory distress.     Breath sounds: Normal breath sounds. No stridor. No wheezing, rhonchi or rales.  Musculoskeletal:     Cervical back: Normal range of motion and neck supple. No rigidity. No muscular tenderness.  Neurological:     General: No focal deficit present.     Mental Status: He is alert and oriented to person, place, and time.  Psychiatric:        Mood and Affect: Mood normal.        Behavior: Behavior normal.        Thought Content: Thought content normal.     Assessment and Plan :   1. Viral syndrome   2. Exposure to COVID-19 virus   3. Sinus congestion   4. Malaise and fatigue   5. Close exposure to COVID-19 virus   6. Elevated blood pressure reading without diagnosis of hypertension   7. Allergic rhinitis due to other allergic trigger, unspecified seasonality   8. Post-nasal drainage     Albuterol refilled, also provided with prescription for Tessalon Perles and promethazine cough syrup.  Will manage for viral illness such as viral URI, viral rhinitis, possible COVID-19. Counseled patient on nature of COVID-19 including modes of transmission, diagnostic testing, management and supportive care.  Offered symptomatic relief. COVID 19 testing is pending. Counseled patient on potential for adverse effects with medications prescribed/recommended today, ER and return-to-clinic  precautions discussed, patient verbalized understanding.     Jaynee Eagles, PA-C 05/13/19 1402

## 2019-05-15 LAB — NOVEL CORONAVIRUS, NAA: SARS-CoV-2, NAA: NOT DETECTED

## 2019-05-19 ENCOUNTER — Ambulatory Visit
Admission: EM | Admit: 2019-05-19 | Discharge: 2019-05-19 | Disposition: A | Discharge status: Home / Self Care | Payer: BC Managed Care – PPO | Attending: Emergency Medicine | Admitting: Emergency Medicine

## 2019-05-19 ENCOUNTER — Other Ambulatory Visit: Payer: Self-pay

## 2019-05-19 DIAGNOSIS — Z20828 Contact with and (suspected) exposure to other viral communicable diseases: Secondary | ICD-10-CM

## 2019-05-19 DIAGNOSIS — J4521 Mild intermittent asthma with (acute) exacerbation: Secondary | ICD-10-CM | POA: Diagnosis not present

## 2019-05-19 DIAGNOSIS — Z20822 Contact with and (suspected) exposure to covid-19: Secondary | ICD-10-CM

## 2019-05-19 MED ORDER — PREDNISONE 10 MG (21) PO TBPK: ORAL_TABLET | ORAL | 0 refills | Status: DC

## 2019-05-19 MED ORDER — AEROCHAMBER PLUS MISC: 2 refills | Status: DC

## 2019-05-19 NOTE — ED Triage Notes (Addendum)
Pt presents to UC w/ c/o fatige, body aches x1 week. Pt states he had positive covid exposure.

## 2019-05-19 NOTE — Discharge Instructions (Addendum)
Patient prednisone.  Use a spacer with your albuterol.  2 puffs from your albuterol inhaler every 4 hours for 2 days, then every 6 hours for 2 days, then as needed.

## 2019-05-19 NOTE — ED Provider Notes (Signed)
HPI  SUBJECTIVE:  Marcus Chen is a 53 y.o. male who presents with multiple Covid exposures over the past 2 weeks.  Last exposure was 8 days ago was when he spent the whole day with his daughter who later tested positive for Covid.  He had a Covid test 2 days after exposure, states that it was negative.  He reports fatigue, chills, cough, shortness of breath, occasional chest tightness and wheezing, nasal congestion, body aches, headaches, loss of sense of taste.  He denies fevers, sore throat, loss of sense of smell.  No nausea, vomiting, diarrhea, abdominal pain.  He took an antipyretic within 4 to 6 hours of evaluation.  He has been taking 2 puffs from his albuterol inhaler much more frequently than normal, he is using it every 2-3 hours for chest tightness.  It is prescribed for every 4-6 hours.  This helps.  No aggravating factors.  He has a past medical history of seasonal allergies for which he takes Flonase, asthma.  He is compliant with his Singulair.  States that his asthma tends to bother him around this time a year.  He also has history of GERD, gout.  Of note, his wife has had similar symptoms during this time.  [id number], Purcell Nails, MD   Past Medical History:  Diagnosis Date  . AC (acromioclavicular) joint bone spurs    spurs  . Allergy   . Asthma   . Gout   . Kidney stone   . Post-operative nausea and vomiting   . Shoulder pain, bilateral     Past Surgical History:  Procedure Laterality Date  . APPENDECTOMY    . ROTATOR CUFF REPAIR  2014   right/ left shoulder was done 2010  . stomach reduction  2003  . TONSILLECTOMY      Family History  Problem Relation Age of Onset  . Healthy Mother   . Healthy Father     Social History   Tobacco Use  . Smoking status: Never Smoker  . Smokeless tobacco: Former Network engineer Use Topics  . Alcohol use: Yes    Comment: occ  . Drug use: No    No current facility-administered medications for this encounter.  Current  Outpatient Medications:  .  albuterol (VENTOLIN HFA) 108 (90 Base) MCG/ACT inhaler, Inhale 1-2 puffs into the lungs every 6 (six) hours as needed for wheezing or shortness of breath., Disp: 18 g, Rfl: 0 .  allopurinol (ZYLOPRIM) 300 MG tablet, Take 300 mg by mouth daily., Disp: , Rfl:  .  benzonatate (TESSALON) 100 MG capsule, Take 1-2 capsules (100-200 mg total) by mouth 3 (three) times daily as needed for cough., Disp: 90 capsule, Rfl: 0 .  cetirizine (ZYRTEC) 10 MG tablet, Take 10 mg by mouth daily., Disp: , Rfl:  .  ibuprofen (ADVIL,MOTRIN) 200 MG tablet, Take 800 mg by mouth every 6 (six) hours as needed. Pain, Disp: , Rfl:  .  Ketorolac Tromethamine (TORADOL ORAL PO), Take 10 mg by mouth as needed., Disp: , Rfl:  .  methylcellulose (ARTIFICIAL TEARS) 1 % ophthalmic solution, Place 1 drop into both eyes as needed., Disp: , Rfl:  .  montelukast (SINGULAIR) 10 MG tablet, Take 10 mg by mouth at bedtime., Disp: , Rfl:  .  omeprazole (PRILOSEC) 20 MG capsule, Take 20 mg by mouth daily., Disp: , Rfl:  .  phentermine 37.5 MG capsule, Take 37.5 mg by mouth every morning., Disp: , Rfl:  .  predniSONE (STERAPRED UNI-PAK 21 TAB)  10 MG (21) TBPK tablet, Dispense one 6 day pack. Take as directed with food., Disp: 21 tablet, Rfl: 0 .  promethazine-dextromethorphan (PROMETHAZINE-DM) 6.25-15 MG/5ML syrup, Take 5 mLs by mouth 3 (three) times daily as needed for cough., Disp: 100 mL, Rfl: 0 .  Spacer/Aero-Holding Chambers (AEROCHAMBER PLUS) inhaler, Use as instructed, Disp: 1 each, Rfl: 2 .  Tamsulosin HCl (FLOMAX) 0.4 MG CAPS, 1 q HS to aid stone passage, Disp: 7 capsule, Rfl: 0 .  valACYclovir (VALTREX) 500 MG tablet, Take 500 mg by mouth 3 (three) times daily., Disp: , Rfl:   Allergies  Allergen Reactions  . Allegra [Fexofenadine]   . Penicillins Other (See Comments)    Unknown- per parents he was told not to take.     ROS  As noted in HPI.   Physical Exam  BP (!) 145/94 (BP Location: Right Arm)    Pulse 87   Temp 98.5 F (36.9 C) (Oral)   Resp 16   SpO2 97%   Constitutional: Well developed, well nourished, no acute distress Eyes:  EOMI, conjunctiva normal bilaterally HENT: Normocephalic, atraumatic,mucus membranes moist.  No nasal congestion. No sinus tenderness. Respiratory: Normal inspiratory effort, lungs clear bilaterally.  No chest wall tenderness Cardiovascular: Normal rate regular rhythm no murmurs rubs or gallops GI: nondistended skin: No rash, skin intact Musculoskeletal: no deformities Neurologic: Alert & oriented x 3, no focal neuro deficits Psychiatric: Speech and behavior appropriate   ED Course   Medications - No data to display  Orders Placed This Encounter  Procedures  . Novel Coronavirus, NAA ([facility])    Standing Status:   Standing    Number of Occurrences:   1    No results found for this or any previous visit (from the past 24 hour(s)). No results found.  ED Clinical Impression  1. Close exposure to COVID-19 virus   2. Intermittent asthma with acute exacerbation, unspecified asthma severity   3. Suspected COVID-19 virus infection      ED Assessment/Plan  Covid PCR sent.  Suspect viral illness causing asthma exacerbation.  Will send home with a 6-day prednisone taper, and a spacer.  Regularly scheduled albuterol for the next 4 days.  Patient states he does not need a refill on the albuterol inhaler.  Covid PCR negative.  Discussed  MDM, treatment plan, and plan for follow-up with patient. Discussed sn/sx that should prompt return to the ED. patient agrees with plan.   Meds ordered this encounter  Medications  . Spacer/Aero-Holding Chambers (AEROCHAMBER PLUS) inhaler    Sig: Use as instructed    Dispense:  1 each    Refill:  2  . predniSONE (STERAPRED UNI-PAK 21 TAB) 10 MG (21) TBPK tablet    Sig: Dispense one 6 day pack. Take as directed with food.    Dispense:  21 tablet    Refill:  0    *This clinic note was created using  L[employer]. Therefore, there may be occasional mistakes despite careful proofreading.   ?    Melynda Ripple, MD 05/21/19 1042

## 2019-05-21 LAB — NOVEL CORONAVIRUS, NAA: SARS-CoV-2, NAA: NOT DETECTED

## 2019-06-04 DIAGNOSIS — E669 Obesity, unspecified: Secondary | ICD-10-CM | POA: Diagnosis not present

## 2019-06-04 DIAGNOSIS — J45909 Unspecified asthma, uncomplicated: Secondary | ICD-10-CM | POA: Diagnosis not present

## 2019-07-07 DIAGNOSIS — M1611 Unilateral primary osteoarthritis, right hip: Secondary | ICD-10-CM | POA: Diagnosis not present

## 2019-07-11 DIAGNOSIS — M25551 Pain in right hip: Secondary | ICD-10-CM | POA: Diagnosis not present

## 2019-07-11 DIAGNOSIS — M1611 Unilateral primary osteoarthritis, right hip: Secondary | ICD-10-CM | POA: Diagnosis not present

## 2019-07-29 DIAGNOSIS — M5430 Sciatica, unspecified side: Secondary | ICD-10-CM | POA: Diagnosis not present

## 2019-08-19 DIAGNOSIS — F320 Major depressive disorder, single episode, mild: Secondary | ICD-10-CM | POA: Diagnosis not present

## 2019-08-19 DIAGNOSIS — Z6841 Body Mass Index (BMI) 40.0 and over, adult: Secondary | ICD-10-CM | POA: Diagnosis not present

## 2019-08-19 DIAGNOSIS — M1611 Unilateral primary osteoarthritis, right hip: Secondary | ICD-10-CM | POA: Diagnosis not present

## 2019-08-19 DIAGNOSIS — Z1389 Encounter for screening for other disorder: Secondary | ICD-10-CM | POA: Diagnosis not present

## 2019-08-29 DIAGNOSIS — D485 Neoplasm of uncertain behavior of skin: Secondary | ICD-10-CM | POA: Diagnosis not present

## 2019-09-10 ENCOUNTER — Encounter (INDEPENDENT_AMBULATORY_CARE_PROVIDER_SITE_OTHER): Payer: Self-pay

## 2019-09-22 ENCOUNTER — Ambulatory Visit (INDEPENDENT_AMBULATORY_CARE_PROVIDER_SITE_OTHER): Payer: Self-pay | Admitting: Family Medicine

## 2019-10-06 ENCOUNTER — Ambulatory Visit (INDEPENDENT_AMBULATORY_CARE_PROVIDER_SITE_OTHER): Payer: Self-pay | Admitting: Family Medicine

## 2019-12-23 DIAGNOSIS — Z0000 Encounter for general adult medical examination without abnormal findings: Secondary | ICD-10-CM | POA: Diagnosis not present

## 2019-12-23 DIAGNOSIS — J45909 Unspecified asthma, uncomplicated: Secondary | ICD-10-CM | POA: Diagnosis not present

## 2019-12-23 DIAGNOSIS — Z0001 Encounter for general adult medical examination with abnormal findings: Secondary | ICD-10-CM | POA: Diagnosis not present

## 2019-12-23 DIAGNOSIS — Z6838 Body mass index (BMI) 38.0-38.9, adult: Secondary | ICD-10-CM | POA: Diagnosis not present

## 2019-12-23 DIAGNOSIS — E782 Mixed hyperlipidemia: Secondary | ICD-10-CM | POA: Diagnosis not present

## 2019-12-23 DIAGNOSIS — M1611 Unilateral primary osteoarthritis, right hip: Secondary | ICD-10-CM | POA: Diagnosis not present

## 2020-02-25 DIAGNOSIS — M1611 Unilateral primary osteoarthritis, right hip: Secondary | ICD-10-CM | POA: Diagnosis not present

## 2020-02-27 NOTE — H&P (Signed)
HIP ARTHROPLASTY ADMISSION H&P  Patient ID: [name] MRN: [id number] DOB/AGE: 1965-12-25 54 y.o.  Chief Complaint: right hip pain.  Planned Procedure Date: 03/23/2020 Medical Clearance by Rowan Blase PA-C   Cardiac Clearance by  Additional clearance by    HPI: Marcus Chen is a 54 y.o. male who presents for evaluation of OA RIGHT HIP. The patient has a history of pain and functional disability in the right hip due to arthritis and has failed non-surgical conservative treatments for greater than 12 weeks to include NSAID's and/or analgesics, corticosteriod injections, flexibility and strengthening excercises, supervised PT with diminished ADL's post treatment and activity modification.  Onset of symptoms was gradual, starting 6 years ago with gradually worsening course since that time. The patient noted no past surgery on the right hip.  Patient currently rates pain at 7 out of 10 with activity. Patient has night pain, worsening of pain with activity and weight bearing, pain that interferes with activities of daily living, pain with passive range of motion and joint swelling.  Patient has evidence of periarticular osteophytes and joint space narrowing by imaging studies.  There is no active infection.  Past Medical History:  Diagnosis Date  . AC (acromioclavicular) joint bone spurs    spurs  . Allergy   . Asthma   . Gout   . Kidney stone   . Post-operative nausea and vomiting   . Shoulder pain, bilateral    Past Surgical History:  Procedure Laterality Date  . APPENDECTOMY    . ROTATOR CUFF REPAIR  2014   right/ left shoulder was done 2010  . stomach reduction  2003  . TONSILLECTOMY     Allergies  Allergen Reactions  . Allegra [Fexofenadine]   . Penicillins Other (See Comments)    Unknown- per parents he was told not to take.   Prior to Admission medications   Medication Sig Start Date End Date Taking? Authorizing Provider  albuterol (VENTOLIN HFA) 108 (90 Base) MCG/ACT  inhaler Inhale 1-2 puffs into the lungs every 6 (six) hours as needed for wheezing or shortness of breath. 05/13/19   Jaynee Eagles, PA-C  allopurinol (ZYLOPRIM) 300 MG tablet Take 300 mg by mouth daily.    [provider]  benzonatate (TESSALON) 100 MG capsule Take 1-2 capsules (100-200 mg total) by mouth 3 (three) times daily as needed for cough. 05/13/19   Jaynee Eagles, PA-C  cetirizine (ZYRTEC) 10 MG tablet Take 10 mg by mouth daily.    [provider]  ibuprofen (ADVIL,MOTRIN) 200 MG tablet Take 800 mg by mouth every 6 (six) hours as needed. Pain    [provider]  Ketorolac Tromethamine (TORADOL ORAL PO) Take 10 mg by mouth as needed.    [provider]  methylcellulose (ARTIFICIAL TEARS) 1 % ophthalmic solution Place 1 drop into both eyes as needed.    [provider]  montelukast (SINGULAIR) 10 MG tablet Take 10 mg by mouth at bedtime.    [provider]  omeprazole (PRILOSEC) 20 MG capsule Take 20 mg by mouth daily.    [provider]  phentermine 37.5 MG capsule Take 37.5 mg by mouth every morning. 02/19/19   [provider]  predniSONE (STERAPRED UNI-PAK 21 TAB) 10 MG (21) TBPK tablet Dispense one 6 day pack. Take as directed with food. 05/19/19   Melynda Ripple, MD  promethazine-dextromethorphan (PROMETHAZINE-DM) 6.25-15 MG/5ML syrup Take 5 mLs by mouth 3 (three) times daily as needed for cough. 05/13/19  Jaynee Eagles, PA-C  Spacer/Aero-Holding Chambers (AEROCHAMBER PLUS) inhaler Use as instructed 05/19/19   Melynda Ripple, MD  Tamsulosin HCl (FLOMAX) 0.4 MG CAPS 1 q HS to aid stone passage 03/23/12   Daleen Bo, MD  valACYclovir (VALTREX) 500 MG tablet Take 500 mg by mouth 3 (three) times daily.    [provider]  atomoxetine (STRATTERA) 60 MG capsule Take 60 mg by mouth daily.  05/13/19  [provider]   Social History   Socioeconomic History  . Marital status: Married    Spouse name:  Not on file  . Number of children: Not on file  . Years of education: Not on file  . Highest education level: Not on file  Occupational History  . Not on file  Tobacco Use  . Smoking status: Never Smoker  . Smokeless tobacco: Former Network engineer  . Vaping Use: Never used  Substance and Sexual Activity  . Alcohol use: Yes    Comment: occ  . Drug use: No  . Sexual activity: Not on file  Other Topics Concern  . Not on file  Social History Narrative  . Not on file   Social Determinants of Health   Financial Resource Strain:   . Difficulty of Paying Living Expenses: Not on file  Food Insecurity:   . Worried About Charity fundraiser in the Last Year: Not on file  . Ran Out of Food in the Last Year: Not on file  Transportation Needs:   . Lack of Transportation (Medical): Not on file  . Lack of Transportation (Non-Medical): Not on file  Physical Activity:   . Days of Exercise per Week: Not on file  . Minutes of Exercise per Session: Not on file  Stress:   . Feeling of Stress : Not on file  Social Connections:   . Frequency of Communication with Friends and Family: Not on file  . Frequency of Social Gatherings with Friends and Family: Not on file  . Attends Religious Services: Not on file  . Active Member of Clubs or Organizations: Not on file  . Attends Archivist Meetings: Not on file  . Marital Status: Not on file   Family History  Problem Relation Age of Onset  . Healthy Mother   . Healthy Father     ROS: Currently denies lightheadedness, dizziness, Fever, chills, CP, SOB.   No personal history of DVT, PE, MI, or CVA. No loose teeth or dentures All other systems have been reviewed and were otherwise currently negative with the exception of those mentioned in the HPI and as above.  Objective: Vitals: Ht: 70"  Wt: 253 lbs Temp: 98 BP: 122/82 Pulse: 81 O2 97% on room air.   Physical Exam: General: Alert, NAD. Trendelenberg Gait HEENT: EOMI, Good Neck  Extension, Trachea midline, AT/Tappen Pulm: No increased work of breathing.  Clear B/L A/P w/o crackle or wheeze.  CV: RRR, No m/g/r appreciated  GI: soft, NT, ND Neuro: Neuro without gross focal deficit.  Sensation intact distally Skin: No lesions in the area of chief complaint MSK/Surgical Site: Right Hip pain with passive ROM.  Positive Stinchfield.  5/5 strength.  NVI.    Imaging Review Plain radiographs demonstrate severe degenerative joint disease of the right hip.   The bone quality appears to be good for age and reported activity level.  Preoperative templating of the joint replacement has been completed, documented, and submitted to the Operating Room personnel in order to optimize intra-operative  equipment management.  Assessment: OA RIGHT HIP Active Problems:   * No active hospital problems. *   Plan: Plan for Procedure(s): TOTAL HIP ARTHROPLASTY ANTERIOR APPROACH  The patient history, physical exam, clinical judgement of the provider and imaging are consistent with end stage degenerative joint disease and total joint arthroplasty is deemed medically necessary. The treatment options including medical management, injection therapy, and arthroplasty were discussed at length. The risks and benefits of Procedure(s): TOTAL HIP ARTHROPLASTY ANTERIOR APPROACH were presented and reviewed.  The risks of nonoperative treatment, versus surgical intervention including but not limited to continued pain, aseptic loosening, stiffness, dislocation/subluxation, infection, bleeding, nerve injury, blood clots, cardiopulmonary complications, morbidity, mortality, among others were discussed. The patient verbalizes understanding and wishes to proceed with the plan.  Patient is being admitted for surgery, pain control, PT, prophylactic antibiotics, VTE prophylaxis, progressive ambulation, ADL's and discharge planning.   Dental prophylaxis discussed and recommended for 2 years postoperatively.   The  patient does meet the criteria for TXA which will be used perioperatively.    ASA 81 mg BID will be used postoperatively for DVT prophylaxis in addition to SCDs, and early ambulation.  Plan for Oxycodone, Tylenol, Celebrex, for pain.  Robaxin for spasm.    The patient is planning to be discharged home with OPPT in care of his wife     Rachael Fee, Hershal Coria 02/27/2020 7:51 AM

## 2020-02-28 DIAGNOSIS — Z20822 Contact with and (suspected) exposure to covid-19: Secondary | ICD-10-CM | POA: Diagnosis not present

## 2020-03-09 NOTE — Patient Instructions (Addendum)
DUE TO COVID-19 ONLY ONE VISITOR IS ALLOWED TO COME WITH YOU AND STAY IN THE WAITING ROOM ONLY DURING PRE OP AND PROCEDURE DAY OF SURGERY. THE 1 VISITOR  MAY VISIT WITH YOU AFTER SURGERY IN YOUR PRIVATE ROOM DURING VISITING HOURS ONLY!  YOU NEED TO HAVE A COVID 19 TEST ON: 03/19/20 @ 10:00 AM , THIS TEST MUST BE DONE BEFORE SURGERY,  COVID TESTING SITE [place] [address], IT IS ON THE RIGHT GOING OUT [address] APPROXIMATELY  2 MINUTES PAST [employer] ON THE RIGHT. ONCE YOUR COVID TEST IS COMPLETED,  PLEASE BEGIN THE QUARANTINE INSTRUCTIONS AS OUTLINED IN YOUR HANDOUT.                BROCK LARMON    Your procedure is scheduled on: 03/23/20   Report to [facility] Main  Entrance   Report to admitting at: 7:20 AM     Call this number if you have problems the morning of surgery [phone]    Remember:   NO SOLID FOOD AFTER MIDNIGHT THE NIGHT PRIOR TO SURGERY. NOTHING BY MOUTH EXCEPT CLEAR LIQUIDS UNTIL: 6:50 AM . PLEASE FINISH ENSURE DRINK PER SURGEON ORDER  WHICH NEEDS TO BE COMPLETED AT: 6:50 AM .  CLEAR LIQUID DIET   Foods Allowed                                                                     Foods Excluded  Coffee and tea, regular and decaf                             liquids that you cannot  Plain Jell-O any favor except red or purple                                           see through such as: Fruit ices (not with fruit pulp)                                     milk, soups, orange juice  Iced Popsicles                                    All solid food Carbonated beverages, regular and diet                                    Cranberry, grape and apple juices Sports drinks like Gatorade Lightly seasoned clear broth or consume(fat free) Sugar, honey syrup  Sample Menu Breakfast                                Lunch  Supper Cranberry juice                    Beef broth                             Chicken broth Jell-O                                     Grape juice                           Apple juice Coffee or tea                        Jell-O                                      Popsicle                                                Coffee or tea                        Coffee or tea  _____________________________________________________________________  BRUSH YOUR TEETH MORNING OF SURGERY AND RINSE YOUR MOUTH OUT, NO CHEWING GUM CANDY OR MINTS.     Take these medicines the morning of surgery with A SIP OF WATER: allopurinol,cetirizine,gabapentin,omeprazole.Use inhalers and Flonase as usual.                                You may not have any metal on your body including hair pins and              piercings  Do not wear jewelry, lotions, powders or perfumes, deodorant             Men may shave face and neck.   Do not bring valuables to the hospital. [place].  Contacts, dentures or bridgework may not be worn into surgery.  Leave suitcase in the car. After surgery it may be brought to your room.     Patients discharged the day of surgery will not be allowed to drive home. IF YOU ARE HAVING SURGERY AND GOING HOME THE SAME DAY, YOU MUST HAVE AN ADULT TO DRIVE YOU HOME AND BE WITH YOU FOR 24 HOURS. YOU MAY GO HOME BY TAXI OR UBER OR ORTHERWISE, BUT AN ADULT MUST ACCOMPANY YOU HOME AND STAY WITH YOU FOR 24 HOURS.  Name and phone number of your driver:  Special Instructions: N/A              Please read over the following fact sheets you were given: _____________________________________________________________________        [facility] - Preparing for Surgery Before surgery, you can play an important role.  Because skin is not sterile, your skin needs to be as free of germs as possible.  You can reduce the number of germs on your skin by  washing with CHG (chlorahexidine gluconate) soap before surgery.  CHG is an antiseptic  cleaner which kills germs and bonds with the skin to continue killing germs even after washing. Please DO NOT use if you have an allergy to CHG or antibacterial soaps.  If your skin becomes reddened/irritated stop using the CHG and inform your nurse when you arrive at Short Stay. Do not shave (including legs and underarms) for at least 48 hours prior to the first CHG shower.  You may shave your face/neck. Please follow these instructions carefully:  1.  Shower with CHG Soap the night before surgery and the  morning of Surgery.  2.  If you choose to wash your hair, wash your hair first as usual with your  normal  shampoo.  3.  After you shampoo, rinse your hair and body thoroughly to remove the  shampoo.                           4.  Use CHG as you would any other liquid soap.  You can apply chg directly  to the skin and wash                       Gently with a scrungie or clean washcloth.  5.  Apply the CHG Soap to your body ONLY FROM THE NECK DOWN.   Do not use on face/ open                           Wound or open sores. Avoid contact with eyes, ears mouth and genitals (private parts).                       Wash face,  Genitals (private parts) with your normal soap.             6.  Wash thoroughly, paying special attention to the area where your surgery  will be performed.  7.  Thoroughly rinse your body with warm water from the neck down.  8.  DO NOT shower/wash with your normal soap after using and rinsing off  the CHG Soap.                9.  Pat yourself dry with a clean towel.            10.  Wear clean pajamas.            11.  Place clean sheets on your bed the night of your first shower and do not  sleep with pets. Day of Surgery : Do not apply any lotions/deodorants the morning of surgery.  Please wear clean clothes to the hospital/surgery center.  FAILURE TO FOLLOW THESE INSTRUCTIONS MAY RESULT IN THE CANCELLATION OF YOUR SURGERY PATIENT  SIGNATURE_________________________________  NURSE SIGNATURE__________________________________  ________________________________________________________________________   Adam Phenix  An incentive spirometer is a tool that can help keep your lungs clear and active. This tool measures how well you are filling your lungs with each breath. Taking long deep breaths may help reverse or decrease the chance of developing breathing (pulmonary) problems (especially infection) following:  A long period of time when you are unable to move or be active. BEFORE THE PROCEDURE   If the spirometer includes an indicator to show your best effort, your nurse or respiratory therapist will set it to a desired goal.  If possible, sit up  straight or lean slightly forward. Try not to slouch.  Hold the incentive spirometer in an upright position. INSTRUCTIONS FOR USE  1. Sit on the edge of your bed if possible, or sit up as far as you can in bed or on a chair. 2. Hold the incentive spirometer in an upright position. 3. Breathe out normally. 4. Place the mouthpiece in your mouth and seal your lips tightly around it. 5. Breathe in slowly and as deeply as possible, raising the piston or the ball toward the top of the column. 6. Hold your breath for 3-5 seconds or for as long as possible. Allow the piston or ball to fall to the bottom of the column. 7. Remove the mouthpiece from your mouth and breathe out normally. 8. Rest for a few seconds and repeat Steps 1 through 7 at least 10 times every 1-2 hours when you are awake. Take your time and take a few normal breaths between deep breaths. 9. The spirometer may include an indicator to show your best effort. Use the indicator as a goal to work toward during each repetition. 10. After each set of 10 deep breaths, practice coughing to be sure your lungs are clear. If you have an incision (the cut made at the time of surgery), support your incision when coughing  by placing a pillow or rolled up towels firmly against it. Once you are able to get out of bed, walk around indoors and cough well. You may stop using the incentive spirometer when instructed by your caregiver.  RISKS AND COMPLICATIONS  Take your time so you do not get dizzy or light-headed.  If you are in pain, you may need to take or ask for pain medication before doing incentive spirometry. It is harder to take a deep breath if you are having pain. AFTER USE  Rest and breathe slowly and easily.  It can be helpful to keep track of a log of your progress. Your caregiver can provide you with a simple table to help with this. If you are using the spirometer at home, follow these instructions: [place] IF:   You are having difficultly using the spirometer.  You have trouble using the spirometer as often as instructed.  Your pain medication is not giving enough relief while using the spirometer.  You develop fever of 100.5 F (38.1 C) or higher. SEEK IMMEDIATE MEDICAL CARE IF:   You cough up bloody sputum that had not been present before.  You develop fever of 102 F (38.9 C) or greater.  You develop worsening pain at or near the incision site. MAKE SURE YOU:   Understand these instructions.  Will watch your condition.  Will get help right away if you are not doing well or get worse. Document Released: 09/25/2006 Document Revised: 08/07/2011 Document Reviewed: 11/26/2006 [facility] Patient Information 2014 [place], [place].   ________________________________________________________________________

## 2020-03-11 ENCOUNTER — Other Ambulatory Visit: Payer: Self-pay

## 2020-03-11 ENCOUNTER — Encounter (HOSPITAL_COMMUNITY): Payer: Self-pay

## 2020-03-11 ENCOUNTER — Encounter (HOSPITAL_COMMUNITY)
Admission: RE | Admit: 2020-03-11 | Discharge: 2020-03-11 | Disposition: A | Discharge status: Home / Self Care | Payer: BC Managed Care – PPO | Source: Ambulatory Visit | Attending: Orthopedic Surgery | Admitting: Orthopedic Surgery

## 2020-03-11 DIAGNOSIS — Z01812 Encounter for preprocedural laboratory examination: Secondary | ICD-10-CM | POA: Insufficient documentation

## 2020-03-11 HISTORY — DX: Personal history of urinary calculi: Z87.442

## 2020-03-11 HISTORY — DX: Unspecified osteoarthritis, unspecified site: M19.90

## 2020-03-11 LAB — BASIC METABOLIC PANEL
Anion gap: 9 (ref 5–15)
BUN: 18 mg/dL (ref 6–20)
CO2: 27 mmol/L (ref 22–32)
Calcium: 9.6 mg/dL (ref 8.9–10.3)
Chloride: 101 mmol/L (ref 98–111)
Creatinine, Ser: 1 mg/dL (ref 0.61–1.24)
GFR, Estimated: >60 mL/min (ref >60)
Glucose, Bld: 99 mg/dL (ref 70–99)
Potassium: 4.3 mmol/L (ref 3.5–5.1)
Sodium: 137 mmol/L (ref 135–145)

## 2020-03-11 LAB — CBC
HCT: 44.6 % (ref 39.0–52.0)
Hemoglobin: 15.5 g/dL (ref 13.0–17.0)
MCH: 31.5 pg (ref 26.0–34.0)
MCHC: 34.8 g/dL (ref 30.0–36.0)
MCV: 90.7 fL (ref 80.0–100.0)
Platelets: 247 10*3/uL (ref 150–400)
RBC: 4.92 MIL/uL (ref 4.22–5.81)
RDW: 13.1 % (ref 11.5–15.5)
WBC: 7.2 10*3/uL (ref 4.0–10.5)
nRBC: 0 % (ref 0.0–0.2)

## 2020-03-11 LAB — SURGICAL PCR SCREEN
MRSA, PCR: NEGATIVE
Staphylococcus aureus: POSITIVE — AB

## 2020-03-11 NOTE — Progress Notes (Addendum)
COVID Vaccine Completed: Yes Date COVID Vaccine completed: 09/16/19 COVID vaccine manufacturer:    Moderna    PCP - Dr. Ranell Patrick. Cardiologist -   Chest x-ray -  EKG -  Stress Test -  ECHO -  Cardiac Cath -  Pacemaker/ICD device last checked:  Sleep Study -  CPAP -   Fasting Blood Sugar -  Checks Blood Sugar _____ times a day  Blood Thinner Instructions: Aspirin Instructions: Last Dose:  Anesthesia review:   Patient denies shortness of breath, fever, cough and chest pain at PAT appointment   Patient verbalized understanding of instructions that were given to them at the PAT appointment. Patient was also instructed that they will need to review over the PAT instructions again at home before surgery.

## 2020-03-11 NOTE — Progress Notes (Signed)
PCR: STAPH Positive.

## 2020-03-19 ENCOUNTER — Other Ambulatory Visit (HOSPITAL_COMMUNITY)
Admission: RE | Admit: 2020-03-19 | Discharge: 2020-03-19 | Disposition: A | Discharge status: Home / Self Care | Payer: BC Managed Care – PPO | Source: Ambulatory Visit | Attending: Orthopedic Surgery | Admitting: Orthopedic Surgery

## 2020-03-19 DIAGNOSIS — Z20822 Contact with and (suspected) exposure to covid-19: Secondary | ICD-10-CM | POA: Insufficient documentation

## 2020-03-19 DIAGNOSIS — Z01812 Encounter for preprocedural laboratory examination: Secondary | ICD-10-CM | POA: Insufficient documentation

## 2020-03-19 LAB — SARS CORONAVIRUS 2 (TAT 6-24 HRS): SARS Coronavirus 2: NEGATIVE

## 2020-03-22 MED ORDER — DEXTROSE 5 % IV SOLN
3.0000 g | INTRAVENOUS | Status: AC
  Administered 2020-03-23: 3 g via INTRAVENOUS
  Filled 2020-03-22: qty 3

## 2020-03-23 ENCOUNTER — Ambulatory Visit (HOSPITAL_COMMUNITY): Payer: BC Managed Care – PPO | Admitting: Anesthesiology

## 2020-03-23 ENCOUNTER — Encounter (HOSPITAL_COMMUNITY): Payer: Self-pay | Admitting: Orthopedic Surgery

## 2020-03-23 ENCOUNTER — Encounter (HOSPITAL_COMMUNITY)
Admission: RE | Disposition: A | Discharge status: Home / Self Care | Payer: Self-pay | Source: Home / Self Care | Attending: Orthopedic Surgery

## 2020-03-23 ENCOUNTER — Ambulatory Visit (HOSPITAL_COMMUNITY): Payer: BC Managed Care – PPO

## 2020-03-23 ENCOUNTER — Ambulatory Visit (HOSPITAL_COMMUNITY)
Admission: RE | Admit: 2020-03-23 | Discharge: 2020-03-23 | Disposition: A | Discharge status: Home / Self Care | Payer: BC Managed Care – PPO | Attending: Orthopedic Surgery | Admitting: Orthopedic Surgery

## 2020-03-23 DIAGNOSIS — M109 Gout, unspecified: Secondary | ICD-10-CM | POA: Diagnosis not present

## 2020-03-23 DIAGNOSIS — Z888 Allergy status to other drugs, medicaments and biological substances status: Secondary | ICD-10-CM | POA: Diagnosis not present

## 2020-03-23 DIAGNOSIS — Z6838 Body mass index (BMI) 38.0-38.9, adult: Secondary | ICD-10-CM | POA: Diagnosis not present

## 2020-03-23 DIAGNOSIS — E669 Obesity, unspecified: Secondary | ICD-10-CM | POA: Insufficient documentation

## 2020-03-23 DIAGNOSIS — J45909 Unspecified asthma, uncomplicated: Secondary | ICD-10-CM | POA: Diagnosis not present

## 2020-03-23 DIAGNOSIS — Z471 Aftercare following joint replacement surgery: Secondary | ICD-10-CM | POA: Diagnosis not present

## 2020-03-23 DIAGNOSIS — Z419 Encounter for procedure for purposes other than remedying health state, unspecified: Secondary | ICD-10-CM

## 2020-03-23 DIAGNOSIS — Z880 Allergy status to penicillin: Secondary | ICD-10-CM | POA: Insufficient documentation

## 2020-03-23 DIAGNOSIS — M25751 Osteophyte, right hip: Secondary | ICD-10-CM | POA: Insufficient documentation

## 2020-03-23 DIAGNOSIS — M25451 Effusion, right hip: Secondary | ICD-10-CM | POA: Diagnosis not present

## 2020-03-23 DIAGNOSIS — M1611 Unilateral primary osteoarthritis, right hip: Secondary | ICD-10-CM | POA: Insufficient documentation

## 2020-03-23 DIAGNOSIS — Z96641 Presence of right artificial hip joint: Secondary | ICD-10-CM | POA: Diagnosis not present

## 2020-03-23 HISTORY — PX: TOTAL HIP ARTHROPLASTY: SHX124

## 2020-03-23 SURGERY — ARTHROPLASTY, HIP, TOTAL, ANTERIOR APPROACH
Anesthesia: Spinal | Site: Hip | Laterality: Right

## 2020-03-23 MED ORDER — LACTATED RINGERS IV BOLUS: 250.0000 mL | Freq: Once | INTRAVENOUS | Status: DC

## 2020-03-23 MED ORDER — FENTANYL CITRATE (PF) 100 MCG/2ML IJ SOLN
INTRAMUSCULAR | Status: AC
  Filled 2020-03-23: qty 2

## 2020-03-23 MED ORDER — ONDANSETRON HCL 4 MG/2ML IJ SOLN
INTRAMUSCULAR | Status: DC | PRN
  Administered 2020-03-23: 4 mg via INTRAVENOUS

## 2020-03-23 MED ORDER — LACTATED RINGERS IV SOLN: INTRAVENOUS | Status: DC

## 2020-03-23 MED ORDER — SODIUM CHLORIDE (PF) 0.9 % IJ SOLN
INTRAMUSCULAR | Status: DC | PRN
  Administered 2020-03-23: 20 mL

## 2020-03-23 MED ORDER — HYDROMORPHONE HCL 1 MG/ML IJ SOLN
INTRAMUSCULAR | Status: AC
  Filled 2020-03-23: qty 1

## 2020-03-23 MED ORDER — OXYCODONE HCL 5 MG PO TABS
5.0000 mg | ORAL_TABLET | Freq: Once | ORAL | Status: AC
  Administered 2020-03-23: 5 mg via ORAL

## 2020-03-23 MED ORDER — 0.9 % SODIUM CHLORIDE (POUR BTL) OPTIME
TOPICAL | Status: DC | PRN
  Administered 2020-03-23: 1000 mL

## 2020-03-23 MED ORDER — PROPOFOL 500 MG/50ML IV EMUL
INTRAVENOUS | Status: DC | PRN
  Administered 2020-03-23: 75 ug/kg/min via INTRAVENOUS

## 2020-03-23 MED ORDER — SODIUM CHLORIDE (PF) 0.9 % IJ SOLN
INTRAMUSCULAR | Status: AC
  Filled 2020-03-23: qty 20

## 2020-03-23 MED ORDER — OXYCODONE HCL 5 MG PO TABS
ORAL_TABLET | ORAL | Status: AC
  Filled 2020-03-23: qty 1

## 2020-03-23 MED ORDER — FENTANYL CITRATE (PF) 100 MCG/2ML IJ SOLN
25.0000 ug | INTRAMUSCULAR | Status: DC | PRN
  Administered 2020-03-23 (×3): 50 ug via INTRAVENOUS

## 2020-03-23 MED ORDER — BUPIVACAINE LIPOSOME 1.3 % IJ SUSP
10.0000 mL | Freq: Once | INTRAMUSCULAR | Status: AC
  Administered 2020-03-23: 10 mL
  Filled 2020-03-23: qty 10

## 2020-03-23 MED ORDER — MIDAZOLAM HCL 5 MG/5ML IJ SOLN
INTRAMUSCULAR | Status: DC | PRN
  Administered 2020-03-23: 2 mg via INTRAVENOUS

## 2020-03-23 MED ORDER — CHLORHEXIDINE GLUCONATE 0.12 % MT SOLN
15.0000 mL | Freq: Once | OROMUCOSAL | Status: AC
  Administered 2020-03-23: 15 mL via OROMUCOSAL

## 2020-03-23 MED ORDER — TRANEXAMIC ACID-NACL 1000-0.7 MG/100ML-% IV SOLN
INTRAVENOUS | Status: AC
  Filled 2020-03-23: qty 100

## 2020-03-23 MED ORDER — PROPOFOL 1000 MG/100ML IV EMUL
INTRAVENOUS | Status: AC
  Filled 2020-03-23: qty 100

## 2020-03-23 MED ORDER — DIPHENHYDRAMINE HCL 50 MG/ML IJ SOLN
INTRAMUSCULAR | Status: DC | PRN
  Administered 2020-03-23: 25 mg via INTRAVENOUS

## 2020-03-23 MED ORDER — WATER FOR IRRIGATION, STERILE IR SOLN
Status: DC | PRN
  Administered 2020-03-23: 2000 mL

## 2020-03-23 MED ORDER — ONDANSETRON HCL 4 MG/2ML IJ SOLN
INTRAMUSCULAR | Status: AC
  Filled 2020-03-23: qty 2

## 2020-03-23 MED ORDER — PROPOFOL 10 MG/ML IV BOLUS
INTRAVENOUS | Status: DC | PRN
  Administered 2020-03-23 (×2): 20 mg via INTRAVENOUS

## 2020-03-23 MED ORDER — ORAL CARE MOUTH RINSE: 15.0000 mL | Freq: Once | OROMUCOSAL | Status: AC

## 2020-03-23 MED ORDER — LACTATED RINGERS IV BOLUS
500.0000 mL | Freq: Once | INTRAVENOUS | Status: AC
  Administered 2020-03-23: 500 mL via INTRAVENOUS

## 2020-03-23 MED ORDER — OXYCODONE HCL 5 MG/5ML PO SOLN: 5.0000 mg | Freq: Once | ORAL | Status: AC | PRN

## 2020-03-23 MED ORDER — DEXAMETHASONE SODIUM PHOSPHATE 10 MG/ML IJ SOLN
INTRAMUSCULAR | Status: AC
  Filled 2020-03-23: qty 1

## 2020-03-23 MED ORDER — DEXAMETHASONE SODIUM PHOSPHATE 10 MG/ML IJ SOLN
INTRAMUSCULAR | Status: DC | PRN
  Administered 2020-03-23: 10 mg via INTRAVENOUS

## 2020-03-23 MED ORDER — HYDROMORPHONE HCL 1 MG/ML IJ SOLN
0.2500 mg | INTRAMUSCULAR | Status: DC | PRN
  Administered 2020-03-23 (×4): 0.5 mg via INTRAVENOUS

## 2020-03-23 MED ORDER — PROPOFOL 10 MG/ML IV BOLUS
INTRAVENOUS | Status: AC
  Filled 2020-03-23: qty 20

## 2020-03-23 MED ORDER — LIDOCAINE HCL (PF) 2 % IJ SOLN
INTRAMUSCULAR | Status: DC | PRN
  Administered 2020-03-23: 50 mg via INTRADERMAL

## 2020-03-23 MED ORDER — PROPOFOL 500 MG/50ML IV EMUL
INTRAVENOUS | Status: AC
  Filled 2020-03-23: qty 50

## 2020-03-23 MED ORDER — OXYCODONE HCL 5 MG PO TABS
5.0000 mg | ORAL_TABLET | Freq: Once | ORAL | Status: AC | PRN
  Administered 2020-03-23: 5 mg via ORAL

## 2020-03-23 MED ORDER — PHENYLEPHRINE HCL (PRESSORS) 10 MG/ML IV SOLN
INTRAVENOUS | Status: AC
  Filled 2020-03-23: qty 1

## 2020-03-23 MED ORDER — MIDAZOLAM HCL 2 MG/2ML IJ SOLN
INTRAMUSCULAR | Status: AC
  Filled 2020-03-23: qty 2

## 2020-03-23 MED ORDER — METHOCARBAMOL 500 MG IVPB - SIMPLE MED
500.0000 mg | Freq: Four times a day (QID) | INTRAVENOUS | Status: DC | PRN
  Administered 2020-03-23: 500 mg via INTRAVENOUS

## 2020-03-23 MED ORDER — CEFAZOLIN SODIUM-DEXTROSE 2-4 GM/100ML-% IV SOLN
INTRAVENOUS | Status: AC
  Filled 2020-03-23: qty 100

## 2020-03-23 MED ORDER — CEFAZOLIN SODIUM-DEXTROSE 2-4 GM/100ML-% IV SOLN
2.0000 g | Freq: Four times a day (QID) | INTRAVENOUS | Status: DC
  Administered 2020-03-23: 2 g via INTRAVENOUS

## 2020-03-23 MED ORDER — PROMETHAZINE HCL 25 MG/ML IJ SOLN: 6.2500 mg | INTRAMUSCULAR | Status: DC | PRN

## 2020-03-23 MED ORDER — DIPHENHYDRAMINE HCL 50 MG/ML IJ SOLN
INTRAMUSCULAR | Status: AC
  Filled 2020-03-23: qty 1

## 2020-03-23 MED ORDER — ACETAMINOPHEN 500 MG PO TABS
1000.0000 mg | ORAL_TABLET | Freq: Once | ORAL | Status: AC
  Administered 2020-03-23: 1000 mg via ORAL
  Filled 2020-03-23: qty 2

## 2020-03-23 MED ORDER — METHOCARBAMOL 500 MG IVPB - SIMPLE MED
INTRAVENOUS | Status: AC
  Filled 2020-03-23: qty 50

## 2020-03-23 MED ORDER — FENTANYL CITRATE (PF) 100 MCG/2ML IJ SOLN
INTRAMUSCULAR | Status: DC | PRN
  Administered 2020-03-23: 100 ug via INTRAVENOUS

## 2020-03-23 MED ORDER — BUPIVACAINE IN DEXTROSE 0.75-8.25 % IT SOLN
INTRATHECAL | Status: DC | PRN
  Administered 2020-03-23: 1.8 mL via INTRATHECAL

## 2020-03-23 MED ORDER — HYDROMORPHONE HCL 1 MG/ML IJ SOLN: 0.5000 mg | INTRAMUSCULAR | Status: DC | PRN

## 2020-03-23 MED ORDER — TRANEXAMIC ACID-NACL 1000-0.7 MG/100ML-% IV SOLN
1000.0000 mg | Freq: Once | INTRAVENOUS | Status: AC
  Administered 2020-03-23: 1000 mg via INTRAVENOUS

## 2020-03-23 MED ORDER — TRANEXAMIC ACID-NACL 1000-0.7 MG/100ML-% IV SOLN
1000.0000 mg | INTRAVENOUS | Status: AC
  Administered 2020-03-23: 1000 mg via INTRAVENOUS
  Filled 2020-03-23: qty 100

## 2020-03-23 SURGICAL SUPPLY — 45 items
APL PRP STRL LF DISP 70% ISPRP (MISCELLANEOUS) ×1
BAG SPEC THK2 15X12 ZIP CLS (MISCELLANEOUS)
BAG ZIPLOCK 12X15 (MISCELLANEOUS) IMPLANT
BLADE SAG 18X100X1.27 (BLADE) ×2 IMPLANT
BLADE SURG SZ10 CARB STEEL (BLADE) IMPLANT
CHLORAPREP W/TINT 26 (MISCELLANEOUS) ×2 IMPLANT
CLSR STERI-STRIP ANTIMIC 1/2X4 (GAUZE/BANDAGES/DRESSINGS) ×2 IMPLANT
COVER PERINEAL POST (MISCELLANEOUS) ×2 IMPLANT
COVER SURGICAL LIGHT HANDLE (MISCELLANEOUS) ×2 IMPLANT
COVER WAND RF STERILE (DRAPES) IMPLANT
DECANTER SPIKE VIAL GLASS SM (MISCELLANEOUS) ×4 IMPLANT
DRAPE IMP U-DRAPE 54X76 (DRAPES) ×2 IMPLANT
DRAPE STERI IOBAN 125X83 (DRAPES) ×2 IMPLANT
DRAPE U-SHAPE 47X51 STRL (DRAPES) ×4 IMPLANT
DRSG MEPILEX BORDER 4X8 (GAUZE/BANDAGES/DRESSINGS) ×2 IMPLANT
ELECT REM PT RETURN 15FT ADLT (MISCELLANEOUS) ×2 IMPLANT
GLOVE BIO SURGEON STRL SZ7.5 (GLOVE) ×4 IMPLANT
GLOVE BIOGEL PI IND STRL 7.5 (GLOVE) ×1 IMPLANT
GLOVE BIOGEL PI IND STRL 8 (GLOVE) ×1 IMPLANT
GLOVE BIOGEL PI INDICATOR 7.5 (GLOVE) ×1
GLOVE BIOGEL PI INDICATOR 8 (GLOVE) ×1
GOWN STRL REUS W/TWL LRG LVL3 (GOWN DISPOSABLE) ×2 IMPLANT
GOWN STRL REUS W/TWL XL LVL3 (GOWN DISPOSABLE) ×2 IMPLANT
HEAD BIOLOX HIP 36/-5 (Joint) IMPLANT
HIP BIOLOX HD 36/-5 (Joint) ×2 IMPLANT
HOLDER FOLEY CATH W/STRAP (MISCELLANEOUS) ×1 IMPLANT
INSERT TRIDENT POLY 36 0DEG (Insert) ×1 IMPLANT
KIT TURNOVER KIT A (KITS) ×1 IMPLANT
MANIFOLD NEPTUNE II (INSTRUMENTS) ×2 IMPLANT
PACK ANTERIOR HIP CUSTOM (KITS) ×2 IMPLANT
PROTECTOR NERVE ULNAR (MISCELLANEOUS) ×2 IMPLANT
SCREW HEX LP 6.5X20 (Screw) ×1 IMPLANT
SHELL ACETABUL CLUSTER SZ 54 (Shell) ×2 IMPLANT
STEM 37MM HIP (Hips) ×1 IMPLANT
SUT MNCRL AB 3-0 PS2 18 (SUTURE) ×2 IMPLANT
SUT MON AB 2-0 CT1 36 (SUTURE) ×1 IMPLANT
SUT STRATAFIX 0 PDS 27 VIOLET (SUTURE) ×2
SUT VIC AB 0 CT1 36 (SUTURE) ×2 IMPLANT
SUT VIC AB 1 CT1 36 (SUTURE) ×1 IMPLANT
SUT VIC AB 2-0 CT1 27 (SUTURE)
SUT VIC AB 2-0 CT1 TAPERPNT 27 (SUTURE) ×2 IMPLANT
SUTURE STRATFX 0 PDS 27 VIOLET (SUTURE) ×1 IMPLANT
SYR BULB EAR ULCER 3OZ GRN STR (SYRINGE) ×2 IMPLANT
TRAY FOLEY MTR SLVR 16FR STAT (SET/KITS/TRAYS/PACK) ×2 IMPLANT
TUBE SUCTION HIGH CAP CLEAR NV (SUCTIONS) ×2 IMPLANT

## 2020-03-23 NOTE — Anesthesia Procedure Notes (Signed)
Procedure Name: MAC Date/Time: 03/23/2020 10:10 AM Performed by: Lissa Morales, CRNA Pre-anesthesia Checklist: Patient identified, Emergency Drugs available, Suction available, Patient being monitored and Timeout performed Patient Re-evaluated:Patient Re-evaluated prior to induction Oxygen Delivery Method: Simple face mask Placement Confirmation: positive ETCO2

## 2020-03-23 NOTE — Anesthesia Preprocedure Evaluation (Addendum)
Anesthesia Evaluation  Patient identified by MRN, date of birth, ID band Patient awake    Reviewed: Allergy & Precautions, NPO status , Patient's Chart, lab work & pertinent test results  History of Anesthesia Complications (+) PONV  Airway Mallampati: II  TM Distance: >3 FB Neck ROM: Full   Comment: Large neck circumference Dental no notable dental hx.    Pulmonary asthma ,    Pulmonary exam normal breath sounds clear to auscultation       Cardiovascular Exercise Tolerance: Good negative cardio ROS Normal cardiovascular exam Rhythm:Regular Rate:Normal     Neuro/Psych negative neurological ROS  negative psych ROS   GI/Hepatic negative GI ROS, Neg liver ROS,   Endo/Other  obesity  Renal/GU Renal disease     Musculoskeletal  (+) Arthritis , Osteoarthritis,    Abdominal   Peds negative pediatric ROS (+)  Hematology negative hematology ROS (+)   Anesthesia Other Findings   Reproductive/Obstetrics                            Anesthesia Physical Anesthesia Plan  ASA: II  Anesthesia Plan: Spinal   Post-op Pain Management:    Induction: Intravenous  PONV Risk Score and Plan: Propofol infusion, TIVA, Treatment may vary due to age or medical condition, Ondansetron, Dexamethasone and Midazolam  Airway Management Planned: Natural Airway and Simple Face Mask  Additional Equipment:   Intra-op Plan:   Post-operative Plan:   Informed Consent: I have reviewed the patients History and Physical, chart, labs and discussed the procedure including the risks, benefits and alternatives for the proposed anesthesia with the patient or authorized representative who has indicated his/her understanding and acceptance.       Plan Discussed with: CRNA and Anesthesiologist  Anesthesia Plan Comments:         Anesthesia Quick Evaluation

## 2020-03-23 NOTE — Anesthesia Postprocedure Evaluation (Signed)
Anesthesia Post Note  Patient: Curlene Dolphin  Procedure(s) Performed: TOTAL HIP ARTHROPLASTY ANTERIOR APPROACH (Right Hip)     Patient location during evaluation: PACU Anesthesia Type: Spinal and MAC Level of consciousness: awake and alert Pain management: pain level controlled Vital Signs Assessment: post-procedure vital signs reviewed and stable Respiratory status: spontaneous breathing and respiratory function stable Cardiovascular status: blood pressure returned to baseline and stable Postop Assessment: spinal receding Anesthetic complications: no   No complications documented.  Last Vitals:  Vitals:   03/23/20 1500 03/23/20 1513  BP: 109/78 129/81  Pulse: 79 81  Resp: (!) 21 20  Temp: 36.7 C 36.7 C  SpO2: 97% 100%    Last Pain:  Vitals:   03/23/20 1545  TempSrc:   PainSc: 6                  Candra R Jennavie Martinek

## 2020-03-23 NOTE — Op Note (Signed)
03/23/2020  11:58 AM  PATIENT:  Marcus Chen   MRN: [id number]  PRE-OPERATIVE DIAGNOSIS:  OA RIGHT HIP  POST-OPERATIVE DIAGNOSIS:  OA RIGHT HIP  PROCEDURE:  Procedure(s): TOTAL HIP ARTHROPLASTY ANTERIOR APPROACH  PREOPERATIVE INDICATIONS:    Marcus Chen is an 54 y.o. male who has a diagnosis of <principal problem not specified> and elected for surgical management after failing conservative treatment.  The risks benefits and alternatives were discussed with the patient including but not limited to the risks of nonoperative treatment, versus surgical intervention including infection, bleeding, nerve injury, periprosthetic fracture, the need for revision surgery, dislocation, leg length discrepancy, blood clots, cardiopulmonary complications, morbidity, mortality, among others, and they were willing to proceed.     OPERATIVE REPORT     SURGEON:   Renette Butters, MD    ASSISTANT:  Margy Clarks, PA-C, he was present and scrubbed throughout the case, critical for completion in a timely fashion, and for retraction, instrumentation, and closure.     ANESTHESIA:  General    COMPLICATIONS:  None.     COMPONENTS:  [employer]ker acolade fit femur size 7 with a 36 mm -5 head ball and an acetabular shell size 54 with a  polyethylene liner    PROCEDURE IN DETAIL:   The patient was met in the holding area and  identified.  The appropriate hip was identified and marked at the operative site.  The patient was then transported to the OR  and  placed under anesthesia per that record.  At that point, the patient was  placed in the supine position and  secured to the operating room table and all bony prominences padded. He received pre-operative antibiotics    The operative lower extremity was prepped from the iliac crest to the distal leg.  Sterile draping was performed.  Time out was performed prior to incision.      Skin incision was made just 2 cm lateral to the ASIS  extending in line with the  tensor fascia lata. Electrocautery was used to control all bleeders. I dissected down sharply to the fascia of the tensor fascia lata was confirmed that the muscle fibers beneath were running posteriorly. I then incised the fascia over the superficial tensor fascia lata in line with the incision. The fascia was elevated off the anterior aspect of the muscle the muscle was retracted posteriorly and protected throughout the case. I then used electrocautery to incise the tensor fascia lata fascia control and all bleeders. Immediately visible was the fat over top of the anterior neck and capsule.  I removed the anterior fat from the capsule and elevated the rectus muscle off of the anterior capsule. I then removed a large time of capsule. The retractors were then placed over the anterior acetabulum as well as around the superior and inferior neck.  I then made a femoral neck cut. Then used the power corkscrew to remove the femoral head from the acetabulum and thoroughly irrigated the acetabulum. I sized the femoral head.    I then exposed the deep acetabulum, cleared out any tissue including the ligamentum teres.   After adequate visualization, I excised the labrum, and then sequentially reamed.  I then impacted the acetabular implant into place using fluoroscopy for guidance.  Appropriate version and inclination was confirmed clinically matching their bony anatomy, and with fluoroscopy.  I placed a 20 mm screw in the posterior/superio position with an excellent bite.    I then placed the polyethylene liner in  place  I then adducted the leg and released the external rotators from the posterior femur allowing it to be easily delivered up lateral and anterior to the acetabulum for preparation of the femoral canal.    I then prepared the proximal femur using the cookie-cutter and then sequentially reamed and broached.  A trial broach, neck, and head was utilized, and I reduced the hip and used floroscopy to  assess the neck length and femoral implant.  I then impacted the femoral prosthesis into place into the appropriate version. The hip was then reduced and fluoroscopy confirmed appropriate position. Leg lengths were restored.  I then irrigated the hip copiously again with, and repaired the fascia with Vicryl, followed by monocryl for the subcutaneous tissue, Monocryl for the skin, Steri-Strips and sterile gauze. The patient was then awakened and returned to PACU in stable and satisfactory condition. There were no complications.  POST OPERATIVE PLAN: WBAT, DVT px: SCD's/TED, ambulation and chemical dvt px  [name], MD Orthopedic Surgeon [phone]     

## 2020-03-23 NOTE — Transfer of Care (Signed)
Immediate Anesthesia Transfer of Care Note  Patient: Marcus Chen  Procedure(s) Performed: TOTAL HIP ARTHROPLASTY ANTERIOR APPROACH (Right Hip)  Patient Location: PACU  Anesthesia Type:Spinal  Level of Consciousness: awake, alert  and patient cooperative  Airway & Oxygen Therapy: Patient Spontanous Breathing and Patient connected to face mask oxygen  Post-op Assessment: Report given to RN and Post -op Vital signs reviewed and stable  Post vital signs: stable  Last Vitals:  Vitals Value Taken Time  BP    Temp    Pulse    Resp    SpO2      Last Pain:  Vitals:   03/23/20 0816  TempSrc: Oral         Complications: No complications documented.

## 2020-03-23 NOTE — Discharge Instructions (Signed)
You may bear weight as tolerated. Keep your dressing on and dry until follow up. Take medicine to prevent blood clots as directed. Take pain medicine as needed with the goal of transitioning to over the counter medicines.  If needed, you may increase breakthrough pain medication (oxycodone) for the first few days post op - up to 2 tablets every 4 hours.  Stop this medication as soon as you are able.  INSTRUCTIONS AFTER JOINT REPLACEMENT   o Remove items at home which could result in a fall. This includes throw rugs or furniture in walking pathways o ICE to the affected joint every three hours while awake for 30 minutes at a time, for at least the first 3-5 days, and then as needed for pain and swelling.  Continue to use ice for pain and swelling. You may notice swelling that will progress down to the foot and ankle.  This is normal after surgery.  Elevate your leg when you are not up walking on it.   o Continue to use the breathing machine you got in the hospital (incentive spirometer) which will help keep your temperature down.  It is common for your temperature to cycle up and down following surgery, especially at night when you are not up moving around and exerting yourself.  The breathing machine keeps your lungs expanded and your temperature down.   DIET:  As you were doing prior to hospitalization, we recommend a well-balanced diet.  DRESSING / WOUND CARE / SHOWERING  You may shower 3 days after surgery, but keep the wounds dry during showering.  You may use an occlusive plastic wrap (Press'n Seal for example) with blue painter's tape at edges, NO SOAKING/SUBMERGING IN THE BATHTUB.  If the bandage gets wet, change with a clean dry gauze.  If the incision gets wet, pat the wound dry with a clean towel.  ACTIVITY  o Increase activity slowly as tolerated, but follow the weight bearing instructions below.   o No driving for 6 weeks or until further direction given by your physician.  You  cannot drive while taking narcotics.  o No lifting or carrying greater than 10 lbs. until further directed by your surgeon. o Avoid periods of inactivity such as sitting longer than an hour when not asleep. This helps prevent blood clots.  o You may return to work once you are authorized by your doctor.     WEIGHT BEARING   Weight bearing as tolerated with assist device (walker, cane, etc) as directed, use it as long as suggested by your surgeon or therapist, typically at least 4-6 weeks.   EXERCISES  Results after joint replacement surgery are often greatly improved when you follow the exercise, range of motion and muscle strengthening exercises prescribed by your doctor. Safety measures are also important to protect the joint from further injury. Any time any of these exercises cause you to have increased pain or swelling, decrease what you are doing until you are comfortable again and then slowly increase them. If you have problems or questions, call your caregiver or physical therapist for advice.   Rehabilitation is important following a joint replacement. After just a few days of immobilization, the muscles of the leg can become weakened and shrink (atrophy).  These exercises are designed to build up the tone and strength of the thigh and leg muscles and to improve motion. Often times heat used for twenty to thirty minutes before working out will loosen up your tissues and help with   improving the range of motion but do not use heat for the first two weeks following surgery (sometimes heat can increase post-operative swelling).   These exercises can be done on a training (exercise) mat, on the floor, on a table or on a bed. Use whatever works the best and is most comfortable for you.    Use music or television while you are exercising so that the exercises are a pleasant break in your day. This will make your life better with the exercises acting as a break in your routine that you can look  forward to.   Perform all exercises about fifteen times, three times per day or as directed.  You should exercise both the operative leg and the other leg as well.  Exercises include:   . Quad Sets - Tighten up the muscle on the front of the thigh (Quad) and hold for 5-10 seconds.   . Straight Leg Raises - With your knee straight (if you were given a brace, keep it on), lift the leg to 60 degrees, hold for 3 seconds, and slowly lower the leg.  Perform this exercise against resistance later as your leg gets stronger.  . Leg Slides: Lying on your back, slowly slide your foot toward your buttocks, bending your knee up off the floor (only go as far as is comfortable). Then slowly slide your foot back down until your leg is flat on the floor again.  . Angel Wings: Lying on your back spread your legs to the side as far apart as you can without causing discomfort.  . Hamstring Strength:  Lying on your back, push your heel against the floor with your leg straight by tightening up the muscles of your buttocks.  Repeat, but this time bend your knee to a comfortable angle, and push your heel against the floor.  You may put a pillow under the heel to make it more comfortable if necessary.   A rehabilitation program following joint replacement surgery can speed recovery and prevent re-injury in the future due to weakened muscles. Contact your doctor or a physical therapist for more information on knee rehabilitation.    CONSTIPATION  Constipation is defined medically as fewer than three stools per week and severe constipation as less than one stool per week.  Even if you have a regular bowel pattern at home, your normal regimen is likely to be disrupted due to multiple reasons following surgery.  Combination of anesthesia, postoperative narcotics, change in appetite and fluid intake all can affect your bowels.   YOU MUST use at least one of the following options; they are listed in order of increasing strength  to get the job done.  They are all available over the counter, and you may need to use some, POSSIBLY even all of these options:    Drink plenty of fluids (prune juice may be helpful) and high fiber foods Colace 100 mg by mouth twice a day  Senokot for constipation as directed and as needed Dulcolax (bisacodyl), take with full glass of water  Miralax (polyethylene glycol) once or twice a day as needed.  If you have tried all these things and are unable to have a bowel movement in the first 3-4 days after surgery call either your surgeon or your primary doctor.    If you experience loose stools or diarrhea, hold the medications until you stool forms back up.  If your symptoms do not get better within 1 week or if they get worse,   check with your doctor.  If you experience "the worst abdominal pain ever" or develop nausea or vomiting, please contact the office immediately for further recommendations for treatment.   ITCHING:  If you experience itching with your medications, try taking only a single pain pill, or even half a pain pill at a time.  You can also use Benadryl over the counter for itching or also to help with sleep.   TED HOSE STOCKINGS:  Use stockings on both legs until for at least 2 weeks or as directed by physician office. They may be removed at night for sleeping.  MEDICATIONS:  See your medication summary on the "After Visit Summary" that nursing will review with you.  You may have some home medications which will be placed on hold until you complete the course of blood thinner medication.  It is important for you to complete the blood thinner medication as prescribed.  PRECAUTIONS:  If you experience chest pain or shortness of breath - call 911 immediately for transfer to the hospital emergency department.   If you develop a fever greater that 101 F, purulent drainage from wound, increased redness or drainage from wound, foul odor from the wound/dressing, or calf pain - CONTACT  YOUR SURGEON.                                                   FOLLOW-UP APPOINTMENTS:  If you do not already have a post-op appointment, please call the office for an appointment to be seen by your surgeon.  Guidelines for how soon to be seen are listed in your "After Visit Summary", but are typically between 1-4 weeks after surgery.  OTHER INSTRUCTIONS:  Dental Antibiotics:  In most cases prophylactic antibiotics for Dental procdeures after total joint surgery are not necessary.  Exceptions are as follows:  1. History of prior total joint infection  2. Severely immunocompromised (Organ Transplant, cancer chemotherapy, Rheumatoid biologic meds such as Humera)  3. Poorly controlled diabetes (A1C &gt; 8.0, blood glucose over 200)  If you have one of these conditions, contact your surgeon for an antibiotic prescription, prior to your dental procedure.   MAKE SURE YOU:  . Understand these instructions.  . Get help right away if you are not doing well or get worse.    Thank you for letting us be a part of your medical care team.  It is a privilege we respect greatly.  We hope these instructions will help you stay on track for a fast and full recovery!     

## 2020-03-23 NOTE — Interval H&P Note (Signed)
History and Physical Interval Note:  03/23/2020 8:51 AM  Marcus Chen  has presented today for surgery, with the diagnosis of OA RIGHT HIP.  The various methods of treatment have been discussed with the patient and family. After consideration of risks, benefits and other options for treatment, the patient has consented to  Procedure(s): TOTAL HIP ARTHROPLASTY ANTERIOR APPROACH (Right) as a surgical intervention.  The patient's history has been reviewed, patient examined, no change in status, stable for surgery.  I have reviewed the patient's chart and labs.  Questions were answered to the patient's satisfaction.     Renette Butters

## 2020-03-23 NOTE — Evaluation (Signed)
Physical Therapy Evaluation Patient Details Name: Marcus Chen MRN: [id number] DOB: 1966-05-18 Today's Date: 03/23/2020   History of Present Illness  Patient is 54 y.o. male s/p Rt THA anterior approach on 03/23/20 with PMH significant for Asthma, OA, gout.  Clinical Impression  Marcus Chen is a 54 y.o. male POD 0 s/p Rt THA. Patient reports independence with mobility at baseline. Patient is now limited by functional impairments (see PT problem list below) and requires min guard/supervision for transfers and gait with RW. Patient was able to ambulate ~100 feet with RW and supervision and cues for safe walker management. Patient educated on safe sequencing for stair mobility and verbalized safe guarding position for people assisting with mobility. Patient instructed in exercises to facilitate ROM and circulation. Patient will benefit from continued skilled PT interventions to address impairments and progress towards PLOF. Patient has met mobility goals at adequate level for discharge home; will continue to follow if pt continues acute stay to progress towards Mod I goals.     Follow Up Recommendations Follow surgeon's recommendation for DC plan and follow-up therapies;Outpatient PT    Equipment Recommendations  Rolling walker with 5" wheels;3in1 (PT) (delivered in PACU)    Recommendations for Other Services       Precautions / Restrictions Precautions Precautions: Fall Restrictions Weight Bearing Restrictions: No Other Position/Activity Restrictions: WBAT      Mobility  Bed Mobility Overal bed mobility: Needs Assistance Bed Mobility: Supine to Sit     Supine to sit: Min guard     General bed mobility comments: cues for use of belt to assist with Rt LE mobility to EOB, pt with increased effort and time to sit up. limited by pain.    Transfers Overall transfer level: Needs assistance Equipment used: Rolling walker (2 wheeled) Transfers: Sit to/from Stand Sit to Stand: Min  guard;Supervision         General transfer comment: cues for hand placement and technique with RW, no assist required, min guard initially for safety.  Ambulation/Gait Ambulation/Gait assistance: Min guard;Supervision Gait Distance (Feet): 100 Feet Assistive device: Rolling walker (2 wheeled) Gait Pattern/deviations: Step-to pattern;Decreased stride length;Decreased weight shift to right Gait velocity: decr   General Gait Details: cues for step pattern and proximity to RW as well as to keep walker on floor. pt progressed to supervision level throughout. no overt LOB noted during gait. pt limited with Rt LE advancement by pain.  Stairs Stairs: Yes Stairs assistance: Min guard Stair Management: Two rails;One rail Right;Sideways;Forwards Number of Stairs: 5 (1x3, 1x2) General stair comments: Cues for safe step sequencing "up with good, down with bad". No overt LOB noted. pt completed 2 hand rail technique and side step technique with one rail. pt verbalized safe guarding position for family to provide.  Wheelchair Mobility    Modified Rankin (Stroke Patients Only)       Balance Overall balance assessment: Mild deficits observed, not formally tested;Needs assistance Sitting-balance support: Feet supported Sitting balance-Leahy Scale: Good     Standing balance support: During functional activity;Bilateral upper extremity supported Standing balance-Leahy Scale: Fair                               Pertinent Vitals/Pain Pain Assessment: 0-10 Pain Score: 8  Pain Location: Rt hip Pain Descriptors / Indicators: Aching;Discomfort;Grimacing Pain Intervention(s): Limited activity within patient's tolerance;Premedicated before session;Monitored during session;Repositioned;Ice applied    Home Living Family/patient expects to be discharged  to:: Private residence Living Arrangements: Spouse/significant other Available Help at Discharge: Family Type of Home: House Home  Access: Stairs to enter Entrance Stairs-Rails: Right;Left;Can reach both Entrance Stairs-Number of Steps: [place]: Two level;Full bath on main level;Able to live on main level with bedroom/bathroom Home Equipment: None Additional Comments: pt reports he can reach both at side entrance    Prior Function Level of Independence: Independent               Hand Dominance   Dominant Hand: Right    Extremity/Trunk Assessment   Upper Extremity Assessment Upper Extremity Assessment: Overall WFL for tasks assessed    Lower Extremity Assessment Lower Extremity Assessment: Overall WFL for tasks assessed    Cervical / Trunk Assessment Cervical / Trunk Assessment: Normal  Communication   Communication: No difficulties  Cognition Arousal/Alertness: Awake/alert Behavior During Therapy: WFL for tasks assessed/performed Overall Cognitive Status: Within Functional Limits for tasks assessed                                        General Comments      Exercises Total Joint Exercises Ankle Circles/Pumps: AROM;Both;15 reps;Seated Quad Sets: AROM;Right;5 reps;Seated Heel Slides: AAROM;Right;Other reps (comment);Seated (1, limited by pain) Long Arc Quad: AROM;Right;5 reps;Seated   Assessment/Plan    PT Assessment Patient needs continued PT services  PT Problem List Decreased strength;Decreased range of motion;Decreased balance;Decreased activity tolerance;Decreased mobility;Decreased knowledge of use of DME;Decreased knowledge of precautions;Pain       PT Treatment Interventions DME instruction;Gait training;Stair training;Functional mobility training;Therapeutic activities;Therapeutic exercise;Balance training;Patient/family education    PT Goals (Current goals can be found in the Care Plan section)  Acute Rehab PT Goals Patient Stated Goal: recover to regain independence and go home today PT Goal Formulation: With patient Time For Goal Achievement:  03/30/20 Potential to Achieve Goals: Good    Frequency 7X/week   Barriers to discharge        Co-evaluation               AM-PAC PT "6 Clicks" Mobility  Outcome Measure Help needed turning from your back to your side while in a flat bed without using bedrails?: A Little Help needed moving from lying on your back to sitting on the side of a flat bed without using bedrails?: A Little Help needed moving to and from a bed to a chair (including a wheelchair)?: A Little Help needed standing up from a chair using your arms (e.g., wheelchair or bedside chair)?: A Little Help needed to walk in hospital room?: A Little Help needed climbing 3-5 steps with a railing? : A Little 6 Click Score: 18    End of Session Equipment Utilized During Treatment: Gait belt Activity Tolerance: Patient tolerated treatment well;Patient limited by pain Patient left: in chair;with call bell/phone within reach Nurse Communication: Mobility status PT Visit Diagnosis: Muscle weakness (generalized) (M62.81);Difficulty in walking, not elsewhere classified (R26.2);Pain Pain - Right/Left: Right Pain - part of body: Hip    Time: 2426-8341 PT Time Calculation (min) (ACUTE ONLY): 38 min   Charges:   PT Evaluation $PT Eval Low Complexity: 1 Low PT Treatments $Gait Training: 8-22 mins $Therapeutic Exercise: 8-22 mins        Verner Mould, DPT Acute Rehabilitation Services  Office [phone] Pager [phone]  03/23/2020 5:06 PM

## 2020-03-23 NOTE — Anesthesia Procedure Notes (Signed)
Spinal  Patient location during procedure: OR Start time: 03/23/2020 10:15 AM End time: 03/23/2020 10:20 AM Staffing Performed: anesthesiologist  Anesthesiologist: Merlinda Frederick, MD Preanesthetic Checklist Completed: patient identified, IV checked, risks and benefits discussed, surgical consent, monitors and equipment checked, pre-op evaluation and timeout performed Spinal Block Patient position: sitting Prep: DuraPrep Patient monitoring: cardiac monitor, continuous pulse ox and blood pressure Approach: midline Location: L3-4 Injection technique: single-shot Needle Needle type: Pencan  Needle gauge: 24 G Needle length: 9 cm Additional Notes Functioning IV was confirmed and monitors were applied. Sterile prep and drape, including hand hygiene and sterile gloves were used. The patient was positioned and the spine was prepped. The skin was anesthetized with lidocaine.  Free flow of clear CSF was obtained prior to injecting local anesthetic into the CSF.  The spinal needle aspirated freely following injection.  The needle was carefully withdrawn.  The patient tolerated the procedure well.

## 2020-03-24 ENCOUNTER — Encounter (HOSPITAL_COMMUNITY): Payer: Self-pay | Admitting: Physical Therapy

## 2020-03-24 ENCOUNTER — Other Ambulatory Visit: Payer: Self-pay

## 2020-03-24 ENCOUNTER — Ambulatory Visit (HOSPITAL_COMMUNITY): Payer: BC Managed Care – PPO | Attending: Orthopedic Surgery | Admitting: Physical Therapy

## 2020-03-24 DIAGNOSIS — M25551 Pain in right hip: Secondary | ICD-10-CM | POA: Diagnosis not present

## 2020-03-24 DIAGNOSIS — M25651 Stiffness of right hip, not elsewhere classified: Secondary | ICD-10-CM | POA: Diagnosis not present

## 2020-03-24 DIAGNOSIS — R2689 Other abnormalities of gait and mobility: Secondary | ICD-10-CM | POA: Insufficient documentation

## 2020-03-24 DIAGNOSIS — R262 Difficulty in walking, not elsewhere classified: Secondary | ICD-10-CM | POA: Insufficient documentation

## 2020-03-24 NOTE — Therapy (Signed)
[place], [place], [address] Phone: [phone]   Fax:  [phone]  Physical Therapy Evaluation  Patient Details  Name: Marcus Chen MRN: [id number] Date of Birth: 12/04/1965 Referring Provider (PT): Edmonia Lynch   Encounter Date: 03/24/2020   PT End of Session - 03/24/20 1315    Visit Number 1    Number of Visits 18    Date for PT Re-Evaluation 05/05/20    Authorization Type BCBS VL 30, no auth, secondary, UHC VL 60 no auth    Authorization - Visit Number 1    Authorization - Number of Visits 30    PT Start Time 0630    PT Stop Time 1351    PT Time Calculation (min) 36 min           Past Medical History:  Diagnosis Date  . AC (acromioclavicular) joint bone spurs    spurs  . Allergy   . Arthritis   . Asthma    season,pet alergies  . Gout   . History of kidney stones   . Kidney stone   . Post-operative nausea and vomiting    nausea and vomitting  . Shoulder pain, bilateral     Past Surgical History:  Procedure Laterality Date  . APPENDECTOMY    . ROTATOR CUFF REPAIR  2014   right/ left shoulder was done 2010  . stomach reduction  2003  . TONSILLECTOMY      There were no vitals filed for this visit.    Subjective Assessment - 03/24/20 1322    Subjective States he had surgery yesterday and overall is feeling like he is improving each day.  Current pain is 2/10 in right hip with movement. States he is learning to walk with the walker. Prior to surgery he was having 5/10 constant pain. States prior to the surgery he was not using any AD states that his right knee actually started bothering him. Prior to surgery he was unable to rotate his hip or bending at the waist so he couldn't bend over and pick up items.    Currently in Pain? Yes    Pain Score 2     Pain Location Hip    Pain Orientation Right    Pain Descriptors / Indicators Aching;Dull    Pain Type Surgical pain    Pain Frequency  Intermittent    Aggravating Factors  with movement    Pain Relieving Factors pain meds, rest,              [facility] PT Assessment - 03/24/20 0001      Assessment   Medical Diagnosis R THA anterior approach    Referring Provider (PT) Edmonia Lynch    Onset Date/Surgical Date 03/23/20    Next MD Visit 03/31/20    Prior Therapy yes      Restrictions   Other Position/Activity Restrictions [place] residence      Prior Function   Level of Independence Independent    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     Cognition   Overall Cognitive Status Within Functional Limits for tasks assessed      Observation/Other Assessments   Focus on Therapeutic Outcomes (FOTO)  4% function       ROM / Strength   AROM /  PROM / Strength AROM      AROM   AROM Assessment Site Knee;Hip    Right/Left Hip Right    Right Hip Flexion 75   pain    Right Hip External Rotation  5   pain   Right Hip Internal Rotation  0   pain    Right/Left Knee Right    Right Knee Extension 20   lacking - can't fully extend secondary to hip pain      Bed Mobility   Bed Mobility Sit to Supine    Sit to Supine Minimal Assistance - Patient > 75%   with right leg     Transfers   Transfers Sit to Stand;Stand to Sit    Sit to Stand 6: Modified independent (Device/Increase time);With upper extremity assist    Stand to Sit 6: Modified independent (Device/Increase time);With upper extremity assist      Ambulation/Gait   Ambulation/Gait Yes    Ambulation/Gait Assistance 5: Supervision    Ambulation Distance (Feet) 90 Feet    Assistive device Rolling walker    Gait Pattern Step-to pattern;Decreased stance time - right;Decreased stride length;Decreased hip/knee flexion - right   transitioned to step through during    Ambulation Surface Level;Indoor    Gait velocity decreased    Gait Comments 2MW                        Objective measurements completed on examination: See above findings.       [facility] Adult PT Treatment/Exercise - 03/24/20 0001      Exercises   Exercises Knee/Hip      Knee/Hip Exercises: Supine   Other Supine Knee/Hip Exercises hip circles R passive in hook lying 3 minutes - felt good    Other Supine Knee/Hip Exercises hip add and abd isometrics x15 5" holds Each                  PT Education - 03/24/20 1354    Education Details on HEP, on rehab and POC    Person(s) Educated Patient    Methods Explanation    Comprehension Verbalized understanding            PT Short Term Goals - 03/24/20 1356      PT SHORT TERM GOAL #1   Title Patient will be able to walk at least 226 feet in 2 minutes with no assistive device to demonstrate improved functional mobility    Time 3    Period Weeks    Status New    Target Date 04/14/20      PT SHORT TERM GOAL #2   Title Patient will report at least 50% improvement in overall symptoms and/or function to demonstrate improved functional mobility    Time 3    Period Weeks    Status New    Target Date 04/14/20      PT SHORT TERM GOAL #3   Title Patient will be independent in self management strategies to improve quality of life and functional outcomes.    Time 3    Period Weeks    Status New    Target Date 04/14/20             PT Long Term Goals - 03/24/20 1357      PT LONG TERM GOAL #1   Title Patient will be able to pick up light box off the floor with good mechanics without pain to improve ability to lift  items as needed    Time 6    Period Weeks    Status New    Target Date 05/05/20      PT LONG TERM GOAL #2   Title Patient will be able to ascend and descend flight of stairs with step through gait pattern with railing as needed to improve ability to get to second floor in home    Time 6    Period Weeks    Status New    Target Date 05/05/20      PT LONG TERM GOAL #3   Title  Patient will improve on FOTO score to meet predicted outcomes to demonstrate improved functional mobility.    Time 6    Period Weeks    Status New    Target Date 05/05/20                  Plan - 03/24/20 1348    Clinical Impression Statement Patient is s/p R anterior approach THA with surgery date 03/23/20.  Patient presents with limitations in ROM, functional movements and is overall pain limited at this time which is expected post op day one. Educated patient in basic HEP, movement and resting as needed. Patient would greatly benefit from skilled physical therapy to improve functional mobility and return him to optimal function.    Personal Factors and Comorbidities Comorbidity 1    Comorbidities HTN    Examination-Activity Limitations Bend;Carry;Stand;Transfers;Lift;Locomotion Level;Reach Overhead;Stairs;Squat;Sleep;Bed Mobility;Bathing;Sit    Examination-Participation Restrictions Cleaning;Driving;Meal Prep;Occupation;Yard Work;Shop    Stability/Clinical Decision Making Stable/Uncomplicated    Clinical Decision Making Low    Rehab Potential Excellent    PT Frequency 3x / week    PT Duration 6 weeks    PT Treatment/Interventions ADLs/Self Care Home Management;Aquatic Therapy;Cryotherapy;Electrical Stimulation;Moist Heat;Traction;Balance training;Therapeutic exercise;Therapeutic activities;Functional mobility training;Stair training;Gait training;DME Instruction;Neuromuscular re-education;Patient/family education;Manual techniques;Dry needling;Passive range of motion;Scar mobilization;Joint Manipulations    PT Next Visit Plan hip PROM--> AAROM, isometrics transitional mobility and walking    PT Home Exercise Plan texted to pt from medbridge- hip add/abd isometrics, LAQs, quad sets, ankle pumps    Consulted and Agree with Plan of Care Patient           Patient will benefit from skilled therapeutic intervention in order to improve the following deficits and impairments:  Pain,  Decreased activity tolerance, Decreased mobility, Decreased skin integrity, Abnormal gait, Increased edema, Decreased strength, Difficulty walking, Decreased balance, Decreased range of motion  Visit Diagnosis: Pain in right hip  Stiffness of right hip, not elsewhere classified  Difficulty in walking, not elsewhere classified     Problem List Patient Active Problem List   Diagnosis Date Noted  . S/P rotator cuff repair 02/10/2014  . Pain in joint, shoulder region 02/10/2014  . Muscle weakness (generalized) 02/10/2014  . Decreased range of motion of right shoulder 02/10/2014   2:05 PM, 03/24/20 Jerene Pitch, DPT Physical Therapy with [facility]  [phone] office  [place] [address] [place], [place], [address] Phone: [phone]   Fax:  [phone]  Name: MICHAELJAMES MILNES MRN: [id number] Date of Birth: [birth year]

## 2020-03-24 NOTE — Patient Instructions (Signed)
Access Code: [email or web address] URL: [email or web address] Date: 03/24/2020 Prepared by: Yetta Glassman  Exercises Supine Hip Adduction Isometric with Diona Foley - 1 x daily - 7 x weekly - 2 sets - 15 reps - 5 hold Hooklying Clamshell with Resistance - 1 x daily - 7 x weekly - 2 sets - 15 reps - 5 hold Seated Long Arc Quad - 1 x daily - 7 x weekly - 3 sets - 10 reps Supine Quad Set - 1 x daily - 7 x weekly - 3 sets - 10 reps Seated Ankle Pumps - 1 x daily - 7 x weekly - 3 sets - 10 reps

## 2020-03-26 ENCOUNTER — Other Ambulatory Visit: Payer: Self-pay

## 2020-03-26 ENCOUNTER — Ambulatory Visit (HOSPITAL_COMMUNITY): Payer: BC Managed Care – PPO | Admitting: Physical Therapy

## 2020-03-26 DIAGNOSIS — M25551 Pain in right hip: Secondary | ICD-10-CM | POA: Diagnosis not present

## 2020-03-26 DIAGNOSIS — R262 Difficulty in walking, not elsewhere classified: Secondary | ICD-10-CM

## 2020-03-26 DIAGNOSIS — R2689 Other abnormalities of gait and mobility: Secondary | ICD-10-CM | POA: Diagnosis not present

## 2020-03-26 DIAGNOSIS — M25651 Stiffness of right hip, not elsewhere classified: Secondary | ICD-10-CM | POA: Diagnosis not present

## 2020-03-26 NOTE — Therapy (Signed)
[place], [place], [address] Phone: [phone]   Fax:  [phone]  Physical Therapy Treatment  Patient Details  Name: Marcus Chen MRN: [id number] Date of Birth: [birth year] Referring Provider (PT): Edmonia Lynch   Encounter Date: 03/26/2020   PT End of Session - 03/26/20 1309    Visit Number 2    Number of Visits 18    Date for PT Re-Evaluation 05/05/20    Authorization Type BCBS VL 30, no auth, secondary, UHC VL 60 no auth    Authorization - Visit Number 2    Authorization - Number of Visits 30    PT Start Time 1310    PT Stop Time 1350    PT Time Calculation (min) 40 min    Activity Tolerance Patient tolerated treatment well    Behavior During Therapy WFL for tasks assessed/performed           Past Medical History:  Diagnosis Date   AC (acromioclavicular) joint bone spurs    spurs   Allergy    Arthritis    Asthma    season,pet alergies   Gout    History of kidney stones    Kidney stone    Post-operative nausea and vomiting    nausea and vomitting   Shoulder pain, bilateral     Past Surgical History:  Procedure Laterality Date   APPENDECTOMY     ROTATOR CUFF REPAIR  2014   right/ left shoulder was done 2010   stomach reduction  2003   TONSILLECTOMY     TOTAL HIP ARTHROPLASTY Right 03/23/2020   Procedure: TOTAL HIP ARTHROPLASTY ANTERIOR APPROACH;  Surgeon: Renette Butters, MD;  Location: [facility];  Service: Orthopedics;  Laterality: Right;    There were no vitals filed for this visit.   Subjective Assessment - 03/26/20 1310    Subjective Patient states burning in thigh. He has been up walking every few hours. He feels that his leg might be warmer. He is aware of signs of infection/DVT.    Currently in Pain? Yes    Pain Score 3     Pain Location Hip    Pain Orientation Right    Pain Descriptors / Indicators Nagging    Pain Type Surgical pain                              [facility] Adult PT Treatment/Exercise - 03/26/20 0001      Knee/Hip Exercises: Standing   Heel Raises 1 set;15 reps    Hip Abduction AROM;Both;10 reps;2 sets    Abduction Limitations cueing for glute activation      Knee/Hip Exercises: Supine   Heel Slides 5 reps    Heel Slides Limitations 10 second holds with strap    Bridges Both;2 sets;10 reps    Other Supine Knee/Hip Exercises marching 2x5  bilateral     Other Supine Knee/Hip Exercises glute set 5x 5 second holds,       Knee/Hip Exercises: Sidelying   Clams 3x5                  PT Education - 03/26/20 1309    Education Details HEP, exercise mechanics, self stm with roller stick    Person(s) Educated Patient    Methods Explanation;Demonstration    Comprehension Verbalized understanding;Returned demonstration            PT Short Term Goals -  03/24/20 1356      PT SHORT TERM GOAL #1   Title Patient will be able to walk at least 226 feet in 2 minutes with no assistive device to demonstrate improved functional mobility    Time 3    Period Weeks    Status New    Target Date 04/14/20      PT SHORT TERM GOAL #2   Title Patient will report at least 50% improvement in overall symptoms and/or function to demonstrate improved functional mobility    Time 3    Period Weeks    Status New    Target Date 04/14/20      PT SHORT TERM GOAL #3   Title Patient will be independent in self management strategies to improve quality of life and functional outcomes.    Time 3    Period Weeks    Status New    Target Date 04/14/20             PT Long Term Goals - 03/24/20 1357      PT LONG TERM GOAL #1   Title Patient will be able to pick up light box off the floor with good mechanics without pain to improve ability to lift items as needed    Time 6    Period Weeks    Status New    Target Date 05/05/20      PT LONG TERM GOAL #2   Title Patient will be able to ascend and descend  flight of stairs with step through gait pattern with railing as needed to improve ability to get to second floor in home    Time 6    Period Weeks    Status New    Target Date 05/05/20      PT LONG TERM GOAL #3   Title Patient will improve on FOTO score to meet predicted outcomes to demonstrate improved functional mobility.    Time 6    Period Weeks    Status New    Target Date 05/05/20                 Plan - 03/26/20 1309    Clinical Impression Statement Patient shown and able to perform self STM with roller stick at beginning of session. He tolerates heel slides for improving hip ROM and requires cueing for decreasing intensity of stretch when painful. He has difficulty with marching and is given cueing for breathing while completing. Patients ease of completing most exercises today improves with repetition. He demonstrates good glute activation with clams and bridges and ROM improves throughout completing. Patient will continue to benefit from skilled physical therapy in order to reduce impairment and improve function.    Personal Factors and Comorbidities Comorbidity 1    Comorbidities HTN    Examination-Activity Limitations Bend;Carry;Stand;Transfers;Lift;Locomotion Level;Reach Overhead;Stairs;Squat;Sleep;Bed Mobility;Bathing;Sit    Examination-Participation Restrictions Cleaning;Driving;Meal Prep;Occupation;Yard Work;Shop    Stability/Clinical Decision Making Stable/Uncomplicated    Rehab Potential Excellent    PT Frequency 3x / week    PT Duration 6 weeks    PT Treatment/Interventions ADLs/Self Care Home Management;Aquatic Therapy;Cryotherapy;Electrical Stimulation;Moist Heat;Traction;Balance training;Therapeutic exercise;Therapeutic activities;Functional mobility training;Stair training;Gait training;DME Instruction;Neuromuscular re-education;Patient/family education;Manual techniques;Dry needling;Passive range of motion;Scar mobilization;Joint Manipulations    PT Next Visit  Plan hip PROM--> AAROM, isometrics transitional mobility and walking    PT Home Exercise Plan texted to pt from medbridge- hip add/abd isometrics, LAQs, quad sets, ankle pumps 10/29 bridge, clam, heel slides, hip abd standing, marches supine  Consulted and Agree with Plan of Care Patient           Patient will benefit from skilled therapeutic intervention in order to improve the following deficits and impairments:  Pain, Decreased activity tolerance, Decreased mobility, Decreased skin integrity, Abnormal gait, Increased edema, Decreased strength, Difficulty walking, Decreased balance, Decreased range of motion  Visit Diagnosis: Pain in right hip  Stiffness of right hip, not elsewhere classified  Difficulty in walking, not elsewhere classified  Other abnormalities of gait and mobility     Problem List Patient Active Problem List   Diagnosis Date Noted   S/P rotator cuff repair 02/10/2014   Pain in joint, shoulder region 02/10/2014   Muscle weakness (generalized) 02/10/2014   Decreased range of motion of right shoulder 02/10/2014    1:56 PM, 03/26/20 Mearl Latin PT, DPT Physical Therapist at [facility] [place], [place], [address] Phone: [phone]   Fax:  [phone]  Name: MUNEEB VERAS MRN: [id number] Date of Birth: 1966-04-24

## 2020-03-26 NOTE — Patient Instructions (Signed)
Access Code: [email or web address] URL: [email or web address] Date: 03/26/2020 Prepared by: Mitzi Hansen Sarim Rothman  Exercises Supine Bridge - 1 x daily - 7 x weekly - 2 sets - 10 reps Supine March - 1 x daily - 7 x weekly - 2 sets - 5 reps Supine Heel Slide with Strap - 1 x daily - 7 x weekly - 10 reps - 10 second hold Heel rises with counter support - 1 x daily - 7 x weekly - 2 sets - 15 reps Clamshell - 1 x daily - 7 x weekly - 3 sets - 5 reps Standing Hip Abduction with Counter Support - 1 x daily - 7 x weekly - 2 sets - 10 reps

## 2020-03-29 ENCOUNTER — Ambulatory Visit (HOSPITAL_COMMUNITY): Payer: BC Managed Care – PPO | Attending: Orthopedic Surgery | Admitting: Physical Therapy

## 2020-03-29 ENCOUNTER — Encounter (HOSPITAL_COMMUNITY): Payer: Self-pay | Admitting: Physical Therapy

## 2020-03-29 ENCOUNTER — Other Ambulatory Visit: Payer: Self-pay

## 2020-03-29 DIAGNOSIS — M25551 Pain in right hip: Secondary | ICD-10-CM | POA: Diagnosis not present

## 2020-03-29 DIAGNOSIS — M25651 Stiffness of right hip, not elsewhere classified: Secondary | ICD-10-CM | POA: Diagnosis not present

## 2020-03-29 DIAGNOSIS — R262 Difficulty in walking, not elsewhere classified: Secondary | ICD-10-CM | POA: Diagnosis not present

## 2020-03-29 DIAGNOSIS — R2689 Other abnormalities of gait and mobility: Secondary | ICD-10-CM

## 2020-03-29 NOTE — Therapy (Signed)
[place], [place], [address] Phone: [phone]   Fax:  [phone]  Physical Therapy Treatment  Patient Details  Name: Marcus Chen MRN: [id number] Date of Birth: Oct 08, 1965 Referring Provider (PT): Edmonia Lynch   Encounter Date: 03/29/2020   PT End of Session - 03/29/20 1324    Visit Number 3    Number of Visits 18    Date for PT Re-Evaluation 05/05/20    Authorization Type BCBS VL 30, no auth, secondary, UHC VL 60 no auth    Authorization - Visit Number 3    Authorization - Number of Visits 30    PT Start Time 7425   arrives late   PT Stop Time 1359    PT Time Calculation (min) 34 min    Activity Tolerance Patient tolerated treatment well    Behavior During Therapy [facility] for tasks assessed/performed           Past Medical History:  Diagnosis Date  . AC (acromioclavicular) joint bone spurs    spurs  . Allergy   . Arthritis   . Asthma    season,pet alergies  . Gout   . History of kidney stones   . Kidney stone   . Post-operative nausea and vomiting    nausea and vomitting  . Shoulder pain, bilateral     Past Surgical History:  Procedure Laterality Date  . APPENDECTOMY    . ROTATOR CUFF REPAIR  2014   right/ left shoulder was done 2010  . stomach reduction  2003  . TONSILLECTOMY    . TOTAL HIP ARTHROPLASTY Right 03/23/2020   Procedure: TOTAL HIP ARTHROPLASTY ANTERIOR APPROACH;  Surgeon: Renette Butters, MD;  Location: [facility];  Service: Orthopedics;  Laterality: Right;    There were no vitals filed for this visit.   Subjective Assessment - 03/29/20 1326    Subjective Patient states some soreness because he may have overdid it with his exercises. Exercises are going well. Some deep soreness.    Currently in Pain? Yes    Pain Score 1     Pain Location Hip    Pain Orientation Right                             [facility] Adult PT Treatment/Exercise - 03/29/20 0001       Knee/Hip Exercises: Standing   Hip Flexion Both;2 sets;10 reps    Hip Abduction AROM;Both;10 reps;2 sets    Abduction Limitations cueing for glute activation    Lateral Step Up Right;2 sets;10 reps;Hand Hold: 1;Step Height: 4"    Forward Step Up Right;2 sets;10 reps;Hand Hold: 1;Step Height: 4"    Functional Squat 2 sets;10 reps      Knee/Hip Exercises: Supine   Bridges Both;2 sets;10 reps    Other Supine Knee/Hip Exercises marching 2x10  bilateral                   PT Education - 03/29/20 1324    Education Details HEP, exercise mechanics    Person(s) Educated Patient    Methods Explanation;Demonstration    Comprehension Verbalized understanding;Returned demonstration            PT Short Term Goals - 03/24/20 1356      PT SHORT TERM GOAL #1   Title Patient will be able to walk at least 226 feet in 2 minutes with no assistive device to demonstrate  improved functional mobility    Time 3    Period Weeks    Status New    Target Date 04/14/20      PT SHORT TERM GOAL #2   Title Patient will report at least 50% improvement in overall symptoms and/or function to demonstrate improved functional mobility    Time 3    Period Weeks    Status New    Target Date 04/14/20      PT SHORT TERM GOAL #3   Title Patient will be independent in self management strategies to improve quality of life and functional outcomes.    Time 3    Period Weeks    Status New    Target Date 04/14/20             PT Long Term Goals - 03/24/20 1357      PT LONG TERM GOAL #1   Title Patient will be able to pick up light box off the floor with good mechanics without pain to improve ability to lift items as needed    Time 6    Period Weeks    Status New    Target Date 05/05/20      PT LONG TERM GOAL #2   Title Patient will be able to ascend and descend flight of stairs with step through gait pattern with railing as needed to improve ability to get to second floor in home    Time 6     Period Weeks    Status New    Target Date 05/05/20      PT LONG TERM GOAL #3   Title Patient will improve on FOTO score to meet predicted outcomes to demonstrate improved functional mobility.    Time 6    Period Weeks    Status New    Target Date 05/05/20                 Plan - 03/29/20 1325    Clinical Impression Statement Patient able to complete increased reps of marching today with improving mechanics and strength. Patient given cueing for glute activation of stance leg with standing hip exercises. He is able to complete marches without UE support but has c/o hip pain with increased hip flexion. He requires unilateral UE support with stair exercises secondary to impaired strength. Patient educated on using small step at home for strengthening vs. large step with compensation. Patient given cueing for decreasing weight shift off RLE with squatting with fair carry over. Compensation may be due to leg length discrepancy.  Patient will continue to benefit from skilled physical therapy to reduce impairment and improve function.    Personal Factors and Comorbidities Comorbidity 1    Comorbidities HTN    Examination-Activity Limitations Bend;Carry;Stand;Transfers;Lift;Locomotion Level;Reach Overhead;Stairs;Squat;Sleep;Bed Mobility;Bathing;Sit    Examination-Participation Restrictions Cleaning;Driving;Meal Prep;Occupation;Yard Work;Shop    Stability/Clinical Decision Making Stable/Uncomplicated    Rehab Potential Excellent    PT Frequency 3x / week    PT Duration 6 weeks    PT Treatment/Interventions ADLs/Self Care Home Management;Aquatic Therapy;Cryotherapy;Electrical Stimulation;Moist Heat;Traction;Balance training;Therapeutic exercise;Therapeutic activities;Functional mobility training;Stair training;Gait training;DME Instruction;Neuromuscular re-education;Patient/family education;Manual techniques;Dry needling;Passive range of motion;Scar mobilization;Joint Manipulations    PT Next  Visit Plan hip PROM--> AAROM, isometrics transitional mobility and walking    PT Home Exercise Plan texted to pt from medbridge- hip add/abd isometrics, LAQs, quad sets, ankle pumps 10/29 bridge, clam, heel slides, hip abd standing, marches supine 11/1 step up forward and lateral, squats    Consulted and  Agree with Plan of Care Patient           Patient will benefit from skilled therapeutic intervention in order to improve the following deficits and impairments:  Pain, Decreased activity tolerance, Decreased mobility, Decreased skin integrity, Abnormal gait, Increased edema, Decreased strength, Difficulty walking, Decreased balance, Decreased range of motion  Visit Diagnosis: Pain in right hip  Stiffness of right hip, not elsewhere classified  Difficulty in walking, not elsewhere classified  Other abnormalities of gait and mobility     Problem List Patient Active Problem List   Diagnosis Date Noted  . S/P rotator cuff repair 02/10/2014  . Pain in joint, shoulder region 02/10/2014  . Muscle weakness (generalized) 02/10/2014  . Decreased range of motion of right shoulder 02/10/2014   2:01 PM, 03/29/20 Mearl Latin PT, DPT Physical Therapist at [facility] [place], [place], [address] Phone: [phone]   Fax:  [phone]  Name: TEEJAY MEADER MRN: [id number] Date of Birth: [birth year]

## 2020-03-31 ENCOUNTER — Other Ambulatory Visit: Payer: Self-pay

## 2020-03-31 ENCOUNTER — Encounter (HOSPITAL_COMMUNITY): Payer: Self-pay | Admitting: Physical Therapy

## 2020-03-31 ENCOUNTER — Ambulatory Visit (HOSPITAL_COMMUNITY): Payer: BC Managed Care – PPO | Admitting: Physical Therapy

## 2020-03-31 DIAGNOSIS — R262 Difficulty in walking, not elsewhere classified: Secondary | ICD-10-CM | POA: Diagnosis not present

## 2020-03-31 DIAGNOSIS — M25551 Pain in right hip: Secondary | ICD-10-CM | POA: Diagnosis not present

## 2020-03-31 DIAGNOSIS — R2689 Other abnormalities of gait and mobility: Secondary | ICD-10-CM

## 2020-03-31 DIAGNOSIS — M25651 Stiffness of right hip, not elsewhere classified: Secondary | ICD-10-CM

## 2020-03-31 NOTE — Therapy (Signed)
[place], [place], [address] Phone: [phone]   Fax:  [phone]  Physical Therapy Treatment  Patient Details  Name: Marcus Chen MRN: [id number] Date of Birth: 10-16-1965 Referring Provider (PT): Edmonia Lynch   Encounter Date: 03/31/2020   PT End of Session - 03/31/20 1411    Visit Number 4    Number of Visits 18    Date for PT Re-Evaluation 05/05/20    Authorization Type BCBS VL 30, no auth, secondary, UHC VL 60 no auth    Authorization - Visit Number 4    Authorization - Number of Visits 30    PT Start Time 6440   arrives late   PT Stop Time 1446    PT Time Calculation (min) 36 min    Activity Tolerance Patient tolerated treatment well    Behavior During Therapy [facility] for tasks assessed/performed           Past Medical History:  Diagnosis Date  . AC (acromioclavicular) joint bone spurs    spurs  . Allergy   . Arthritis   . Asthma    season,pet alergies  . Gout   . History of kidney stones   . Kidney stone   . Post-operative nausea and vomiting    nausea and vomitting  . Shoulder pain, bilateral     Past Surgical History:  Procedure Laterality Date  . APPENDECTOMY    . ROTATOR CUFF REPAIR  2014   right/ left shoulder was done 2010  . stomach reduction  2003  . TONSILLECTOMY    . TOTAL HIP ARTHROPLASTY Right 03/23/2020   Procedure: TOTAL HIP ARTHROPLASTY ANTERIOR APPROACH;  Surgeon: Renette Butters, MD;  Location: [facility];  Service: Orthopedics;  Laterality: Right;    There were no vitals filed for this visit.   Subjective Assessment - 03/31/20 1412    Subjective Patient states nothing new to report. He has deep soreness today.    Currently in Pain? Yes    Pain Location Hip    Pain Orientation Right    Pain Descriptors / Indicators Sore    Pain Type Surgical pain                             [facility] Adult PT Treatment/Exercise - 03/31/20 0001      Knee/Hip  Exercises: Standing   Lateral Step Up Right;2 sets;10 reps;Hand Hold: 1;Step Height: 6"    Forward Step Up Right;2 sets;10 reps;Hand Hold: 1;Step Height: 6"    Functional Squat 2 sets;10 reps    Functional Squat Limitations      SLS with Vectors 4x 5 sec holds each position    Other Standing Knee Exercises tandem stance 2x 30 second bilateral    Other Standing Knee Exercises lateral stepping 6x 15      Knee/Hip Exercises: Seated   Sit to Sand 10 reps;2 sets                  PT Education - 03/31/20 1410    Education Details HEP, exercise mechanics    Person(s) Educated Patient    Methods Explanation;Demonstration    Comprehension Verbalized understanding;Returned demonstration            PT Short Term Goals - 03/24/20 1356      PT SHORT TERM GOAL #1   Title Patient will be able to walk at least 226 feet in  2 minutes with no assistive device to demonstrate improved functional mobility    Time 3    Period Weeks    Status New    Target Date 04/14/20      PT SHORT TERM GOAL #2   Title Patient will report at least 50% improvement in overall symptoms and/or function to demonstrate improved functional mobility    Time 3    Period Weeks    Status New    Target Date 04/14/20      PT SHORT TERM GOAL #3   Title Patient will be independent in self management strategies to improve quality of life and functional outcomes.    Time 3    Period Weeks    Status New    Target Date 04/14/20             PT Long Term Goals - 03/24/20 1357      PT LONG TERM GOAL #1   Title Patient will be able to pick up light box off the floor with good mechanics without pain to improve ability to lift items as needed    Time 6    Period Weeks    Status New    Target Date 05/05/20      PT LONG TERM GOAL #2   Title Patient will be able to ascend and descend flight of stairs with step through gait pattern with railing as needed to improve ability to get to second floor in home    Time 6     Period Weeks    Status New    Target Date 05/05/20      PT LONG TERM GOAL #3   Title Patient will improve on FOTO score to meet predicted outcomes to demonstrate improved functional mobility.    Time 6    Period Weeks    Status New    Target Date 05/05/20                 Plan - 03/31/20 1411    Clinical Impression Statement Patient demonstrates good squatting mechanics with UE support and slight weight shift off RLE. He is educated on shoe riser for LLE due to leg length discrepancy. Patient completes sit to stand with black foam under LLE for RLE bias. He requires cueing for unilateral UE support to improve mechanics and focus on LE strength. He completes step up at 6 inch step. He requires cueing for full extension of knees with stair exercises. Patient has slight c/o pain and stretching with SLS with vectors but completes with good/fair mechanics following cueing. Patient educated on f/u with MD regarding driving. Patient will continue to benefit from skilled physical therapy in order to reduce impairment and improve function.    Personal Factors and Comorbidities Comorbidity 1    Comorbidities HTN    Examination-Activity Limitations Bend;Carry;Stand;Transfers;Lift;Locomotion Level;Reach Overhead;Stairs;Squat;Sleep;Bed Mobility;Bathing;Sit    Examination-Participation Restrictions Cleaning;Driving;Meal Prep;Occupation;Yard Work;Shop    Stability/Clinical Decision Making Stable/Uncomplicated    Rehab Potential Excellent    PT Frequency 3x / week    PT Duration 6 weeks    PT Treatment/Interventions ADLs/Self Care Home Management;Aquatic Therapy;Cryotherapy;Electrical Stimulation;Moist Heat;Traction;Balance training;Therapeutic exercise;Therapeutic activities;Functional mobility training;Stair training;Gait training;DME Instruction;Neuromuscular re-education;Patient/family education;Manual techniques;Dry needling;Passive range of motion;Scar mobilization;Joint Manipulations    PT  Next Visit Plan hip PROM--> AAROM, isometrics transitional mobility and walking    PT Home Exercise Plan texted to pt from [employer]- hip add/abd isometrics, LAQs, quad sets, ankle pumps 10/29 bridge, clam, heel slides, hip abd standing,  marches supine 11/1 step up forward and lateral, squats    Consulted and Agree with Plan of Care Patient           Patient will benefit from skilled therapeutic intervention in order to improve the following deficits and impairments:  Pain, Decreased activity tolerance, Decreased mobility, Decreased skin integrity, Abnormal gait, Increased edema, Decreased strength, Difficulty walking, Decreased balance, Decreased range of motion  Visit Diagnosis: Pain in right hip  Stiffness of right hip, not elsewhere classified  Difficulty in walking, not elsewhere classified  Other abnormalities of gait and mobility     Problem List Patient Active Problem List   Diagnosis Date Noted  . S/P rotator cuff repair 02/10/2014  . Pain in joint, shoulder region 02/10/2014  . Muscle weakness (generalized) 02/10/2014  . Decreased range of motion of right shoulder 02/10/2014     2:50 PM, 03/31/20 Mearl Latin PT, DPT Physical Therapist at [place], [place], [address] Phone: [phone]   Fax:  [phone]  Name: Marcus Chen MRN: [id number] Date of Birth: 08-27-1965

## 2020-04-02 ENCOUNTER — Ambulatory Visit (HOSPITAL_COMMUNITY): Payer: BC Managed Care – PPO | Admitting: Physical Therapy

## 2020-04-02 ENCOUNTER — Other Ambulatory Visit: Payer: Self-pay

## 2020-04-02 ENCOUNTER — Encounter (HOSPITAL_COMMUNITY): Payer: Self-pay | Admitting: Physical Therapy

## 2020-04-02 DIAGNOSIS — R262 Difficulty in walking, not elsewhere classified: Secondary | ICD-10-CM

## 2020-04-02 DIAGNOSIS — R2689 Other abnormalities of gait and mobility: Secondary | ICD-10-CM | POA: Diagnosis not present

## 2020-04-02 DIAGNOSIS — M25551 Pain in right hip: Secondary | ICD-10-CM | POA: Diagnosis not present

## 2020-04-02 DIAGNOSIS — M25651 Stiffness of right hip, not elsewhere classified: Secondary | ICD-10-CM

## 2020-04-02 NOTE — Therapy (Addendum)
[place], [place], [address] Phone: [phone]   Fax:  [phone]  Physical Therapy Treatment  Patient Details  Name: Marcus Chen MRN: [id number] Date of Birth: Sep 09, 1965 Referring Provider (PT): Edmonia Lynch   Encounter Date: 04/02/2020   PT End of Session - 04/02/20 1439    Visit Number 5    Number of Visits 18    Date for PT Re-Evaluation 05/05/20    Authorization Type BCBS VL 30, no auth, secondary, UHC VL 60 no auth    Authorization - Visit Number 4    Authorization - Number of Visits 30    PT Start Time 3151   pt late to session   PT Stop Time 1440    PT Time Calculation (min) 30 min    Activity Tolerance Patient tolerated treatment well    Behavior During Therapy [facility] for tasks assessed/performed           Past Medical History:  Diagnosis Date  . AC (acromioclavicular) joint bone spurs    spurs  . Allergy   . Arthritis   . Asthma    season,pet alergies  . Gout   . History of kidney stones   . Kidney stone   . Post-operative nausea and vomiting    nausea and vomitting  . Shoulder pain, bilateral     Past Surgical History:  Procedure Laterality Date  . APPENDECTOMY    . ROTATOR CUFF REPAIR  2014   right/ left shoulder was done 2010  . stomach reduction  2003  . TONSILLECTOMY    . TOTAL HIP ARTHROPLASTY Right 03/23/2020   Procedure: TOTAL HIP ARTHROPLASTY ANTERIOR APPROACH;  Surgeon: Renette Butters, MD;  Location: [facility];  Service: Orthopedics;  Laterality: Right;    There were no vitals filed for this visit.   Subjective Assessment - 04/02/20 1444    Subjective States stiffness in the front and soreness in the back. States that he is really sore after last session and today.  Debated comingin today because of soreness and stiffness.              [facility] PT Assessment - 04/02/20 0001      Assessment   Medical Diagnosis R THA anterior approach    Referring Provider (PT)  Edmonia Lynch                         [facility] Adult PT Treatment/Exercise - 04/02/20 0001      Knee/Hip Exercises: Prone   Other Prone Exercises knee flexion R x15 10" holds    Other Prone Exercises quadruped rokcing in circles x15 clockwise and counter clockwise       Manual Therapy   Manual Therapy Soft tissue mobilization    Manual therapy comments completed separate rest of treatment    Soft tissue mobilization with percussion gun to right quad, ITB, hamstrings, adductors , glutes                    PT Short Term Goals - 03/24/20 1356      PT SHORT TERM GOAL #1   Title Patient will be able to walk at least 226 feet in 2 minutes with no assistive device to demonstrate improved functional mobility    Time 3    Period Weeks    Status New    Target Date 04/14/20      PT SHORT TERM  GOAL #2   Title Patient will report at least 50% improvement in overall symptoms and/or function to demonstrate improved functional mobility    Time 3    Period Weeks    Status New    Target Date 04/14/20      PT SHORT TERM GOAL #3   Title Patient will be independent in self management strategies to improve quality of life and functional outcomes.    Time 3    Period Weeks    Status New    Target Date 04/14/20             PT Long Term Goals - 03/24/20 1357      PT LONG TERM GOAL #1   Title Patient will be able to pick up light box off the floor with good mechanics without pain to improve ability to lift items as needed    Time 6    Period Weeks    Status New    Target Date 05/05/20      PT LONG TERM GOAL #2   Title Patient will be able to ascend and descend flight of stairs with step through gait pattern with railing as needed to improve ability to get to second floor in home    Time 6    Period Weeks    Status New    Target Date 05/05/20      PT LONG TERM GOAL #3   Title Patient will improve on FOTO score to meet predicted outcomes to demonstrate improved  functional mobility.    Time 6    Period Weeks    Status New    Target Date 05/05/20                 Plan - 04/02/20 1445    Clinical Impression Statement Continued limp noted during session. Patient eager to do exercises despite increased pain and soreness. Focused on mobility and reducing muscle tension secondary to patient report of stiffness and pain. Improved mobility noted after manual with percussion gun. Patient continues to push self and at end of session patient was self using percussion gun on anterior hip region despite PT saying to not go over bandage area at this time. Patient eager to get back to higher level of activities, will continue with progression next session.    Personal Factors and Comorbidities Comorbidity 1    Comorbidities HTN    Examination-Activity Limitations Bend;Carry;Stand;Transfers;Lift;Locomotion Level;Reach Overhead;Stairs;Squat;Sleep;Bed Mobility;Bathing;Sit    Examination-Participation Restrictions Cleaning;Driving;Meal Prep;Occupation;Yard Work;Shop    Stability/Clinical Decision Making Stable/Uncomplicated    Rehab Potential Excellent    PT Frequency 3x / week    PT Duration 6 weeks    PT Treatment/Interventions ADLs/Self Care Home Management;Aquatic Therapy;Cryotherapy;Electrical Stimulation;Moist Heat;Traction;Balance training;Therapeutic exercise;Therapeutic activities;Functional mobility training;Stair training;Gait training;DME Instruction;Neuromuscular re-education;Patient/family education;Manual techniques;Dry needling;Passive range of motion;Scar mobilization;Joint Manipulations    PT Next Visit Plan hip PROM--> AAROM, isometrics transitional mobility and walking    PT Home Exercise Plan texted to pt from [facility]- hip add/abd isometrics, LAQs, quad sets, ankle pumps 10/29 bridge, clam, heel slides, hip abd standing, marches supine 11/1 step up forward and lateral, squats    Consulted and Agree with Plan of Care Patient            Patient will benefit from skilled therapeutic intervention in order to improve the following deficits and impairments:  Pain, Decreased activity tolerance, Decreased mobility, Decreased skin integrity, Abnormal gait, Increased edema, Decreased strength, Difficulty walking, Decreased balance, Decreased range of motion  Visit Diagnosis: Pain in right hip  Stiffness of right hip, not elsewhere classified  Difficulty in walking, not elsewhere classified  Other abnormalities of gait and mobility     Problem List Patient Active Problem List   Diagnosis Date Noted  . S/P rotator cuff repair 02/10/2014  . Pain in joint, shoulder region 02/10/2014  . Muscle weakness (generalized) 02/10/2014  . Decreased range of motion of right shoulder 02/10/2014   2:46 PM, 04/02/20 Jerene Pitch, DPT Physical Therapy with [facility]  [phone] office  [place] [address] [place], [place], [address] Phone: [phone]   Fax:  [phone]  Name: KALIEF KATTNER MRN: [id number] Date of Birth: 11/01/1965

## 2020-04-05 ENCOUNTER — Ambulatory Visit (HOSPITAL_COMMUNITY): Payer: BC Managed Care – PPO | Admitting: Physical Therapy

## 2020-04-05 ENCOUNTER — Encounter (HOSPITAL_COMMUNITY): Payer: Self-pay | Admitting: Physical Therapy

## 2020-04-05 ENCOUNTER — Other Ambulatory Visit: Payer: Self-pay

## 2020-04-05 DIAGNOSIS — R2689 Other abnormalities of gait and mobility: Secondary | ICD-10-CM | POA: Diagnosis not present

## 2020-04-05 DIAGNOSIS — R262 Difficulty in walking, not elsewhere classified: Secondary | ICD-10-CM | POA: Diagnosis not present

## 2020-04-05 DIAGNOSIS — M25651 Stiffness of right hip, not elsewhere classified: Secondary | ICD-10-CM | POA: Diagnosis not present

## 2020-04-05 DIAGNOSIS — M25551 Pain in right hip: Secondary | ICD-10-CM | POA: Diagnosis not present

## 2020-04-05 NOTE — Therapy (Signed)
[place], [place], [address] Phone: [phone]   Fax:  [phone]  Physical Therapy Treatment  Patient Details  Name: Marcus Chen MRN: [id number] Date of Birth: [birth year] Referring Provider (PT): Edmonia Lynch   Encounter Date: 04/05/2020   PT End of Session - 04/05/20 1322    Visit Number 6    Number of Visits 18    Date for PT Re-Evaluation 05/05/20    Authorization Type BCBS VL 30, no auth, secondary, UHC VL 60 no auth    Authorization - Visit Number 5    Authorization - Number of Visits 30    PT Start Time 3893   pt late to session   PT Stop Time 1355    PT Time Calculation (min) 32 min    Activity Tolerance Patient tolerated treatment well    Behavior During Therapy [facility] for tasks assessed/performed           Past Medical History:  Diagnosis Date  . AC (acromioclavicular) joint bone spurs    spurs  . Allergy   . Arthritis   . Asthma    season,pet alergies  . Gout   . History of kidney stones   . Kidney stone   . Post-operative nausea and vomiting    nausea and vomitting  . Shoulder pain, bilateral     Past Surgical History:  Procedure Laterality Date  . APPENDECTOMY    . ROTATOR CUFF REPAIR  2014   right/ left shoulder was done 2010  . stomach reduction  2003  . TONSILLECTOMY    . TOTAL HIP ARTHROPLASTY Right 03/23/2020   Procedure: TOTAL HIP ARTHROPLASTY ANTERIOR APPROACH;  Surgeon: Renette Butters, MD;  Location: [facility];  Service: Orthopedics;  Laterality: Right;    There were no vitals filed for this visit.   Subjective Assessment - 04/05/20 1328    Subjective Patient reports tightness and soreness in hip. States he was really sore after last session, but a good soreness. Thinks that muscles and feeling are starting to wake up. Bought a percussion gun and used it yesterday. States that over the weekend he has ran out of his prescription    Currently in Pain? Yes    Pain  Score 2     Pain Location Hip    Pain Orientation Right    Pain Descriptors / Indicators Sore    Pain Type Surgical pain              [facility] PT Assessment - 04/05/20 0001      Assessment   Medical Diagnosis R THA anterior approach    Referring Provider (PT) Edmonia Lynch    Onset Date/Surgical Date 03/23/20      Special Tests    Special Tests Leg LengthTest    Leg length test  True      True   Length 89.2 bilaterally    Comments no difference noted in legs with true leg length                         [facility] Adult PT Treatment/Exercise - 04/05/20 0001      Knee/Hip Exercises: Stretches   Other Knee/Hip Stretches knee flexion lunge on step for stretch x15 5" holds R       Knee/Hip Exercises: Standing   Lateral Step Up Right;2 sets;10 reps;Hand Hold: 1;Step Height: 6"    Forward Step Up Right;10 reps;Hand Hold:  1;Step Height: 6";3 sets    Other Standing Knee Exercises lateral stepping with Red theraband anchored 2x5 B 5" holds                  PT Education - 04/05/20 1538    Education Details on leg length, on importance of not over doing things. On resting as needed, walking program, possible benefits of termporary heel lift secondary to current functionla leg length discrepancy.    Person(s) Educated Patient    Methods Explanation    Comprehension Verbalized understanding            PT Short Term Goals - 03/24/20 1356      PT SHORT TERM GOAL #1   Title Patient will be able to walk at least 226 feet in 2 minutes with no assistive device to demonstrate improved functional mobility    Time 3    Period Weeks    Status New    Target Date 04/14/20      PT SHORT TERM GOAL #2   Title Patient will report at least 50% improvement in overall symptoms and/or function to demonstrate improved functional mobility    Time 3    Period Weeks    Status New    Target Date 04/14/20      PT SHORT TERM GOAL #3   Title Patient will be independent in self  management strategies to improve quality of life and functional outcomes.    Time 3    Period Weeks    Status New    Target Date 04/14/20             PT Long Term Goals - 03/24/20 1357      PT LONG TERM GOAL #1   Title Patient will be able to pick up light box off the floor with good mechanics without pain to improve ability to lift items as needed    Time 6    Period Weeks    Status New    Target Date 05/05/20      PT LONG TERM GOAL #2   Title Patient will be able to ascend and descend flight of stairs with step through gait pattern with railing as needed to improve ability to get to second floor in home    Time 6    Period Weeks    Status New    Target Date 05/05/20      PT LONG TERM GOAL #3   Title Patient will improve on FOTO score to meet predicted outcomes to demonstrate improved functional mobility.    Time 6    Period Weeks    Status New    Target Date 05/05/20                 Plan - 04/05/20 1351    Clinical Impression Statement Continued limp noted with ambulation. Patient with reports of continued soreness and tightness but overall states it is not that bad. Reports tightness is worse with sitting for long periods of time. Discussed in length with patient not over doing things as he has a tendency to do too much and is still very much in the inflammatory stage. Discussed focusing on walking program within tolerance. Patient to see MD next session, will continue with ROM, hip strengthening and pain management strategies.    Personal Factors and Comorbidities Comorbidity 1    Comorbidities HTN    Examination-Activity Limitations Bend;Carry;Stand;Transfers;Lift;Locomotion Level;Reach Overhead;Stairs;Squat;Sleep;Bed Mobility;Bathing;Sit    Examination-Participation Restrictions Cleaning;Driving;Meal Prep;Occupation;Yard Work;Shop  Stability/Clinical Decision Making Stable/Uncomplicated    Rehab Potential Excellent    PT Frequency 3x / week    PT Duration 6  weeks    PT Treatment/Interventions ADLs/Self Care Home Management;Aquatic Therapy;Cryotherapy;Electrical Stimulation;Moist Heat;Traction;Balance training;Therapeutic exercise;Therapeutic activities;Functional mobility training;Stair training;Gait training;DME Instruction;Neuromuscular re-education;Patient/family education;Manual techniques;Dry needling;Passive range of motion;Scar mobilization;Joint Manipulations    PT Next Visit Plan AROM, isometrics transitional mobility and walking    PT Home Exercise Plan texted to pt from [facility]- hip add/abd isometrics, LAQs, quad sets, ankle pumps 10/29 bridge, clam, heel slides, hip abd standing, marches supine 11/1 step up forward and lateral, squats    Consulted and Agree with Plan of Care Patient           Patient will benefit from skilled therapeutic intervention in order to improve the following deficits and impairments:  Pain, Decreased activity tolerance, Decreased mobility, Decreased skin integrity, Abnormal gait, Increased edema, Decreased strength, Difficulty walking, Decreased balance, Decreased range of motion  Visit Diagnosis: Pain in right hip  Stiffness of right hip, not elsewhere classified  Difficulty in walking, not elsewhere classified  Other abnormalities of gait and mobility     Problem List Patient Active Problem List   Diagnosis Date Noted  . S/P rotator cuff repair 02/10/2014  . Pain in joint, shoulder region 02/10/2014  . Muscle weakness (generalized) 02/10/2014  . Decreased range of motion of right shoulder 02/10/2014    3:52 PM, 04/05/20 Jerene Pitch, DPT Physical Therapy with [facility]  [phone] office  [place] [address] [place], [place], [address] Phone: [phone]   Fax:  [phone]  Name: Marcus Chen MRN: [id number] Date of Birth: 1965/11/20

## 2020-04-07 ENCOUNTER — Ambulatory Visit (HOSPITAL_COMMUNITY): Payer: BC Managed Care – PPO

## 2020-04-07 ENCOUNTER — Other Ambulatory Visit: Payer: Self-pay

## 2020-04-07 ENCOUNTER — Encounter (HOSPITAL_COMMUNITY): Payer: Self-pay

## 2020-04-07 DIAGNOSIS — R262 Difficulty in walking, not elsewhere classified: Secondary | ICD-10-CM

## 2020-04-07 DIAGNOSIS — M25551 Pain in right hip: Secondary | ICD-10-CM

## 2020-04-07 DIAGNOSIS — M25651 Stiffness of right hip, not elsewhere classified: Secondary | ICD-10-CM | POA: Diagnosis not present

## 2020-04-07 DIAGNOSIS — R2689 Other abnormalities of gait and mobility: Secondary | ICD-10-CM | POA: Diagnosis not present

## 2020-04-07 DIAGNOSIS — M1611 Unilateral primary osteoarthritis, right hip: Secondary | ICD-10-CM | POA: Diagnosis not present

## 2020-04-07 NOTE — Therapy (Signed)
[place], [place], [address] Phone: [phone]   Fax:  [phone]  Physical Therapy Treatment  Patient Details  Name: Marcus Chen MRN: [id number] Date of Birth: [birth year] Referring Provider (PT): Edmonia Lynch   Encounter Date: 04/07/2020   PT End of Session - 04/07/20 1456    Visit Number 7    Number of Visits 18    Date for PT Re-Evaluation 05/05/20    Authorization Type BCBS VL 30, no auth, secondary, UHC VL 60 no auth    Authorization - Visit Number 6    Authorization - Number of Visits 30    PT Start Time 0092    PT Stop Time 1531    PT Time Calculation (min) 38 min    Activity Tolerance Patient tolerated treatment well    Behavior During Therapy [facility] for tasks assessed/performed           Past Medical History:  Diagnosis Date  . AC (acromioclavicular) joint bone spurs    spurs  . Allergy   . Arthritis   . Asthma    season,pet alergies  . Gout   . History of kidney stones   . Kidney stone   . Post-operative nausea and vomiting    nausea and vomitting  . Shoulder pain, bilateral     Past Surgical History:  Procedure Laterality Date  . APPENDECTOMY    . ROTATOR CUFF REPAIR  2014   right/ left shoulder was done 2010  . stomach reduction  2003  . TONSILLECTOMY    . TOTAL HIP ARTHROPLASTY Right 03/23/2020   Procedure: TOTAL HIP ARTHROPLASTY ANTERIOR APPROACH;  Surgeon: Renette Butters, MD;  Location: [facility];  Service: Orthopedics;  Laterality: Right;    There were no vitals filed for this visit.   Subjective Assessment - 04/07/20 1455    Subjective Pt stated he is feeling good today.  Some soreness and tightness in Rt groin area.    Currently in Pain? No/denies              [facility] PT Assessment - 04/07/20 0001      Assessment   Medical Diagnosis R THA anterior approach    Referring Provider (PT) Edmonia Lynch    Onset Date/Surgical Date 03/23/20    Next MD Visit  04/07/20      Observation/Other Assessments   Focus on Therapeutic Outcomes (FOTO)  55% functional abilities   was 4% function      Standardized Balance Assessment   Standardized Balance Assessment Five Times Sit to Stand (P)                          [facility] Adult PT Treatment/Exercise - 04/07/20 0001      Ambulation/Gait   Ambulation/Gait Yes    Ambulation/Gait Assistance 5: Supervision    Ambulation Distance (Feet) 474 Feet   was 90 ft.   Assistive device None    Gait Pattern Step-through pattern   anatlgic gait mechanics with increased time and speed   Ambulation Surface Level;Indoor    Gait velocity decreased    Gait Comments 2MW       Knee/Hip Exercises: Stretches   Other Knee/Hip Stretches butterfly stretch 3x 30"    Other Knee/Hip Stretches knee flexion lunge on step for stretch x15 5" holds R       Knee/Hip Exercises: Standing   Functional Squat 10 reps  Functional Squat Limitations 3D hip excursion     Stairs 3RT 7in step height 1 HR                  PT Education - 04/07/20 1523    Education Details Reviewed importance of not over doing things, healing stage and not irritating/inflamation.  Reviewed goals 2MWT, 5STS, stairs and FOTO    Person(s) Educated Patient    Methods Explanation;Verbal cues    Comprehension Verbalized understanding;Returned demonstration            PT Short Term Goals - 04/07/20 1458      PT SHORT TERM GOAL #1   Title Patient will be able to walk at least 226 feet in 2 minutes with no assistive device to demonstrate improved functional mobility    Baseline 04/07/20:  2WMT 42f no AD, noted antalgic gait mechanics upon fatigue and increased speed.  (was 945fwith use of RW)    Status Achieved      PT SHORT TERM GOAL #2   Title Patient will report at least 50% improvement in overall symptoms and/or function to demonstrate improved functional mobility    Baseline 04/07/20:  Reports he feels 60-70% in overall  symptoms    Status Achieved      PT SHORT TERM GOAL #3   Title Patient will be independent in self management strategies to improve quality of life and functional outcomes.    Baseline 04/07/20:  Reports compliance with walking program 15-20 minutes and HEP daily.    Status Achieved             PT Long Term Goals - 04/07/20 1500      PT LONG TERM GOAL #1   Title Patient will be able to pick up light box off the floor with good mechanics without pain to improve ability to lift items as needed    Baseline 11/10: ongoing, tendency to bend wiht back and cueing required for good mechanics    Status On-going      PT LONG TERM GOAL #2   Title Patient will be able to ascend and descend flight of stairs with step through gait pattern with railing as needed to improve ability to get to second floor in home    Baseline 04/07/20: Able to demonstrate reciprocal pattern 7in step height 1 HR A    Status Achieved      PT LONG TERM GOAL #3   Title Patient will improve on FOTO score to meet predicted outcomes to demonstrate improved functional mobility.    Baseline 04/07/20:                 Plan - 04/07/20 1644    Clinical Impression Statement Reviewed goals prior MD apt later today at 2 week post-op.  Pt progressing well towards all goals with all 3/3 STGs met and 2/3 LTG.  Increased cadence with 2MWT with no AD, noted antalgic gait mechanics upon musculature fatigue and increased speed.  Pt reports compliance with walking program and current HEP compliance.  Improved self perceived functional abilities with FOTO score jump to 55% (was 4% functional abilities) and stated he feels 60-70% improvements subjectively.  Main c/o this session was tightness in inner groin and lateral thigh, stretches and mobility exercises complete to address complaint.  Pt will continue to benefit from skilled intervention to improve functional strengthening to reduce injury with proper lifting, mobility and self care  strategies for pain control.  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    Personal Factors and Comorbidities Comorbidity 1    Comorbidities HTN    Examination-Activity Limitations Bend;Carry;Stand;Transfers;Lift;Locomotion Level;Reach Overhead;Stairs;Squat;Sleep;Bed Mobility;Bathing;Sit    Examination-Participation Restrictions Cleaning;Driving;Meal Prep;Occupation;Yard Work;Shop    Stability/Clinical Decision Making Stable/Uncomplicated    Clinical Decision Making Low    Rehab Potential Excellent    PT Frequency 3x / week    PT Duration 6 weeks    PT Treatment/Interventions ADLs/Self Care Home Management;Aquatic Therapy;Cryotherapy;Electrical Stimulation;Moist Heat;Traction;Balance training;Therapeutic exercise;Therapeutic activities;Functional mobility training;Stair training;Gait training;DME Instruction;Neuromuscular re-education;Patient/family education;Manual techniques;Dry needling;Passive range of motion;Scar mobilization;Joint Manipulations    PT Next Visit Plan AROM, isometrics transitional mobility and walking    PT Home Exercise Plan texted to pt from [facility]- hip add/abd isometrics, LAQs, quad sets, ankle pumps 10/29 bridge, clam, heel slides, hip abd standing, marches supine 11/1 step up forward and lateral, squats    Consulted and Agree with Plan of Care Patient           Patient will benefit from skilled therapeutic intervention in order to improve the following deficits and impairments:  Pain, Decreased activity tolerance, Decreased mobility, Decreased skin integrity, Abnormal gait, Increased edema, Decreased strength, Difficulty walking, Decreased balance, Decreased range of motion  Visit Diagnosis: Difficulty in walking, not elsewhere classified  Other abnormalities of gait and mobility  Stiffness of right hip, not elsewhere classified  Pain in right hip     Problem  List Patient Active Problem List   Diagnosis Date Noted  . S/P rotator cuff repair 02/10/2014  . Pain in joint, shoulder region 02/10/2014  . Muscle weakness (generalized) 02/10/2014  . Decreased range of motion of right shoulder 02/10/2014   Ihor Austin, LPTA/CLT; CBIS [phone]  Aldona Lento 04/07/2020, 4:59 PM  [place] [address] [place], [place], [address] Phone: [phone]   Fax:  [phone]  Name: Marcus Chen MRN: [id number] Date of Birth: 1965/06/30

## 2020-04-09 ENCOUNTER — Telehealth (HOSPITAL_COMMUNITY): Payer: Self-pay

## 2020-04-09 ENCOUNTER — Ambulatory Visit (HOSPITAL_COMMUNITY): Payer: BC Managed Care – PPO

## 2020-04-09 NOTE — Telephone Encounter (Signed)
No show, called and spoke to pt who stated he had a migraine today and unable to make it today.  Reminded next apt time and date with contact number given.    Ihor Austin, LPTA/CLT; Delana Meyer [phone]

## 2020-04-12 ENCOUNTER — Ambulatory Visit (HOSPITAL_COMMUNITY): Payer: BC Managed Care – PPO | Admitting: Physical Therapy

## 2020-04-12 ENCOUNTER — Other Ambulatory Visit: Payer: Self-pay

## 2020-04-12 ENCOUNTER — Encounter (HOSPITAL_COMMUNITY): Payer: Self-pay | Admitting: Physical Therapy

## 2020-04-12 DIAGNOSIS — R262 Difficulty in walking, not elsewhere classified: Secondary | ICD-10-CM | POA: Diagnosis not present

## 2020-04-12 DIAGNOSIS — M25651 Stiffness of right hip, not elsewhere classified: Secondary | ICD-10-CM

## 2020-04-12 DIAGNOSIS — R2689 Other abnormalities of gait and mobility: Secondary | ICD-10-CM

## 2020-04-12 DIAGNOSIS — M25551 Pain in right hip: Secondary | ICD-10-CM | POA: Diagnosis not present

## 2020-04-12 NOTE — Therapy (Signed)
[place], [place], [address] Phone: [phone]   Fax:  [phone]  Physical Therapy Treatment  Patient Details  Name: Marcus Chen MRN: [id number] Date of Birth: 1965/08/22 Referring Provider (PT): Edmonia Lynch   Encounter Date: 04/12/2020   PT End of Session - 04/12/20 1334    Visit Number 8    Number of Visits 18    Date for PT Re-Evaluation 05/05/20    Authorization Type BCBS VL 30, no auth, secondary, UHC VL 60 no auth    Authorization - Visit Number 7    Authorization - Number of Visits 30    PT Start Time 1330   pt late to session   PT Stop Time 1355    PT Time Calculation (min) 25 min    Activity Tolerance Patient tolerated treatment well    Behavior During Therapy [facility] for tasks assessed/performed           Past Medical History:  Diagnosis Date  . AC (acromioclavicular) joint bone spurs    spurs  . Allergy   . Arthritis   . Asthma    season,pet alergies  . Gout   . History of kidney stones   . Kidney stone   . Post-operative nausea and vomiting    nausea and vomitting  . Shoulder pain, bilateral     Past Surgical History:  Procedure Laterality Date  . APPENDECTOMY    . ROTATOR CUFF REPAIR  2014   right/ left shoulder was done 2010  . stomach reduction  2003  . TONSILLECTOMY    . TOTAL HIP ARTHROPLASTY Right 03/23/2020   Procedure: TOTAL HIP ARTHROPLASTY ANTERIOR APPROACH;  Surgeon: Renette Butters, MD;  Location: [facility];  Service: Orthopedics;  Laterality: Right;    There were no vitals filed for this visit.   Subjective Assessment - 04/12/20 1335    Subjective States that every step he takes he has symptoms down the side of his right leg and when his r8ight leg is dangling he has discomfort at the knee but the actual hip feels good. Saw MD who took stitches out released him no restrictions all as tolerated.    Currently in Pain? Yes    Pain Score 2     Pain Location Hip                [facility] PT Assessment - 04/12/20 0001      Assessment   Medical Diagnosis R THA anterior approach    Referring Provider (PT) Edmonia Lynch                         [facility] Adult PT Treatment/Exercise - 04/12/20 0001      Ambulation/Gait   Ambulation/Gait Yes    Ambulation Distance (Feet) 226 Feet    Gait Comments limp noted ; focus on trunk rotation with arm swing - 5 minutes, focus on decreased lateral sway 5 minutes      Knee/Hip Exercises: Standing   Other Standing Knee Exercises standing QL stretch at wall -x15 R side only 5" holds; wrist flexor stertch x5 5" holds B     Other Standing Knee Exercises standing leg hovers with UE support and 2 " step - visual cues x6 30" B                     PT Short Term Goals - 04/07/20 1458  PT SHORT TERM GOAL #1   Title Patient will be able to walk at least 226 feet in 2 minutes with no assistive device to demonstrate improved functional mobility    Baseline 04/07/20:  2WMT 458ft no AD, noted antalgic gait mechanics upon fatigue and increased speed.  (was 44ft with use of RW)    Status Achieved      PT SHORT TERM GOAL #2   Title Patient will report at least 50% improvement in overall symptoms and/or function to demonstrate improved functional mobility    Baseline 04/07/20:  Reports he feels 60-70% in overall symptoms    Status Achieved      PT SHORT TERM GOAL #3   Title Patient will be independent in self management strategies to improve quality of life and functional outcomes.    Baseline 04/07/20:  Reports compliance with walking program 15-20 minutes and HEP daily.    Status Achieved             PT Long Term Goals - 04/07/20 1500      PT LONG TERM GOAL #1   Title Patient will be able to pick up light box off the floor with good mechanics without pain to improve ability to lift items as needed    Baseline 11/10: ongoing, tendency to bend wiht back and cueing required for good mechanics     Status On-going      PT LONG TERM GOAL #2   Title Patient will be able to ascend and descend flight of stairs with step through gait pattern with railing as needed to improve ability to get to second floor in home    Baseline 04/07/20: Able to demonstrate reciprocal pattern 7in step height 1 HR A    Status Achieved      PT LONG TERM GOAL #3   Title Patient will improve on FOTO score to meet predicted outcomes to demonstrate improved functional mobility.    Baseline 04/07/20:                 Plan - 04/12/20 1400    Clinical Impression Statement Session focused on gait secondary to continued limp noted with walking. Tolerated this well as well as exercises that targeted level hips with visual cues. Fatigue in hips noted end of session. Improved walking mechanic's noted end of session with decreased limp and lateral sway. Will continue with gait mechanics and core strengthening as tolerated.    Personal Factors and Comorbidities Comorbidity 1    Comorbidities HTN    Examination-Activity Limitations Bend;Carry;Stand;Transfers;Lift;Locomotion Level;Reach Overhead;Stairs;Squat;Sleep;Bed Mobility;Bathing;Sit    Examination-Participation Restrictions Cleaning;Driving;Meal Prep;Occupation;Yard Work;Shop    Stability/Clinical Decision Making Stable/Uncomplicated    Rehab Potential Excellent    PT Frequency 3x / week    PT Duration 6 weeks    PT Treatment/Interventions ADLs/Self Care Home Management;Aquatic Therapy;Cryotherapy;Electrical Stimulation;Moist Heat;Traction;Balance training;Therapeutic exercise;Therapeutic activities;Functional mobility training;Stair training;Gait training;DME Instruction;Neuromuscular re-education;Patient/family education;Manual techniques;Dry needling;Passive range of motion;Scar mobilization;Joint Manipulations    PT Next Visit Plan core strengthening, walking mechanics.    PT Home Exercise Plan texted to pt from [place]- hip add/abd isometrics, LAQs, quad  sets, ankle pumps 10/29 bridge, clam, heel slides, hip abd standing, marches supine 11/1 step up forward and lateral, squats    Consulted and Agree with Plan of Care Patient           Patient will benefit from skilled therapeutic intervention in order to improve the following deficits and impairments:  Pain, Decreased activity tolerance, Decreased mobility, Decreased skin  integrity, Abnormal gait, Increased edema, Decreased strength, Difficulty walking, Decreased balance, Decreased range of motion  Visit Diagnosis: Difficulty in walking, not elsewhere classified  Other abnormalities of gait and mobility  Stiffness of right hip, not elsewhere classified     Problem List Patient Active Problem List   Diagnosis Date Noted  . S/P rotator cuff repair 02/10/2014  . Pain in joint, shoulder region 02/10/2014  . Muscle weakness (generalized) 02/10/2014  . Decreased range of motion of right shoulder 02/10/2014    2:02 PM, 04/12/20 Jerene Pitch, DPT Physical Therapy with [facility]  [phone] office  [place] [address] [place], [place], [address] Phone: [phone]   Fax:  [phone]  Name: Marcus Chen MRN: [id number] Date of Birth: [birth year]

## 2020-04-14 ENCOUNTER — Ambulatory Visit (HOSPITAL_COMMUNITY): Payer: BC Managed Care – PPO

## 2020-04-14 ENCOUNTER — Telehealth (HOSPITAL_COMMUNITY): Payer: Self-pay

## 2020-04-14 NOTE — Telephone Encounter (Signed)
S/w pt He cx today on phone tree - he will be here on Friday

## 2020-04-16 ENCOUNTER — Ambulatory Visit (HOSPITAL_COMMUNITY): Payer: BC Managed Care – PPO

## 2020-04-16 ENCOUNTER — Other Ambulatory Visit: Payer: Self-pay

## 2020-04-16 DIAGNOSIS — R2689 Other abnormalities of gait and mobility: Secondary | ICD-10-CM

## 2020-04-16 DIAGNOSIS — M25651 Stiffness of right hip, not elsewhere classified: Secondary | ICD-10-CM | POA: Diagnosis not present

## 2020-04-16 DIAGNOSIS — R262 Difficulty in walking, not elsewhere classified: Secondary | ICD-10-CM

## 2020-04-16 DIAGNOSIS — M25551 Pain in right hip: Secondary | ICD-10-CM | POA: Diagnosis not present

## 2020-04-16 NOTE — Therapy (Signed)
[place], [place], [address] Phone: [phone]   Fax:  [phone]  Physical Therapy Treatment  Patient Details  Name: Marcus Chen MRN: [id number] Date of Birth: 07-28-1965 Referring Provider (PT): Edmonia Lynch   Encounter Date: 04/16/2020   PT End of Session - 04/16/20 1159    Visit Number 9    Number of Visits 18    Date for PT Re-Evaluation 05/05/20    Authorization Type BCBS VL 30, no auth, secondary, UHC VL 60 no auth    Authorization - Visit Number 8    Authorization - Number of Visits 30    PT Start Time 1202    PT Stop Time 1243    PT Time Calculation (min) 41 min    Activity Tolerance Patient tolerated treatment well    Behavior During Therapy [facility] for tasks assessed/performed           Past Medical History:  Diagnosis Date  . AC (acromioclavicular) joint bone spurs    spurs  . Allergy   . Arthritis   . Asthma    season,pet alergies  . Gout   . History of kidney stones   . Kidney stone   . Post-operative nausea and vomiting    nausea and vomitting  . Shoulder pain, bilateral     Past Surgical History:  Procedure Laterality Date  . APPENDECTOMY    . ROTATOR CUFF REPAIR  2014   right/ left shoulder was done 2010  . stomach reduction  2003  . TONSILLECTOMY    . TOTAL HIP ARTHROPLASTY Right 03/23/2020   Procedure: TOTAL HIP ARTHROPLASTY ANTERIOR APPROACH;  Surgeon: Renette Butters, MD;  Location: [facility];  Service: Orthopedics;  Laterality: Right;    There were no vitals filed for this visit.   Subjective Assessment - 04/16/20 1158    Subjective Stiffness when he first gets up and moves around; feels he has been a bit lazy so far today and hasn't moved around much, so he is stiff. Has been working on walking more evenly since last session.    Currently in Pain? No/denies    Pain Descriptors / Indicators Other (Comment)   stiff           [facility] Adult PT Treatment/Exercise -  04/16/20 0001      Ambulation/Gait   Ambulation/Gait Yes    Ambulation Distance (Feet) 500 Feet    Assistive device None    Gait Pattern Step-through pattern;Right flexed knee in stance;Lateral hip instability;Lateral trunk lean to right;Decreased trunk rotation    Ambulation Surface Level;Indoor    Gait Comments limp noted ; focus on trunk rotation with arm swing - 5 minutes, focus on decreased lateral sway 5 minutes   Mirror utilized; improved since last session     Knee/Hip Exercises: Stretches   Hip Flexor Stretch Right;2 reps;30 seconds    Other Knee/Hip Stretches sitting on haunches "bulldog" stretch for shoulder elbow and wrist mobility 20 sec x3      Knee/Hip Exercises: Standing   Hip Extension Stengthening;Both;2 sets;10 reps;Knee straight    Functional Squat 2 sets;10 reps   in mirror   SLS 8 sec x2 max   in mirror   SLS with Vectors 4x 5 sec holds each position   in mirror   Other Standing Knee Exercises standing QL stretch at wall -x15 R side only 5" holds    Other Standing Knee Exercises standing leg hovers with UE  support and 2 " step - visual cues x3 30" B ; 2" hip hike on RLE x10 2 sets      Knee/Hip Exercises: Prone   Other Prone Exercises quadraped leg lift hip extension x10 bilateral             PT Education - 04/16/20 1251    Education Details Discussed purpose and technique of interventions throughout sessions.    Person(s) Educated Patient    Methods Explanation    Comprehension Verbalized understanding            PT Short Term Goals - 04/16/20 1300      PT SHORT TERM GOAL #1   Title Patient will be able to walk at least 226 feet in 2 minutes with no assistive device to demonstrate improved functional mobility    Baseline 04/07/20:  2WMT 46ft no AD, noted antalgic gait mechanics upon fatigue and increased speed.  (was 70ft with use of RW)    Status Achieved      PT SHORT TERM GOAL #2   Title Patient will report at least 50% improvement in overall  symptoms and/or function to demonstrate improved functional mobility    Baseline 04/07/20:  Reports he feels 60-70% in overall symptoms    Status Achieved      PT SHORT TERM GOAL #3   Title Patient will be independent in self management strategies to improve quality of life and functional outcomes.    Baseline 04/07/20:  Reports compliance with walking program 15-20 minutes and HEP daily.    Status Achieved             PT Long Term Goals - 04/16/20 1300      PT LONG TERM GOAL #1   Title Patient will be able to pick up light box off the floor with good mechanics without pain to improve ability to lift items as needed    Baseline 11/10: ongoing, tendency to bend wiht back and cueing required for good mechanics    Status On-going      PT LONG TERM GOAL #2   Title Patient will be able to ascend and descend flight of stairs with step through gait pattern with railing as needed to improve ability to get to second floor in home    Baseline 04/07/20: Able to demonstrate reciprocal pattern 7in step height 1 HR A    Status Achieved      PT LONG TERM GOAL #3   Title Patient will improve on FOTO score to meet predicted outcomes to demonstrate improved functional mobility.    Baseline 04/07/20:            Plan - 04/16/20 1159    Clinical Impression Statement Session focused on gait training, strengthening, balance training and flexibility/mobility. Gait pattern is improved since last session but patient continues to require multi-modal cues and visual training to limit lateral trunk sway and pelvic obliquity during gait and standing activities. Added hip hike on 2 inch step, performed SLS with max hold of 8 seconds on right lower extremity and performed SLS with vectors bilaterally with UE support required. Supine iliopsoas stretch performed. Added standing hip extension bilaterally and quadraped hip extension bilaterally. Patient complaining of decreased shoulder, elbow and wrist motion with  pain all around bilateral elbows. Demonstrated "bulldog" stretch sitting on haunches for flexibility in shoulders, elbows and wrists. Patient reported muscle fatigue in bilateral hips and quadriceps post session. Right hip flexibility limited by complaints of groin discomfort and  stretch rather than anterior hip. Patient would continue to benefit from continued skilled physical therapy to reduce impairment, improve QOL and improved functional performance.    Personal Factors and Comorbidities Comorbidity 1    Comorbidities HTN    Examination-Activity Limitations Bend;Carry;Stand;Transfers;Lift;Locomotion Level;Reach Overhead;Stairs;Squat;Sleep;Bed Mobility;Bathing;Sit    Examination-Participation Restrictions Cleaning;Driving;Meal Prep;Occupation;Yard Work;Shop    Stability/Clinical Decision Making Stable/Uncomplicated    Rehab Potential Excellent    PT Frequency 3x / week    PT Duration 6 weeks    PT Treatment/Interventions ADLs/Self Care Home Management;Aquatic Therapy;Cryotherapy;Electrical Stimulation;Moist Heat;Traction;Balance training;Therapeutic exercise;Therapeutic activities;Functional mobility training;Stair training;Gait training;DME Instruction;Neuromuscular re-education;Patient/family education;Manual techniques;Dry needling;Passive range of motion;Scar mobilization;Joint Manipulations    PT Next Visit Plan core strengthening, walking mechanics; SLS, balance activities, continued flexibility    PT Home Exercise Plan texted to pt from [facility]- hip add/abd isometrics, LAQs, quad sets, ankle pumps 10/29 bridge, clam, heel slides, hip abd standing, marches supine 11/1 step up forward and lateral, squats    Consulted and Agree with Plan of Care Patient           Patient will benefit from skilled therapeutic intervention in order to improve the following deficits and impairments:  Pain, Decreased activity tolerance, Decreased mobility, Decreased skin integrity, Abnormal gait, Increased  edema, Decreased strength, Difficulty walking, Decreased balance, Decreased range of motion  Visit Diagnosis: Difficulty in walking, not elsewhere classified  Other abnormalities of gait and mobility  Stiffness of right hip, not elsewhere classified     Problem List Patient Active Problem List   Diagnosis Date Noted  . S/P rotator cuff repair 02/10/2014  . Pain in joint, shoulder region 02/10/2014  . Muscle weakness (generalized) 02/10/2014  . Decreased range of motion of right shoulder 02/10/2014    Floria Raveling. Hartnett-Rands, MS, PT [facility] [place] #[id number] 04/16/2020, 1:02 PM  [place] [address] [place], [place], [address] Phone: [phone]   Fax:  [phone]  Name: Marcus Chen MRN: [id number] Date of Birth: [birth year]

## 2020-04-19 ENCOUNTER — Encounter (HOSPITAL_COMMUNITY): Payer: Self-pay | Admitting: Physical Therapy

## 2020-04-19 ENCOUNTER — Other Ambulatory Visit: Payer: Self-pay

## 2020-04-19 ENCOUNTER — Ambulatory Visit (HOSPITAL_COMMUNITY): Payer: BC Managed Care – PPO | Admitting: Physical Therapy

## 2020-04-19 DIAGNOSIS — R2689 Other abnormalities of gait and mobility: Secondary | ICD-10-CM | POA: Diagnosis not present

## 2020-04-19 DIAGNOSIS — R262 Difficulty in walking, not elsewhere classified: Secondary | ICD-10-CM

## 2020-04-19 DIAGNOSIS — M25551 Pain in right hip: Secondary | ICD-10-CM | POA: Diagnosis not present

## 2020-04-19 DIAGNOSIS — M25651 Stiffness of right hip, not elsewhere classified: Secondary | ICD-10-CM | POA: Diagnosis not present

## 2020-04-19 NOTE — Therapy (Signed)
[place], [place], [address] Phone: [phone]   Fax:  [phone]  Physical Therapy Treatment, Progress note, RECERT  Patient Details  Name: Marcus Chen MRN: [id number] Date of Birth: 03/21/1966 Referring Provider (PT): Edmonia Lynch  Progress Note Reporting Period 03/24/20 to 04/19/20  See note below for Objective Data and Assessment of Progress/Goals.      Encounter Date: 04/19/2020   PT End of Session - 04/19/20 1330    Visit Number 10    Number of Visits 22    Date for PT Re-Evaluation 05/31/20    Authorization Type BCBS VL 30, no auth, secondary, UHC VL 60 no auth    Authorization - Visit Number 10    Authorization - Number of Visits 30    PT Start Time 3009    PT Stop Time 1356    PT Time Calculation (min) 38 min    Activity Tolerance Patient tolerated treatment well    Behavior During Therapy WFL for tasks assessed/performed           Past Medical History:  Diagnosis Date  . AC (acromioclavicular) joint bone spurs    spurs  . Allergy   . Arthritis   . Asthma    season,pet alergies  . Gout   . History of kidney stones   . Kidney stone   . Post-operative nausea and vomiting    nausea and vomitting  . Shoulder pain, bilateral     Past Surgical History:  Procedure Laterality Date  . APPENDECTOMY    . ROTATOR CUFF REPAIR  2014   right/ left shoulder was done 2010  . stomach reduction  2003  . TONSILLECTOMY    . TOTAL HIP ARTHROPLASTY Right 03/23/2020   Procedure: TOTAL HIP ARTHROPLASTY ANTERIOR APPROACH;  Surgeon: Renette Butters, MD;  Location: [facility];  Service: Orthopedics;  Laterality: Right;    There were no vitals filed for this visit.   Subjective Assessment - 04/19/20 1321    Subjective States he feels walking is getting easier. States that he has minor soreness along the lateral side of his leg but that has been there forever. States overall he feels about 85%  better since the start of PT. States he is walking stairs and doing things that used to bother him.    Currently in Pain? No/denies              [facility] PT Assessment - 04/19/20 0001      Assessment   Medical Diagnosis R THA anterior approach    Referring Provider (PT) Edmonia Lynch      Observation/Other Assessments   Focus on Therapeutic Outcomes (FOTO)  69% function   inital 4%      ROM / Strength   AROM / PROM / Strength Strength      Strength   Overall Strength Comments regular plank - 30 seconds, side plank L down 9 seconds, side plank  right side down 14 seconds    Strength Assessment Site Hip;Knee;Ankle    Right/Left Hip Right;Left    Right Hip Flexion 4+/5    Right Hip Extension 3+/5   mild discomfort/stretch front of hip   Right Hip ABduction 4/5    Left Hip Flexion 4+/5    Left Hip Extension 4/5   discomfort/stretch on right   Left Hip ABduction 4+/5    Right/Left Knee Right;Left    Right Knee Flexion 4+/5    Right Knee  Extension 4+/5    Left Knee Flexion 5/5    Left Knee Extension 5/5    Right/Left Ankle Right;Left    Right Ankle Dorsiflexion 5/5    Right Ankle Plantar Flexion 5/5   17 SL heel raises - partial height   Left Ankle Dorsiflexion 5/5    Left Ankle Plantar Flexion 5/5   20 SL Heel raises                        [facility] Adult PT Treatment/Exercise - 04/19/20 0001      Knee/Hip Exercises: Prone   Hamstring Curl 15 reps;5 seconds   bilateral   Hip Extension AROM;Strengthening;Both;4 sets;5 reps   within comfortable range                 PT Education - 04/19/20 1356    Education Details on progress, on POC, on HEP and FOTO score    Person(s) Educated Patient    Methods Explanation    Comprehension Verbalized understanding            PT Short Term Goals - 04/16/20 1300      PT SHORT TERM GOAL #1   Title Patient will be able to walk at least 226 feet in 2 minutes with no assistive device to demonstrate improved  functional mobility    Baseline 04/07/20:  2WMT 434ft no AD, noted antalgic gait mechanics upon fatigue and increased speed.  (was 84ft with use of RW)    Status Achieved      PT SHORT TERM GOAL #2   Title Patient will report at least 50% improvement in overall symptoms and/or function to demonstrate improved functional mobility    Baseline 04/07/20:  Reports he feels 60-70% in overall symptoms    Status Achieved      PT SHORT TERM GOAL #3   Title Patient will be independent in self management strategies to improve quality of life and functional outcomes.    Baseline 04/07/20:  Reports compliance with walking program 15-20 minutes and HEP daily.    Status Achieved             PT Long Term Goals - 04/19/20 1328      PT LONG TERM GOAL #1   Title Patient will be able to pick up light box off the floor with good mechanics without pain to improve ability to lift items as needed    Baseline --    Status Achieved      PT LONG TERM GOAL #2   Title Patient will be able to ascend and descend flight of stairs with step through gait pattern with railing as needed to improve ability to get to second floor in home    Baseline 04/07/20: Able to demonstrate reciprocal pattern 7in step height 1 HR A    Status Achieved      PT LONG TERM GOAL #3   Title Patient will improve on FOTO score to meet predicted outcomes to demonstrate improved functional mobility.    Status Achieved      PT LONG TERM GOAL #4   Title Patient will be able to climb 5 flights of stairs (up and down) repetitiously to demonstrate improved lower extremity endurance and improve ability to perform required work tasks.    Time 6    Period Weeks    Target Date 05/31/20      PT LONG TERM GOAL #5   Title Patient will be able to  hold side plank for 20 seconds on either side to demonstrate improved core strength    Time 6    Period Weeks    Status New    Target Date 05/31/20      Additional Long Term Goals   Additional Long  Term Goals Yes      PT LONG TERM GOAL #6   Title Patient will be able to hold regular plank for 60 seconds to demonstrate improved core strength    Time 6    Period Weeks    Status New    Target Date 05/31/20      PT LONG TERM GOAL #7   Title Patient will be able to perform squat with good mechanics with knee flexion >90 degrees to improve ability to pick up heavy items for work.    Time 6    Period Weeks    Status New    Target Date 05/31/20                 Plan - 04/19/20 1402    Clinical Impression Statement Patient present for a progress note on this date. He has made all short and long term goals at this time. Patient with continued functional and strength limitations and is very motivated to return to optimal function so he can perform required work related tasks. Educated patient in plan moving forward and answered all questions. Extending POC additional 6 weeks at 2x/week to continue to work on higher level tasks: climbing ladders, deeper squats, and core strength so patient will improve ability to perform work and home related tasks. Patient will continue to benefit from skilled physical therapy at this time.    Personal Factors and Comorbidities Comorbidity 1    Comorbidities HTN    Examination-Activity Limitations Bend;Carry;Stand;Transfers;Lift;Locomotion Level;Reach Overhead;Stairs;Squat;Sleep;Bed Mobility;Bathing;Sit    Examination-Participation Restrictions Cleaning;Driving;Meal Prep;Occupation;Yard Work;Shop    Stability/Clinical Decision Making Stable/Uncomplicated    Rehab Potential Excellent    PT Frequency 2x / week    PT Duration 6 weeks    PT Treatment/Interventions ADLs/Self Care Home Management;Aquatic Therapy;Cryotherapy;Electrical Stimulation;Moist Heat;Traction;Balance training;Therapeutic exercise;Therapeutic activities;Functional mobility training;Stair training;Gait training;DME Instruction;Neuromuscular re-education;Patient/family education;Manual  techniques;Dry needling;Passive range of motion;Scar mobilization;Joint Manipulations    PT Next Visit Plan core strengthening,  continued flexibility, squats, increase challenge to match required functional tasks - as tolerated (squats, lifts, stairs, ladders)    PT Home Exercise Plan texted to pt from [employer]- hip add/abd isometrics, LAQs, quad sets, ankle pumps 10/29 bridge, clam, heel slides, hip abd standing, marches supine 11/1 step up forward and lateral, squats; 11/22 planks    Consulted and Agree with Plan of Care Patient           Patient will benefit from skilled therapeutic intervention in order to improve the following deficits and impairments:  Pain, Decreased activity tolerance, Decreased mobility, Decreased skin integrity, Abnormal gait, Increased edema, Decreased strength, Difficulty walking, Decreased balance, Decreased range of motion  Visit Diagnosis: Difficulty in walking, not elsewhere classified  Other abnormalities of gait and mobility     Problem List Patient Active Problem List   Diagnosis Date Noted  . S/P rotator cuff repair 02/10/2014  . Pain in joint, shoulder region 02/10/2014  . Muscle weakness (generalized) 02/10/2014  . Decreased range of motion of right shoulder 02/10/2014   4:07 PM, 04/19/20 Jerene Pitch, DPT Physical Therapy with [facility]  [phone] office  [place] [address] [place], [place], [address] Phone:  [phone]   Fax:  [phone]  Name: Marcus Chen MRN: [id number] Date of Birth: [birth year]

## 2020-04-21 ENCOUNTER — Ambulatory Visit (HOSPITAL_COMMUNITY): Payer: BC Managed Care – PPO | Admitting: Physical Therapy

## 2020-04-21 ENCOUNTER — Other Ambulatory Visit: Payer: Self-pay

## 2020-04-21 ENCOUNTER — Encounter (HOSPITAL_COMMUNITY): Payer: Self-pay | Admitting: Physical Therapy

## 2020-04-21 DIAGNOSIS — R262 Difficulty in walking, not elsewhere classified: Secondary | ICD-10-CM | POA: Diagnosis not present

## 2020-04-21 DIAGNOSIS — R2689 Other abnormalities of gait and mobility: Secondary | ICD-10-CM

## 2020-04-21 DIAGNOSIS — M25551 Pain in right hip: Secondary | ICD-10-CM | POA: Diagnosis not present

## 2020-04-21 DIAGNOSIS — M25651 Stiffness of right hip, not elsewhere classified: Secondary | ICD-10-CM | POA: Diagnosis not present

## 2020-04-21 NOTE — Therapy (Signed)
[place], [place], [address] Phone: [phone]   Fax:  [phone]  Physical Therapy Treatment  Patient Details  Name: Marcus Chen MRN: [id number] Date of Birth: 1965/06/20 Referring Provider (PT): Edmonia Lynch   Encounter Date: 04/21/2020   PT End of Session - 04/21/20 1324    Visit Number 11    Number of Visits 22    Date for PT Re-Evaluation 05/31/20    Authorization Type BCBS VL 30, no auth, secondary, UHC VL 60 no auth    Authorization - Visit Number 11    Authorization - Number of Visits 30    PT Start Time 5009    PT Stop Time 1357    PT Time Calculation (min) 38 min    Activity Tolerance Patient tolerated treatment well    Behavior During Therapy WFL for tasks assessed/performed           Past Medical History:  Diagnosis Date  . AC (acromioclavicular) joint bone spurs    spurs  . Allergy   . Arthritis   . Asthma    season,pet alergies  . Gout   . History of kidney stones   . Kidney stone   . Post-operative nausea and vomiting    nausea and vomitting  . Shoulder pain, bilateral     Past Surgical History:  Procedure Laterality Date  . APPENDECTOMY    . ROTATOR CUFF REPAIR  2014   right/ left shoulder was done 2010  . stomach reduction  2003  . TONSILLECTOMY    . TOTAL HIP ARTHROPLASTY Right 03/23/2020   Procedure: TOTAL HIP ARTHROPLASTY ANTERIOR APPROACH;  Surgeon: Renette Butters, MD;  Location: [facility];  Service: Orthopedics;  Laterality: Right;    There were no vitals filed for this visit.   Subjective Assessment - 04/21/20 1323    Subjective States that he went to the office yesterday and he had a 6 hour round trip and feels really tight right now.    Currently in Pain? Yes    Pain Score 4     Pain Location Hip    Pain Orientation Right    Pain Descriptors / Indicators Tightness              [facility] PT Assessment - 04/21/20 0001      Assessment   Medical  Diagnosis R THA anterior approach    Referring Provider (PT) Edmonia Lynch                         [facility] Adult PT Treatment/Exercise - 04/21/20 0001      Knee/Hip Exercises: Aerobic   Stationary Bike 5 minutes for ROM - during which subjective was taken and education provided      Knee/Hip Exercises: Standing   Other Standing Knee Exercises standing leg hovers with UE support and 2 " step x3 30" R; lateral steps x5 B ; monster walk - 15 feet - no resistance x12      Knee/Hip Exercises: Supine   Other Supine Knee/Hip Exercises self mobilization to right hip musculature - tolerated well ; shoulder protraction -  x5 5" holds; deadlifts wiht box elevated - 5 minutes practice; trialed hip hinge - difficult - 5 minutes of work. stir the pot with theraband green - 4x5 B clockwise and counter clockwise    Other Supine Knee/Hip Exercises squat to table - focus on keeping trunk upright and hip  ROM - 3x15                  PT Education - 04/21/20 1325    Education Details on HEP, on movement based exercises to loosen up hip.    Person(s) Educated Patient    Methods Explanation    Comprehension Verbalized understanding            PT Short Term Goals - 04/16/20 1300      PT SHORT TERM GOAL #1   Title Patient will be able to walk at least 226 feet in 2 minutes with no assistive device to demonstrate improved functional mobility    Baseline 04/07/20:  2WMT 487ft no AD, noted antalgic gait mechanics upon fatigue and increased speed.  (was 71ft with use of RW)    Status Achieved      PT SHORT TERM GOAL #2   Title Patient will report at least 50% improvement in overall symptoms and/or function to demonstrate improved functional mobility    Baseline 04/07/20:  Reports he feels 60-70% in overall symptoms    Status Achieved      PT SHORT TERM GOAL #3   Title Patient will be independent in self management strategies to improve quality of life and functional outcomes.     Baseline 04/07/20:  Reports compliance with walking program 15-20 minutes and HEP daily.    Status Achieved             PT Long Term Goals - 04/19/20 1328      PT LONG TERM GOAL #1   Title Patient will be able to pick up light box off the floor with good mechanics without pain to improve ability to lift items as needed    Baseline --    Status Achieved      PT LONG TERM GOAL #2   Title Patient will be able to ascend and descend flight of stairs with step through gait pattern with railing as needed to improve ability to get to second floor in home    Baseline 04/07/20: Able to demonstrate reciprocal pattern 7in step height 1 HR A    Status Achieved      PT LONG TERM GOAL #3   Title Patient will improve on FOTO score to meet predicted outcomes to demonstrate improved functional mobility.    Status Achieved      PT LONG TERM GOAL #4   Title Patient will be able to climb 5 flights of stairs (up and down) repetitiously to demonstrate improved lower extremity endurance and improve ability to perform required work tasks.    Time 6    Period Weeks    Target Date 05/31/20      PT LONG TERM GOAL #5   Title Patient will be able to hold side plank for 20 seconds on either side to demonstrate improved core strength    Time 6    Period Weeks    Status New    Target Date 05/31/20      Additional Long Term Goals   Additional Long Term Goals Yes      PT LONG TERM GOAL #6   Title Patient will be able to hold regular plank for 60 seconds to demonstrate improved core strength    Time 6    Period Weeks    Status New    Target Date 05/31/20      PT LONG TERM GOAL #7   Title Patient will be able to perform squat  with good mechanics with knee flexion >90 degrees to improve ability to pick up heavy items for work.    Time 6    Period Weeks    Status New    Target Date 05/31/20                 Plan - 04/21/20 1351    Clinical Impression Statement Focused on improve ROM initially  which was tolerated well. Improved mobility and reduced stiffness in hip noted afterwards. Continued with progression of strengthening exercises which was tolerated well. Fatigue in legs noted end of session. Will continue with current POC.    Personal Factors and Comorbidities Comorbidity 1    Comorbidities HTN    Examination-Activity Limitations Bend;Carry;Stand;Transfers;Lift;Locomotion Level;Reach Overhead;Stairs;Squat;Sleep;Bed Mobility;Bathing;Sit    Examination-Participation Restrictions Cleaning;Driving;Meal Prep;Occupation;Yard Work;Shop    Stability/Clinical Decision Making Stable/Uncomplicated    Rehab Potential Excellent    PT Frequency 2x / week    PT Duration 6 weeks    PT Treatment/Interventions ADLs/Self Care Home Management;Aquatic Therapy;Cryotherapy;Electrical Stimulation;Moist Heat;Traction;Balance training;Therapeutic exercise;Therapeutic activities;Functional mobility training;Stair training;Gait training;DME Instruction;Neuromuscular re-education;Patient/family education;Manual techniques;Dry needling;Passive range of motion;Scar mobilization;Joint Manipulations    PT Next Visit Plan core strengthening,  continued flexibility, squats, increase challenge to match required functional tasks - as tolerated (squats, lifts, stairs, ladders)    PT Home Exercise Plan texted to pt from [facility]- hip add/abd isometrics, LAQs, quad sets, ankle pumps 10/29 bridge, clam, heel slides, hip abd standing, marches supine 11/1 step up forward and lateral, squats; 11/22 planks    Consulted and Agree with Plan of Care Patient           Patient will benefit from skilled therapeutic intervention in order to improve the following deficits and impairments:  Pain, Decreased activity tolerance, Decreased mobility, Decreased skin integrity, Abnormal gait, Increased edema, Decreased strength, Difficulty walking, Decreased balance, Decreased range of motion  Visit Diagnosis: Difficulty in walking, not  elsewhere classified  Other abnormalities of gait and mobility  Stiffness of right hip, not elsewhere classified  Pain in right hip     Problem List Patient Active Problem List   Diagnosis Date Noted  . S/P rotator cuff repair 02/10/2014  . Pain in joint, shoulder region 02/10/2014  . Muscle weakness (generalized) 02/10/2014  . Decreased range of motion of right shoulder 02/10/2014   1:56 PM, 04/21/20 Jerene Pitch, DPT Physical Therapy with [facility]  [phone] office  [place] [address] [place], [place], [address] Phone: [phone]   Fax:  [phone]  Name: Marcus Chen MRN: [id number] Date of Birth: Nov 19, 1965

## 2020-04-26 ENCOUNTER — Ambulatory Visit (HOSPITAL_COMMUNITY): Payer: BC Managed Care – PPO | Admitting: Physical Therapy

## 2020-04-26 ENCOUNTER — Telehealth (HOSPITAL_COMMUNITY): Payer: Self-pay | Admitting: Physical Therapy

## 2020-04-26 NOTE — Telephone Encounter (Signed)
No Show. Called patient and patient confused about appointment times. Confirmed next appointment with patient.   2:03 PM, 04/26/20 Jerene Pitch, DPT Physical Therapy with [facility]  [phone] office

## 2020-04-28 ENCOUNTER — Telehealth (HOSPITAL_COMMUNITY): Payer: Self-pay | Admitting: Physical Therapy

## 2020-04-28 ENCOUNTER — Ambulatory Visit (HOSPITAL_COMMUNITY): Payer: BC Managed Care – PPO | Admitting: Physical Therapy

## 2020-04-28 NOTE — Telephone Encounter (Signed)
pt called to cx this appt has a meeting  at work.

## 2020-04-30 ENCOUNTER — Ambulatory Visit (HOSPITAL_COMMUNITY): Payer: BC Managed Care – PPO

## 2020-05-05 ENCOUNTER — Ambulatory Visit (HOSPITAL_COMMUNITY): Payer: BC Managed Care – PPO | Attending: Orthopedic Surgery

## 2020-05-05 ENCOUNTER — Other Ambulatory Visit: Payer: Self-pay

## 2020-05-05 ENCOUNTER — Encounter (HOSPITAL_COMMUNITY): Payer: Self-pay

## 2020-05-05 DIAGNOSIS — M25651 Stiffness of right hip, not elsewhere classified: Secondary | ICD-10-CM | POA: Insufficient documentation

## 2020-05-05 DIAGNOSIS — R262 Difficulty in walking, not elsewhere classified: Secondary | ICD-10-CM | POA: Insufficient documentation

## 2020-05-05 DIAGNOSIS — M25551 Pain in right hip: Secondary | ICD-10-CM | POA: Diagnosis not present

## 2020-05-05 DIAGNOSIS — R2689 Other abnormalities of gait and mobility: Secondary | ICD-10-CM | POA: Diagnosis not present

## 2020-05-05 DIAGNOSIS — M25522 Pain in left elbow: Secondary | ICD-10-CM | POA: Diagnosis not present

## 2020-05-05 NOTE — Therapy (Addendum)
[facility] [address] [place], [place], [address] Phone: [phone]   Fax:  [phone]  Physical Therapy Treatment and Discharge Summary  Patient Details  Name: Marcus Chen MRN: [id number] Date of Birth: 1965-12-14 Referring Provider (PT): Edmonia Lynch  PHYSICAL THERAPY DISCHARGE SUMMARY  Visits from Start of Care: 12  Current functional level related to goals / functional outcomes: See below   Remaining deficits: none   Education / Equipment: See below  Plan: Patient agrees to discharge.  Patient goals were partially met. Patient is being discharged due to meeting the stated rehab goals.  ?????     3:39 PM, 05/05/20 Jerene Pitch, DPT Physical Therapy with [facility]  [phone] office   Encounter Date: 05/05/2020   PT End of Session - 05/05/20 0849    Visit Number 12    Number of Visits 22    Date for PT Re-Evaluation 05/31/20    Authorization Type BCBS VL 30, no auth, secondary, UHC VL 60 no auth    Authorization - Visit Number 12    Authorization - Number of Visits 30    PT Start Time [phone]   late sign in   PT Stop Time 0921    PT Time Calculation (min) 38 min    Activity Tolerance Patient tolerated treatment well    Behavior During Therapy [facility] for tasks assessed/performed           Past Medical History:  Diagnosis Date  . AC (acromioclavicular) joint bone spurs    spurs  . Allergy   . Arthritis   . Asthma    season,pet alergies  . Gout   . History of kidney stones   . Kidney stone   . Post-operative nausea and vomiting    nausea and vomitting  . Shoulder pain, bilateral     Past Surgical History:  Procedure Laterality Date  . APPENDECTOMY    . ROTATOR CUFF REPAIR  2014   right/ left shoulder was done 2010  . stomach reduction  2003  . TONSILLECTOMY    . TOTAL HIP ARTHROPLASTY Right 03/23/2020   Procedure: TOTAL HIP ARTHROPLASTY ANTERIOR APPROACH;  Surgeon:  Renette Butters, MD;  Location: [facility];  Service: Orthopedics;  Laterality: Right;    There were no vitals filed for this visit.   Subjective Assessment - 05/05/20 0846    Subjective Pt stated he is making improvements every day, reports tightness in groin more proximal and deep lateral hip.  Reports activities are returning to normal as long as he doesn't over think it.    Currently in Pain? No/denies              [facility] PT Assessment - 05/05/20 0001      Assessment   Medical Diagnosis R THA anterior approach    Referring Provider (PT) Edmonia Lynch    Onset Date/Surgical Date 03/23/20    Hand Dominance Right    Next MD Visit 05/05/20    Prior Therapy yes      Restrictions   Other Position/Activity Restrictions WBAT      Observation/Other Assessments   Focus on Therapeutic Outcomes (FOTO)  82% functional; 18% limited    was 4% functional     Strength   Right Hip Flexion 4+/5    Right Hip Extension 4/5    Right Hip ABduction 4+/5    Left Hip Flexion 4+/5    Left Hip Extension 4+/5  Left Hip ABduction 4+/5    Right Knee Flexion 5/5    Right Knee Extension 5/5                         [facility] Adult PT Treatment/Exercise - 05/05/20 0001      Ambulation/Gait   Ambulation Distance (Feet) 578 Feet    Assistive device None    Gait Pattern Step-through pattern    Ambulation Surface Level;Indoor    Gait Comments WNL, no limp noted   2MWT     Knee/Hip Exercises: Aerobic   Stationary Bike 5 minutes for ROM - during which subjective was taken and education provided      Knee/Hip Exercises: Standing   Functional Squat 2 sets   2setsx 8 reps for proper lifting 8lb box   Functional Squat Limitations 2setsx 8 reps for proper lifting 8lb box    Stairs 5RT 7in step height no UE A    Other Standing Knee Exercises monster walk 2 RT; squat then walk around block for IR 5x BLE    Other Standing Knee Exercises dead lifts from elevated surface, improved mechanics wiht  reps                    PT Short Term Goals - 05/05/20 0905      PT SHORT TERM GOAL #1   Title Patient will be able to walk at least 226 feet in 2 minutes with no assistive device to demonstrate improved functional mobility    Baseline 05/05/20: 2MWT: 556ft no AD, no antalgic gait mechanics 04/07/20:  2WMT 483ft no AD, noted antalgic gait mechanics upon fatigue and increased speed.  (was 58ft with use of RW)    Status Achieved      PT SHORT TERM GOAL #2   Title Patient will report at least 50% improvement in overall symptoms and/or function to demonstrate improved functional mobility    Baseline 04/07/20:  Reports he feels 60-70% in overall symptoms    Status Achieved      PT SHORT TERM GOAL #3   Title Patient will be independent in self management strategies to improve quality of life and functional outcomes.    Baseline 04/07/20:  Reports compliance with walking program 15-20 minutes and HEP daily.    Status Achieved             PT Long Term Goals - 05/05/20 0905      PT LONG TERM GOAL #1   Title Patient will be able to pick up light box off the floor with good mechanics without pain to improve ability to lift items as needed    Baseline 05/05/20:  Pt able to verbalize and demonstrate good mechanics lifting 8lb box, no pain min cueing to reduce thoracic flexion.  11/10: ongoing, tendency to bend wiht back and cueing required for good mechanics    Status Achieved      PT LONG TERM GOAL #2   Title Patient will be able to ascend and descend flight of stairs with step through gait pattern with railing as needed to improve ability to get to second floor in home    Baseline 05/05/20:  Able to demonstrate reciprocal pattern 7in step height no HHA; 04/07/20: Able to demonstrate reciprocal pattern 7in step height 1 HR A    Status Achieved      PT LONG TERM GOAL #3   Title Patient will improve on FOTO score to meet predicted  outcomes to demonstrate improved functional mobility.     Baseline 05/05/20:  82% functional, was 4% functional initial eval.    Status Achieved      PT LONG TERM GOAL #4   Title Patient will be able to climb 5 flights of stairs (up and down) repetitiously to demonstrate improved lower extremity endurance and improve ability to perform required work tasks.    Baseline 05/05/20:  Able to demonstrate reciprocal pattern 7in step height no HHA; 04/07/20: Able to demonstrate reciprocal pattern 7in step height 1 HR A    Status Achieved      PT LONG TERM GOAL #5   Title Patient will be able to hold side plank for 20 seconds on either side to demonstrate improved core strength    Baseline 05/05/20: Not assessed this session as pt c/o Lt elbow pain, noted inability to fully extend elbow.  Did discuss modified plank positions to assist with elbow pain.    Status Deferred      PT LONG TERM GOAL #6   Title Patient will be able to hold regular plank for 60 seconds to demonstrate improved core strength    Baseline 05/05/20: Not assessed this session as pt c/o Lt elbow pain, noted inability to fully extend elbow.  Did discuss modified plank positions to assist with elbow pain.    Status Deferred      PT LONG TERM GOAL #7   Title Patient will be able to perform squat with good mechanics with knee flexion >90 degrees to improve ability to pick up heavy items for work.    Baseline 05/05/20:  Pt able to demonstrate proper mechanics with squats, good mechanics with proper lifting from ground.    Status Achieved                 Plan - 05/05/20 1200    Clinical Impression Statement Session focus on improving hip mobility and functional strengthening.  Pt stated he returns to MD later today so reviewed goals with great findings:  Pt has met 3/3 STG and 5/7 LTGs,  Good gait mechanics noted during 2MWT with no limping, able to complete reciprocal pattern wtih stairs with no HR A required.  Pt able to verbalize and demonstrate appropriate mechanics with proper  lifting.  Strength has improved to [facility].  Improved self perceived functional ability noted with FOTO score increase to 82% (was 4% initial eval).  Reviewed current HEP to continue.  The only goals that were not met/assessed this session pertained to ability with planks. pt c/o Lt elbow pain following injury and plans on having MD look at.    Personal Factors and Comorbidities Comorbidity 1    Comorbidities HTN    Examination-Activity Limitations Bend;Carry;Stand;Transfers;Lift;Locomotion Level;Reach Overhead;Stairs;Squat;Sleep;Bed Mobility;Bathing;Sit    Examination-Participation Restrictions Cleaning;Driving;Meal Prep;Occupation;Yard Work;Shop    Stability/Clinical Decision Making Stable/Uncomplicated    Clinical Decision Making Low    Rehab Potential Excellent    PT Frequency 2x / week    PT Duration 6 weeks    PT Treatment/Interventions ADLs/Self Care Home Management;Aquatic Therapy;Cryotherapy;Electrical Stimulation;Moist Heat;Traction;Balance training;Therapeutic exercise;Therapeutic activities;Functional mobility training;Stair training;Gait training;DME Instruction;Neuromuscular re-education;Patient/family education;Manual techniques;Dry needling;Passive range of motion;Scar mobilization;Joint Manipulations    PT Next Visit Plan DC to HEP per all goals met.    PT Home Exercise Plan texted to pt from [place]- hip add/abd isometrics, LAQs, quad sets, ankle pumps 10/29 bridge, clam, heel slides, hip abd standing, marches supine 11/1 step up forward and lateral, squats; 11/22 planks  Patient will benefit from skilled therapeutic intervention in order to improve the following deficits and impairments:  Pain, Decreased activity tolerance, Decreased mobility, Decreased skin integrity, Abnormal gait, Increased edema, Decreased strength, Difficulty walking, Decreased balance, Decreased range of motion  Visit Diagnosis: Other abnormalities of gait and mobility  Stiffness of right hip,  not elsewhere classified  Pain in right hip  Difficulty in walking, not elsewhere classified     Problem List Patient Active Problem List   Diagnosis Date Noted  . S/P rotator cuff repair 02/10/2014  . Pain in joint, shoulder region 02/10/2014  . Muscle weakness (generalized) 02/10/2014  . Decreased range of motion of right shoulder 02/10/2014   Ihor Austin, LPTA/CLT; CBIS [phone]  Aldona Lento 05/05/2020, 12:16 PM  [facility] [address] [place], [place], [address] Phone: [phone]   Fax:  [phone]  Name: JHOSTIN EPPS MRN: [id number] Date of Birth: 11-Nov-1965

## 2020-05-06 ENCOUNTER — Ambulatory Visit (HOSPITAL_COMMUNITY): Payer: BC Managed Care – PPO

## 2020-05-10 ENCOUNTER — Ambulatory Visit (HOSPITAL_COMMUNITY): Payer: BC Managed Care – PPO | Admitting: Physical Therapy

## 2020-05-12 ENCOUNTER — Encounter (HOSPITAL_COMMUNITY): Payer: BC Managed Care – PPO

## 2020-05-14 ENCOUNTER — Encounter (HOSPITAL_COMMUNITY): Payer: BC Managed Care – PPO | Admitting: Physical Therapy

## 2020-05-17 ENCOUNTER — Encounter (HOSPITAL_COMMUNITY): Payer: BC Managed Care – PPO | Admitting: Physical Therapy

## 2020-05-19 ENCOUNTER — Encounter (HOSPITAL_COMMUNITY): Payer: BC Managed Care – PPO | Admitting: Physical Therapy

## 2020-05-25 ENCOUNTER — Encounter (HOSPITAL_COMMUNITY): Payer: BC Managed Care – PPO

## 2020-05-26 ENCOUNTER — Encounter (HOSPITAL_COMMUNITY): Payer: BC Managed Care – PPO

## 2020-05-31 ENCOUNTER — Encounter (HOSPITAL_COMMUNITY): Payer: BC Managed Care – PPO | Admitting: Physical Therapy

## 2020-06-02 ENCOUNTER — Encounter (HOSPITAL_COMMUNITY): Payer: BC Managed Care – PPO | Admitting: Physical Therapy

## 2020-06-07 ENCOUNTER — Encounter (HOSPITAL_COMMUNITY): Payer: BC Managed Care – PPO | Admitting: Physical Therapy

## 2020-06-10 ENCOUNTER — Encounter (HOSPITAL_COMMUNITY): Payer: BC Managed Care – PPO | Admitting: Physical Therapy

## 2020-07-02 DIAGNOSIS — R42 Dizziness and giddiness: Secondary | ICD-10-CM | POA: Diagnosis not present

## 2020-07-02 DIAGNOSIS — Z6839 Body mass index (BMI) 39.0-39.9, adult: Secondary | ICD-10-CM | POA: Diagnosis not present

## 2020-07-02 DIAGNOSIS — I10 Essential (primary) hypertension: Secondary | ICD-10-CM | POA: Diagnosis not present

## 2020-08-11 DIAGNOSIS — H2513 Age-related nuclear cataract, bilateral: Secondary | ICD-10-CM | POA: Diagnosis not present

## 2020-08-11 DIAGNOSIS — H5213 Myopia, bilateral: Secondary | ICD-10-CM | POA: Diagnosis not present

## 2020-09-17 DIAGNOSIS — M25551 Pain in right hip: Secondary | ICD-10-CM | POA: Diagnosis not present

## 2020-09-23 ENCOUNTER — Ambulatory Visit (HOSPITAL_COMMUNITY): Payer: BC Managed Care – PPO | Attending: Orthopedic Surgery | Admitting: Physical Therapy

## 2020-09-23 ENCOUNTER — Encounter (HOSPITAL_COMMUNITY): Payer: Self-pay | Admitting: Physical Therapy

## 2020-09-23 ENCOUNTER — Other Ambulatory Visit: Payer: Self-pay

## 2020-09-23 DIAGNOSIS — R262 Difficulty in walking, not elsewhere classified: Secondary | ICD-10-CM

## 2020-09-23 DIAGNOSIS — M79604 Pain in right leg: Secondary | ICD-10-CM | POA: Insufficient documentation

## 2020-09-23 NOTE — Therapy (Addendum)
[facility] [address] [place], [place], [address] Phone: [phone]   Fax:  [phone]  Physical Therapy Evaluation  Patient Details  Name: JOHNNEY SCARLATA MRN: [id number] Date of Birth: January 24, 1966 Referring Provider (PT): Edmonia Lynch   Encounter Date: 09/23/2020   PT End of Session - 09/23/20 1607    Visit Number 1    Number of Visits 12    Date for PT Re-Evaluation 11/04/20    Authorization Type BCBS/ UHC    Authorization - Number of Visits 30    PT Start Time 1530    PT Stop Time 1610    PT Time Calculation (min) 40 min    Activity Tolerance Patient tolerated treatment well    Behavior During Therapy [facility] for tasks assessed/performed           Past Medical History:  Diagnosis Date  . AC (acromioclavicular) joint bone spurs    spurs  . Allergy   . Arthritis   . Asthma    season,pet alergies  . Gout   . History of kidney stones   . Kidney stone   . Post-operative nausea and vomiting    nausea and vomitting  . Shoulder pain, bilateral     Past Surgical History:  Procedure Laterality Date  . APPENDECTOMY    . ROTATOR CUFF REPAIR  2014   right/ left shoulder was done 2010  . stomach reduction  2003  . TONSILLECTOMY    . TOTAL HIP ARTHROPLASTY Right 03/23/2020   Procedure: TOTAL HIP ARTHROPLASTY ANTERIOR APPROACH;  Surgeon: Renette Butters, MD;  Location: [facility];  Service: Orthopedics;  Laterality: Right;    There were no vitals filed for this visit.    Subjective Assessment - 09/23/20 1530    Subjective Mr. Cofer states that he travels quite a bit.  On Valentines day he had to fly out of town  and ever since has had increased pain in the distal lateral part of his thigh.  He states that it feels like a Charlie horse.  He also has a knot in his groin.  He has tried deep tissue massage and percussion gun and he can not get rid of the pain.  It is starting to be that just walking to the mailbox causes him  increased pain.    Pertinent History OA, HTN, Rt THR ,GERD,    Limitations Sitting;Walking    How long can you sit comfortably? no problem    How long can you stand comfortably? able to stand for less than five minutes    How long can you walk comfortably? immediately feels it when walking.  He can take a pause and then keep walking    Patient Stated Goals less pain; improved sleep, (pt having difficulty getting to sleep, waking him up 3-4 x a night)    Currently in Pain? --   now 0, worst 4; best 0                 09/23/20 0001  Assessment  Medical Diagnosis Rt ITB syndrom  Referring Provider (PT) Edmonia Lynch  Onset Date/Surgical Date 03/23/20  Next MD Visit 10/29/2020  Precautions  Precautions None  Restrictions  Weight Bearing Restrictions No  Balance Screen  Has the patient fallen in the past 6 months No  Has the patient had a decrease in activity level because of a fear of falling?  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, sew  Cognition  Overall Cognitive Status Within Functional Limits for tasks assessed  Observation/Other Assessments  Focus on Therapeutic Outcomes (FOTO)  53; 47% affected  Functional Tests  Functional tests Single leg stance;Sit to Stand  Single Leg Stance  Comments RT: 60 seconds no pain with standing but pain when he takes the pressue off of his leg  Sit to Stand  Comments 11 in 30 seconds  ROM / Strength  AROM / PROM / Strength Strength  Strength  Strength Assessment Site Hip;Knee;Ankle  Right/Left Hip Right  Right/Left Knee Right  Right Hip Flexion 5/5  Right Hip Extension 3+/5  Right Hip ABduction 4-/5  Right Hip External Rotation  4+/5  Right Knee Flexion 5/5  Right Knee Extension 5/5  Palpation  Palpation comment increased mm spasm, trigger points throughout lateral aspect of Rt LE             Objective measurements completed on examination:  See above findings.         09/23/20 0001  Exercises  Exercises Knee/Hip  Knee/Hip Exercises: Supine  Other Supine Knee/Hip Exercises hip adduction stretch; butterfly  Knee/Hip Exercises: Sidelying  Hip ABduction Right;10 reps  Clams 10  Knee/Hip Exercises: Prone  Hip Extension 10 reps           PT Education - 09/23/20 1606    Education Details HEP    Person(s) Educated Patient    Methods Explanation;Handout    Comprehension Verbalized understanding;Returned demonstration            PT Short Term Goals - 09/23/20 1711      PT SHORT TERM GOAL #1   Title PT to be I in HEP to allow pain to decrease to allow pt to  be waking up only 1 -2 times per night.    Time 3    Period Weeks    Status New    Target Date 10/14/20      PT SHORT TERM GOAL #2   Title Pt mm strength to increase to 1/2 grade to improve pt sit to stand ability as demonstrated by increased sit to stand in a 30 second period of time.    Time 3    Period Weeks    Status New    Target Date 11/04/20             PT Long Term Goals - 09/24/20 0815      PT LONG TERM GOAL #1   Title PT to be I in a HEP to allow pain to be no greater than a 2/10 to allow pt to sleep throughout the night without waking due to Rt LE pain.    Time 6    Period Weeks    Status New    Target Date 11/04/20      PT LONG TERM GOAL #2   Title PT strength in his RT LE to be at least a 5-/5 to allow pt to be able to complete a full day at work without experiencing increased Rt LE pain.    Time 6    Period Weeks    Status New      PT LONG TERM GOAL #3   Title Patient will improve on FOTO score to meet predicted outcomes to demonstrate improved functional mobility.    Time 6    Period Weeks    Status New  Plan - 09/23/20 1619    Clinical Impression Statement Pt is a 55 yo male who had a recent Rt THR on 03/23/2020.  He had 12 visits of therapy and was doing well until February.  He has changed  jobs and now travels a lot.  He was traveling via air and following the trip he noticed that he was having increased pain in the distal lateral aspect of his right leg as well as in his groin.  He has noticed that it is affecting his ability to walk therefore he went back to his Psychologist, sport and exercise.  His MD stated everything was doing well as far as the placement of his prothesis and has referred him to skilled PT.  Evaluation demonstrates decreased strength,decreased activity tolerance, increased mm spasm  and  increased pain.  Mr. Shrewsbury will benefit from skilled PT to address these issues and maximize his functional ability.    Personal Factors and Comorbidities Comorbidity 2;Past/Current Experience    Comorbidities OA, Rt THR,    Examination-Activity Limitations Stand;Lift;Locomotion Level;Sleep;Squat    Examination-Participation Restrictions Occupation;Cleaning;Community Activity    Stability/Clinical Decision Making Evolving/Moderate complexity    Clinical Decision Making Moderate    Rehab Potential Good    PT Frequency 2x / week    PT Duration 6 weeks    PT Treatment/Interventions Patient/family education;Therapeutic activities;Therapeutic exercise;Dry needling;Manual techniques    PT Next Visit Plan Begin rocker board, sit to stand, step up, step down, lunging, hip abductor and flexor stretch as well as manual or dry needling for pain control    PT Home Exercise Plan sidelying abduction, clam, hip adductor stretch and prone hip extension.           Patient will benefit from skilled therapeutic intervention in order to improve the following deficits and impairments:  Pain,Increased muscle spasms,Difficulty walking,Decreased mobility,Decreased activity tolerance  Visit Diagnosis: Difficulty in walking, not elsewhere classified  Pain in right leg     Problem List Patient Active Problem List   Diagnosis Date Noted  . S/P rotator cuff repair 02/10/2014  . Pain in joint, shoulder region  02/10/2014  . Muscle weakness (generalized) 02/10/2014  . Decreased range of motion of right shoulder 02/10/2014   Rayetta Humphrey, PT CLT [phone] 09/24/2020, 8:18 AM  [facility] [address] [place], [place], [address] Phone: [phone]   Fax:  [phone]  Name: KHAI ARRONA MRN: [id number] Date of Birth: 1966/02/17

## 2020-09-24 NOTE — Addendum Note (Signed)
Addended by: Leeroy Cha on: 09/24/2020 08:25 AM   Modules accepted: Orders

## 2020-09-27 ENCOUNTER — Other Ambulatory Visit: Payer: Self-pay

## 2020-09-27 ENCOUNTER — Ambulatory Visit (HOSPITAL_COMMUNITY): Payer: BC Managed Care – PPO | Attending: Orthopedic Surgery | Admitting: Physical Therapy

## 2020-09-27 ENCOUNTER — Encounter (HOSPITAL_COMMUNITY): Payer: Self-pay | Admitting: Physical Therapy

## 2020-09-27 DIAGNOSIS — M79604 Pain in right leg: Secondary | ICD-10-CM

## 2020-09-27 DIAGNOSIS — R2689 Other abnormalities of gait and mobility: Secondary | ICD-10-CM | POA: Diagnosis not present

## 2020-09-27 DIAGNOSIS — R262 Difficulty in walking, not elsewhere classified: Secondary | ICD-10-CM | POA: Insufficient documentation

## 2020-09-28 ENCOUNTER — Encounter (HOSPITAL_COMMUNITY): Payer: BC Managed Care – PPO

## 2020-09-28 NOTE — Therapy (Signed)
[place] [address] [place], [place], [address] Phone: [phone]   Fax:  [phone]  Physical Therapy Treatment  Patient Details  Name: Marcus Chen MRN: [id number] Date of Birth: [birth year] Referring Provider (PT): Edmonia Lynch   Encounter Date: 09/27/2020   PT End of Session - 09/27/20 1126    Visit Number 2    Number of Visits 12    Date for PT Re-Evaluation 11/04/20    Authorization Type BCBS/ UHC    Authorization - Number of Visits 30    PT Start Time 1117    PT Stop Time 1200    PT Time Calculation (min) 43 min    Activity Tolerance Patient tolerated treatment well    Behavior During Therapy [facility] for tasks assessed/performed           Past Medical History:  Diagnosis Date  . AC (acromioclavicular) joint bone spurs    spurs  . Allergy   . Arthritis   . Asthma    season,pet alergies  . Gout   . History of kidney stones   . Kidney stone   . Post-operative nausea and vomiting    nausea and vomitting  . Shoulder pain, bilateral     Past Surgical History:  Procedure Laterality Date  . APPENDECTOMY    . ROTATOR CUFF REPAIR  2014   right/ left shoulder was done 2010  . stomach reduction  2003  . TONSILLECTOMY    . TOTAL HIP ARTHROPLASTY Right 03/23/2020   Procedure: TOTAL HIP ARTHROPLASTY ANTERIOR APPROACH;  Surgeon: Renette Butters, MD;  Location: [facility];  Service: Orthopedics;  Laterality: Right;    There were no vitals filed for this visit.   Subjective Assessment - 09/27/20 1125    Subjective Patient says HEP was "banging". Flared up leg a little. He still feels a Charlie horse in his lateral RT thigh when he unweights that leg.    Pertinent History OA, HTN, Rt THR ,GERD,    Limitations Sitting;Walking    How long can you sit comfortably? no problem    How long can you stand comfortably? able to stand for less than five minutes    How long can you walk comfortably? immediately feels it when  walking.  He can take a pause and then keep walking    Patient Stated Goals less pain; improved sleep, (pt having difficulty getting to sleep, waking him up 3-4 x a night)    Currently in Pain? No/denies                             [facility] Adult PT Treatment/Exercise - 09/28/20 0001      Knee/Hip Exercises: Standing   Gait Training Assessed gait mechnics with test retest throughout session    Other Standing Knee Exercises repeated lumbar extension in standing x10 (no effect), RT side glides in standing x10 (worse)      Manual Therapy   Manual Therapy Soft tissue mobilization    Manual therapy comments completed separate rest of treatment    Soft tissue mobilization STM to RT lateral quad pre and post DN, with trigger point identification and area preparation            Trigger Point Dry Needling - 09/28/20 0001    Consent Given? Yes    Education Handout Provided Previously provided    Muscles Treated Lower Quadrant Vastus lateralis  Dry Needling Comments 1 needle to distal RT quad and 1 needle to RT mid quad, tolerated well, decreased distal thigh pain with ambulation post DN    Vastus lateralis Response Twitch response elicited                  PT Short Term Goals - 09/23/20 1711      PT SHORT TERM GOAL #1   Title PT to be I in HEP to allow pain to decrease to allow pt to  be waking up only 1 -2 times per night.    Time 3    Period Weeks    Status New    Target Date 10/14/20      PT SHORT TERM GOAL #2   Title Pt mm strength to increase to 1/2 grade to improve pt sit to stand ability as demonstrated by increased sit to stand in a 30 second period of time.    Time 3    Period Weeks    Status New    Target Date 11/04/20             PT Long Term Goals - 09/24/20 0815      PT LONG TERM GOAL #1   Title PT to be I in a HEP to allow pain to be no greater than a 2/10 to allow pt to sleep throughout the night without waking due to Rt LE pain.     Time 6    Period Weeks    Status New    Target Date 11/04/20      PT LONG TERM GOAL #2   Title PT strength in his RT LE to be at least a 5-/5 to allow pt to be able to complete a full day at work without experiencing increased Rt LE pain.    Time 6    Period Weeks    Status New      PT LONG TERM GOAL #3   Title Patient will improve on FOTO score to meet predicted outcomes to demonstrate improved functional mobility.    Time 6    Period Weeks    Status New                 Plan - 09/28/20 1023    Clinical Impression Statement Assessed patient gait and posture. Noted RT lateral shift in standing, significant Trendelenburg when ambulating. Discussed these findings with patient. Assessed repeated movement testing with lumbar spine, noting correction for lateral shift makes symptoms worse. Educated patient on dry needling, which he has had at this location in the past for his shoulder. Performed DN to RT lateral quad for trigger points noted at mid, and distal portion of muscle. Patient noting improved distal thigh pain ambulating post DN. Biomechanics remain relatively unchanged. Educated patient on self awareness to avoid excess RT weight shift when walking. May trial heel raise in near future pending results of DN and hip strengthening activity.    Personal Factors and Comorbidities Comorbidity 2;Past/Current Experience    Comorbidities OA, Rt THR,    Examination-Activity Limitations Stand;Lift;Locomotion Level;Sleep;Squat    Examination-Participation Restrictions Occupation;Cleaning;Community Activity    Stability/Clinical Decision Making Evolving/Moderate complexity    Rehab Potential Good    PT Frequency 2x / week    PT Duration 6 weeks    PT Treatment/Interventions Patient/family education;Therapeutic activities;Therapeutic exercise;Dry needling;Manual techniques    PT Next Visit Plan Continue DN, hip strengthening, gait mechanics. Trial heel lift in 2 weeks if no better.  Incorporate core strengthening as well.    PT Home Exercise Plan sidelying abduction, clam, hip adductor stretch and prone hip extension.           Patient will benefit from skilled therapeutic intervention in order to improve the following deficits and impairments:  Pain,Increased muscle spasms,Difficulty walking,Decreased mobility,Decreased activity tolerance  Visit Diagnosis: Difficulty in walking, not elsewhere classified  Pain in right leg     Problem List Patient Active Problem List   Diagnosis Date Noted  . S/P rotator cuff repair 02/10/2014  . Pain in joint, shoulder region 02/10/2014  . Muscle weakness (generalized) 02/10/2014  . Decreased range of motion of right shoulder 02/10/2014    10:27 AM, 09/28/20 Josue Hector PT DPT  Physical Therapist with [facility]  [phone] [place] [address] [place], [place], [address] Phone: [phone]   Fax:  [phone]  Name: BERTRAM HADDIX MRN: [id number] Date of Birth: 02/10/1966

## 2020-09-30 ENCOUNTER — Other Ambulatory Visit: Payer: Self-pay

## 2020-09-30 ENCOUNTER — Encounter (HOSPITAL_COMMUNITY): Payer: Self-pay | Admitting: Physical Therapy

## 2020-09-30 ENCOUNTER — Ambulatory Visit (HOSPITAL_COMMUNITY): Payer: BC Managed Care – PPO | Admitting: Physical Therapy

## 2020-09-30 DIAGNOSIS — M79604 Pain in right leg: Secondary | ICD-10-CM | POA: Diagnosis not present

## 2020-09-30 DIAGNOSIS — R2689 Other abnormalities of gait and mobility: Secondary | ICD-10-CM | POA: Diagnosis not present

## 2020-09-30 DIAGNOSIS — R262 Difficulty in walking, not elsewhere classified: Secondary | ICD-10-CM

## 2020-09-30 NOTE — Therapy (Signed)
[place] [address] [place], [place], [address] Phone: [phone]   Fax:  [phone]  Physical Therapy Treatment  Patient Details  Name: Marcus Chen MRN: [id number] Date of Birth: [birth year] Referring Provider (PT): Edmonia Lynch   Encounter Date: 09/30/2020   PT End of Session - 09/30/20 1535    Visit Number 3    Number of Visits 12    Date for PT Re-Evaluation 11/04/20    Authorization Type BCBS/ UHC    Authorization - Visit Number 3    Authorization - Number of Visits 30    PT Start Time 2229   pt late to session   PT Stop Time 1615    PT Time Calculation (min) 40 min    Activity Tolerance Patient tolerated treatment well    Behavior During Therapy [facility] for tasks assessed/performed           Past Medical History:  Diagnosis Date  . AC (acromioclavicular) joint bone spurs    spurs  . Allergy   . Arthritis   . Asthma    season,pet alergies  . Gout   . History of kidney stones   . Kidney stone   . Post-operative nausea and vomiting    nausea and vomitting  . Shoulder pain, bilateral     Past Surgical History:  Procedure Laterality Date  . APPENDECTOMY    . ROTATOR CUFF REPAIR  2014   right/ left shoulder was done 2010  . stomach reduction  2003  . TONSILLECTOMY    . TOTAL HIP ARTHROPLASTY Right 03/23/2020   Procedure: TOTAL HIP ARTHROPLASTY ANTERIOR APPROACH;  Surgeon: Renette Butters, MD;  Location: [facility];  Service: Orthopedics;  Laterality: Right;    There were no vitals filed for this visit.   Subjective Assessment - 09/30/20 1538    Subjective States that he felt a little better after last session. States that that he still has the Charlie horse cramp along right lateral side states it feels higher. Current pain level is sharp 6/10.    Pertinent History OA, HTN, Rt THR ,GERD,    Limitations Sitting;Walking    How long can you sit comfortably? no problem    How long can you stand comfortably?  able to stand for less than five minutes    How long can you walk comfortably? immediately feels it when walking.  He can take a pause and then keep walking    Patient Stated Goals less pain; improved sleep, (pt having difficulty getting to sleep, waking him up 3-4 x a night)    Currently in Pain? Yes    Pain Score 6               [facility] PT Assessment - 09/30/20 0001      Assessment   Medical Diagnosis Rt ITB syndrom    Referring Provider (PT) Edmonia Lynch                         [facility] Adult PT Treatment/Exercise - 09/30/20 0001      Knee/Hip Exercises: Stretches   Other Knee/Hip Stretches DKC with ball x10 10" holds      Knee/Hip Exercises: Supine   Other Supine Knee/Hip Exercises PROm of right hip - tolerated fine no change in symptoms    Other Supine Knee/Hip Exercises hamstring isometric on ball - toes out 3x10 5" holds R      Knee/Hip Exercises:  Prone   Other Prone Exercises repeated lumbar  extensions x15 5" hold s      Manual Therapy   Manual Therapy Soft tissue mobilization;Joint mobilization;Manual Traction    Manual therapy comments completed separate rest of treatment    Joint Mobilization CPA and UPA to lumbar spine grade II/III - no change in symptoms    Soft tissue mobilization STM to right psoas, lumabr paraspinals, QL.    Manual Traction gentle right hip traction - no change in symptoms                  PT Education - 09/30/20 1622    Education Details on current presentation, rationale for exercises. on elbow stretches with different wrist position    Person(s) Educated Patient    Methods Explanation    Comprehension Verbalized understanding            PT Short Term Goals - 09/23/20 1711      PT SHORT TERM GOAL #1   Title PT to be I in HEP to allow pain to decrease to allow pt to  be waking up only 1 -2 times per night.    Time 3    Period Weeks    Status New    Target Date 10/14/20      PT SHORT TERM GOAL #2   Title  Pt mm strength to increase to 1/2 grade to improve pt sit to stand ability as demonstrated by increased sit to stand in a 30 second period of time.    Time 3    Period Weeks    Status New    Target Date 11/04/20             PT Long Term Goals - 09/24/20 0815      PT LONG TERM GOAL #1   Title PT to be I in a HEP to allow pain to be no greater than a 2/10 to allow pt to sleep throughout the night without waking due to Rt LE pain.    Time 6    Period Weeks    Status New    Target Date 11/04/20      PT LONG TERM GOAL #2   Title PT strength in his RT LE to be at least a 5-/5 to allow pt to be able to complete a full day at work without experiencing increased Rt LE pain.    Time 6    Period Weeks    Status New      PT LONG TERM GOAL #3   Title Patient will improve on FOTO score to meet predicted outcomes to demonstrate improved functional mobility.    Time 6    Period Weeks    Status New                 Plan - 09/30/20 1623    Clinical Impression Statement Assessed lumbar spine with hypomobility noted throughout lumbar spine but with no referred symptoms. Psoas inaccessible secondary to anterior adipose tissue but tenderness noted throughout right iliacus muscle. Transitioned to hamstring isometric in position mirroring taking a step. Initially patient activated quads with this exercise, used verbal and tactile cues and quad activation reduced. Patient had to start with <50% max isometric to achieve this but able to increase with repetition. Improved symptoms and palpable decrease in resting muscle tone in this area. Will continue with reflexive inhibition by activating opposite musculature. Will continue to assess lumbar spine as this is likely contributing  to current symptoms.    Personal Factors and Comorbidities Comorbidity 2;Past/Current Experience    Comorbidities OA, Rt THR,    Examination-Activity Limitations Stand;Lift;Locomotion Level;Sleep;Squat     Examination-Participation Restrictions Occupation;Cleaning;Community Activity    Stability/Clinical Decision Making Evolving/Moderate complexity    Rehab Potential Good    PT Frequency 2x / week    PT Duration 6 weeks    PT Treatment/Interventions Patient/family education;Therapeutic activities;Therapeutic exercise;Dry needling;Manual techniques    PT Next Visit Plan hamstring activation to reduce quad activation, continue with lumbar assessment. Continue DN, hip strengthening, gait mechanics. Trial heel lift in 2 weeks if no better. Incorporate core strengthening as well.    PT Home Exercise Plan sidelying abduction, clam, hip adductor stretch and prone hip extension.           Patient will benefit from skilled therapeutic intervention in order to improve the following deficits and impairments:  Pain,Increased muscle spasms,Difficulty walking,Decreased mobility,Decreased activity tolerance  Visit Diagnosis: Difficulty in walking, not elsewhere classified  Pain in right leg  Other abnormalities of gait and mobility     Problem List Patient Active Problem List   Diagnosis Date Noted  . S/P rotator cuff repair 02/10/2014  . Pain in joint, shoulder region 02/10/2014  . Muscle weakness (generalized) 02/10/2014  . Decreased range of motion of right shoulder 02/10/2014   4:24 PM, 09/30/20 Jerene Pitch, DPT Physical Therapy with [facility]  [phone] office  [place] [address] [place], [place], [address] Phone: [phone]   Fax:  [phone]  Name: Marcus Chen MRN: [id number] Date of Birth: Mar 19, 1966

## 2020-10-04 ENCOUNTER — Encounter (HOSPITAL_COMMUNITY): Payer: BC Managed Care – PPO

## 2020-10-05 ENCOUNTER — Ambulatory Visit (HOSPITAL_COMMUNITY): Payer: BC Managed Care – PPO

## 2020-10-05 ENCOUNTER — Telehealth (HOSPITAL_COMMUNITY): Payer: Self-pay | Admitting: Physical Therapy

## 2020-10-05 ENCOUNTER — Ambulatory Visit (HOSPITAL_COMMUNITY): Payer: BC Managed Care – PPO | Admitting: Physical Therapy

## 2020-10-05 ENCOUNTER — Other Ambulatory Visit: Payer: Self-pay

## 2020-10-05 DIAGNOSIS — M79604 Pain in right leg: Secondary | ICD-10-CM

## 2020-10-05 DIAGNOSIS — R2689 Other abnormalities of gait and mobility: Secondary | ICD-10-CM

## 2020-10-05 DIAGNOSIS — R262 Difficulty in walking, not elsewhere classified: Secondary | ICD-10-CM | POA: Diagnosis not present

## 2020-10-05 NOTE — Therapy (Signed)
[place] [address] [place], [place], [address] Phone: [phone]   Fax:  [phone]  Physical Therapy Treatment  Patient Details  Name: Marcus Chen MRN: [id number] Date of Birth: 1966/03/24 Referring Provider (PT): Edmonia Lynch   Encounter Date: 10/05/2020   PT End of Session - 10/05/20 1645    Visit Number 4    Number of Visits 12    Date for PT Re-Evaluation 11/04/20    Authorization Type BCBS/ UHC    Authorization - Visit Number 4    Authorization - Number of Visits 30    PT Start Time 9678    PT Stop Time 1730    PT Time Calculation (min) 45 min    Activity Tolerance Patient tolerated treatment well    Behavior During Therapy [facility] for tasks assessed/performed           Past Medical History:  Diagnosis Date  . AC (acromioclavicular) joint bone spurs    spurs  . Allergy   . Arthritis   . Asthma    season,pet alergies  . Gout   . History of kidney stones   . Kidney stone   . Post-operative nausea and vomiting    nausea and vomitting  . Shoulder pain, bilateral     Past Surgical History:  Procedure Laterality Date  . APPENDECTOMY    . ROTATOR CUFF REPAIR  2014   right/ left shoulder was done 2010  . stomach reduction  2003  . TONSILLECTOMY    . TOTAL HIP ARTHROPLASTY Right 03/23/2020   Procedure: TOTAL HIP ARTHROPLASTY ANTERIOR APPROACH;  Surgeon: Renette Butters, MD;  Location: [facility];  Service: Orthopedics;  Laterality: Right;    There were no vitals filed for this visit.   Subjective Assessment - 10/05/20 1656    Subjective Notes continued right lateral leg pain and feeling of a cramp in his right groin at times. Pt tried use of heel lift in right shoe which did not alleviate symptoms.    Pertinent History OA, HTN, Rt THR ,GERD,    Limitations Sitting;Walking    How long can you sit comfortably? no problem    How long can you stand comfortably? able to stand for less than five minutes    How  long can you walk comfortably? immediately feels it when walking.  He can take a pause and then keep walking    Patient Stated Goals less pain; improved sleep, (pt having difficulty getting to sleep, waking him up 3-4 x a night)    Currently in Pain? Yes    Pain Score 6     Pain Location Hip    Pain Orientation Right    Pain Descriptors / Indicators Aching              [facility] PT Assessment - 10/05/20 0001      Assessment   Medical Diagnosis Rt ITB syndrom    Referring Provider (PT) Edmonia Lynch                         [facility] Adult PT Treatment/Exercise - 10/05/20 0001      Ambulation/Gait   Ambulation/Gait Yes    Ambulation/Gait Assistance 7: Independent    Gait Comments Demonstrates pronounced right lateral lean in stance phase with COG shifted right in stance and genu valgum in compensation.  Gait trials with cane and instruction in sequence      Knee/Hip Exercises:  Stretches   Sports administrator Both;3 reps;60 seconds    ITB Stretch Right;3 reps;60 seconds   Ober and modified Ober position     Knee/Hip Exercises: Standing   Other Standing Knee Exercises sidestepping with blue t-loop x 2 min      Knee/Hip Exercises: Sidelying   Hip ABduction Right;2 sets;15 reps   to failure                 PT Education - 10/05/20 1740    Education Details education on HEP additions and training in positioning for gluteal isolation for PRE.  training in use of cane for offloading of RLE    Person(s) Educated Patient    Methods Explanation;Demonstration    Comprehension Verbalized understanding;Returned demonstration            PT Short Term Goals - 09/23/20 1711      PT SHORT TERM GOAL #1   Title PT to be I in HEP to allow pain to decrease to allow pt to  be waking up only 1 -2 times per night.    Time 3    Period Weeks    Status New    Target Date 10/14/20      PT SHORT TERM GOAL #2   Title Pt mm strength to increase to 1/2 grade to improve pt sit to  stand ability as demonstrated by increased sit to stand in a 30 second period of time.    Time 3    Period Weeks    Status New    Target Date 11/04/20             PT Long Term Goals - 09/24/20 0815      PT LONG TERM GOAL #1   Title PT to be I in a HEP to allow pain to be no greater than a 2/10 to allow pt to sleep throughout the night without waking due to Rt LE pain.    Time 6    Period Weeks    Status New    Target Date 11/04/20      PT LONG TERM GOAL #2   Title PT strength in his RT LE to be at least a 5-/5 to allow pt to be able to complete a full day at work without experiencing increased Rt LE pain.    Time 6    Period Weeks    Status New      PT LONG TERM GOAL #3   Title Patient will improve on FOTO score to meet predicted outcomes to demonstrate improved functional mobility.    Time 6    Period Weeks    Status New                 Plan - 10/05/20 1741    Clinical Impression Statement Demonstrates pronounced right lateral lean in stance phase due to pain/RLE dysfunction and notes increase in pain/fatigue with hip abduction isolation and repetition to failure.  Gait deviations greatly reduced with use of cane and cues in proper sequence.  Demonstrates distance of two-fists with modified Ober position and notes pronounced stretch in this position.  Continued POC indicated to improve right hip strength and normalize gait pattern    Personal Factors and Comorbidities Comorbidity 2;Past/Current Experience    Comorbidities OA, Rt THR,    Examination-Activity Limitations Stand;Lift;Locomotion Level;Sleep;Squat    Examination-Participation Restrictions Occupation;Cleaning;Community Activity    Stability/Clinical Decision Making Evolving/Moderate complexity    Rehab Potential Good    PT  Frequency 2x / week    PT Duration 6 weeks    PT Treatment/Interventions Patient/family education;Therapeutic activities;Therapeutic exercise;Dry needling;Manual techniques    PT Next  Visit Plan hamstring activation to reduce quad activation, continue with lumbar assessment. Continue DN, hip strengthening, gait mechanics. Trial heel lift in 2 weeks if no better. Incorporate core strengthening as well.    PT Home Exercise Plan sidelying abduction, clam, hip adductor stretch and prone hip extension.           Patient will benefit from skilled therapeutic intervention in order to improve the following deficits and impairments:  Pain,Increased muscle spasms,Difficulty walking,Decreased mobility,Decreased activity tolerance  Visit Diagnosis: Difficulty in walking, not elsewhere classified  Pain in right leg  Other abnormalities of gait and mobility     Problem List Patient Active Problem List   Diagnosis Date Noted  . S/P rotator cuff repair 02/10/2014  . Pain in joint, shoulder region 02/10/2014  . Muscle weakness (generalized) 02/10/2014  . Decreased range of motion of right shoulder 02/10/2014   5:46 PM, 10/05/20 M. Sherlyn Lees, PT, DPT Physical Therapist- [place] Office Number: [phone]  [place] [address] [place], [place], [address] Phone: [phone]   Fax:  [phone]  Name: Marcus Chen MRN: [id number] Date of Birth: Jun 27, 1965

## 2020-10-05 NOTE — Telephone Encounter (Signed)
No Show. Called and left VM about missed apt and about next appointment.  4:05 PM, 10/05/20 Jerene Pitch, DPT Physical Therapy with [facility]  [phone] office

## 2020-10-06 ENCOUNTER — Telehealth (HOSPITAL_COMMUNITY): Payer: Self-pay

## 2020-10-06 ENCOUNTER — Ambulatory Visit (HOSPITAL_COMMUNITY): Payer: BC Managed Care – PPO

## 2020-10-06 NOTE — Telephone Encounter (Signed)
No show, called and left message concerning missed apt today.  Reminded next apt date and time wiht contact information included.  Included no show policy details in message.    Ihor Austin, LPTA/CLT; Delana Meyer [phone]

## 2020-10-12 ENCOUNTER — Ambulatory Visit (HOSPITAL_COMMUNITY): Payer: BC Managed Care – PPO | Admitting: Physical Therapy

## 2020-10-14 ENCOUNTER — Ambulatory Visit (HOSPITAL_COMMUNITY): Payer: BC Managed Care – PPO | Admitting: Physical Therapy

## 2020-10-14 ENCOUNTER — Other Ambulatory Visit: Payer: Self-pay

## 2020-10-14 ENCOUNTER — Encounter (HOSPITAL_COMMUNITY): Payer: Self-pay | Admitting: Physical Therapy

## 2020-10-14 DIAGNOSIS — R262 Difficulty in walking, not elsewhere classified: Secondary | ICD-10-CM | POA: Diagnosis not present

## 2020-10-14 DIAGNOSIS — R2689 Other abnormalities of gait and mobility: Secondary | ICD-10-CM | POA: Diagnosis not present

## 2020-10-14 DIAGNOSIS — M79604 Pain in right leg: Secondary | ICD-10-CM | POA: Diagnosis not present

## 2020-10-14 NOTE — Therapy (Signed)
[facility] [address] [place], [place], [address] Phone: [phone]   Fax:  [phone]  Physical Therapy Treatment  Patient Details  Name: Marcus Chen MRN: [id number] Date of Birth: [birth year] Referring Provider (PT): Edmonia Lynch   Encounter Date: 10/14/2020   PT End of Session - 10/14/20 1532    Visit Number 5    Number of Visits 12    Date for PT Re-Evaluation 11/04/20    Authorization Type [employer]/ [employer]    Authorization - Visit Number 5    Authorization - Number of Visits 30    PT Start Time 1532    PT Stop Time 1610    PT Time Calculation (min) 38 min    Activity Tolerance Patient tolerated treatment well    Behavior During Therapy [facility] for tasks assessed/performed           Past Medical History:  Diagnosis Date  . AC (acromioclavicular) joint bone spurs    spurs  . Allergy   . Arthritis   . Asthma    season,pet alergies  . Gout   . History of kidney stones   . Kidney stone   . Post-operative nausea and vomiting    nausea and vomitting  . Shoulder pain, bilateral     Past Surgical History:  Procedure Laterality Date  . APPENDECTOMY    . ROTATOR CUFF REPAIR  2014   right/ left shoulder was done 2010  . stomach reduction  2003  . TONSILLECTOMY    . TOTAL HIP ARTHROPLASTY Right 03/23/2020   Procedure: TOTAL HIP ARTHROPLASTY ANTERIOR APPROACH;  Surgeon: Renette Butters, MD;  Location: [facility];  Service: Orthopedics;  Laterality: Right;    There were no vitals filed for this visit.   Subjective Assessment - 10/14/20 1542    Subjective States he is getting better and the distal part of his thigh is much better. States that the proximal part of his thigh is also better. States now he is noticing it a little bit in the groin and front hip flexor just inferior to the incision site. States he can know do a piriformis stretch in sitting. States that he didn't do the cane as he has been very busy. States that he is  constantly doing activities and projects. Reports greatest limitation is tightness after sitting.    Pertinent History OA, HTN, Rt THR ,GERD,    Limitations Sitting;Walking    How long can you sit comfortably? no problem    How long can you stand comfortably? able to stand for less than five minutes    How long can you walk comfortably? immediately feels it when walking.  He can take a pause and then keep walking    Patient Stated Goals less pain; improved sleep, (pt having difficulty getting to sleep, waking him up 3-4 x a night)    Currently in Pain? Yes    Pain Score 3     Pain Location Leg    Pain Orientation Right    Pain Descriptors / Indicators Aching;Tightness              [facility] PT Assessment - 10/14/20 0001      Assessment   Medical Diagnosis Rt ITB syndrom    Referring Provider (PT) Edmonia Lynch                         [facility] Adult PT Treatment/Exercise - 10/14/20 0001  Knee/Hip Exercises: Standing   Other Standing Knee Exercises seated piriformis stretch movement with isolated end range holds x20 5" holds Bilateral with TRA activation    Other Standing Knee Exercises hip hinge with dowel and verbal/tactile  cues 3x10      Knee/Hip Exercises: Seated   Other Seated Knee/Hip Exercises hip IR and ER with isometric holds at end range with TRA activation and focus on isolating movements no flexion or abd/add) R side x30 5" holds each ,                  PT Education - 10/14/20 1611    Education Details pushing floor away with sit to stand (activate glutes)    Person(s) Educated Patient    Methods Explanation    Comprehension Verbalized understanding            PT Short Term Goals - 09/23/20 1711      PT SHORT TERM GOAL #1   Title PT to be I in HEP to allow pain to decrease to allow pt to  be waking up only 1 -2 times per night.    Time 3    Period Weeks    Status New    Target Date 10/14/20      PT SHORT TERM GOAL #2   Title Pt  mm strength to increase to 1/2 grade to improve pt sit to stand ability as demonstrated by increased sit to stand in a 30 second period of time.    Time 3    Period Weeks    Status New    Target Date 11/04/20             PT Long Term Goals - 09/24/20 0815      PT LONG TERM GOAL #1   Title PT to be I in a HEP to allow pain to be no greater than a 2/10 to allow pt to sleep throughout the night without waking due to Rt LE pain.    Time 6    Period Weeks    Status New    Target Date 11/04/20      PT LONG TERM GOAL #2   Title PT strength in his RT LE to be at least a 5-/5 to allow pt to be able to complete a full day at work without experiencing increased Rt LE pain.    Time 6    Period Weeks    Status New      PT LONG TERM GOAL #3   Title Patient will improve on FOTO score to meet predicted outcomes to demonstrate improved functional mobility.    Time 6    Period Weeks    Status New                 Plan - 10/14/20 1532    Clinical Impression Statement Improved stride length and walking noted today though limp still present. Added end range strengthening and this was tolerated well. Improved gait mechanics and reduced symptoms in hip noted after hip hinge exercises. Fatigue noted end of session. Will continue to work end range strengthening and posterior muscle activation as tolerated.    Personal Factors and Comorbidities Comorbidity 2;Past/Current Experience    Comorbidities OA, Rt THR,    Examination-Activity Limitations Stand;Lift;Locomotion Level;Sleep;Squat    Examination-Participation Restrictions Occupation;Cleaning;Community Activity    Stability/Clinical Decision Making Evolving/Moderate complexity    Rehab Potential Good    PT Frequency 2x / week    PT Duration  6 weeks    PT Treatment/Interventions Patient/family education;Therapeutic activities;Therapeutic exercise;Dry needling;Manual techniques    PT Next Visit Plan continue with end range range  strengethening of hip IR/ER and isolate hip motions. f/u with hip hinge    PT Home Exercise Plan sidelying abduction, clam, hip adductor stretch and prone hip extension.; 5/19 hip hinge, hip IR/ER and pirirformis ROM end range hold           Patient will benefit from skilled therapeutic intervention in order to improve the following deficits and impairments:  Pain,Increased muscle spasms,Difficulty walking,Decreased mobility,Decreased activity tolerance  Visit Diagnosis: Difficulty in walking, not elsewhere classified  Pain in right leg  Other abnormalities of gait and mobility     Problem List Patient Active Problem List   Diagnosis Date Noted  . S/P rotator cuff repair 02/10/2014  . Pain in joint, shoulder region 02/10/2014  . Muscle weakness (generalized) 02/10/2014  . Decreased range of motion of right shoulder 02/10/2014    4:12 PM, 10/14/20 Jerene Pitch, DPT Physical Therapy with [facility]  [phone] office  [facility] [address] [place], [place], [address] Phone: [phone]   Fax:  [phone]  Name: Marcus Chen MRN: [id number] Date of Birth: [birth year]

## 2020-10-19 ENCOUNTER — Telehealth (HOSPITAL_COMMUNITY): Payer: Self-pay | Admitting: Physical Therapy

## 2020-10-19 ENCOUNTER — Ambulatory Visit (HOSPITAL_COMMUNITY): Payer: BC Managed Care – PPO | Admitting: Physical Therapy

## 2020-10-19 NOTE — Telephone Encounter (Signed)
He has to attend meetings today that he can not change - sorry for late noitce it's work related. He will be here on Thursday

## 2020-10-21 ENCOUNTER — Ambulatory Visit (HOSPITAL_COMMUNITY): Payer: BC Managed Care – PPO | Admitting: Physical Therapy

## 2020-10-26 ENCOUNTER — Ambulatory Visit (HOSPITAL_COMMUNITY): Payer: BC Managed Care – PPO | Admitting: Physical Therapy

## 2020-10-28 ENCOUNTER — Encounter (HOSPITAL_COMMUNITY): Payer: BC Managed Care – PPO | Admitting: Physical Therapy

## 2020-11-01 DIAGNOSIS — J069 Acute upper respiratory infection, unspecified: Secondary | ICD-10-CM | POA: Diagnosis not present

## 2020-11-01 DIAGNOSIS — J45909 Unspecified asthma, uncomplicated: Secondary | ICD-10-CM | POA: Diagnosis not present

## 2020-11-02 ENCOUNTER — Ambulatory Visit (HOSPITAL_COMMUNITY): Payer: BC Managed Care – PPO | Attending: Orthopedic Surgery | Admitting: Physical Therapy

## 2020-11-03 ENCOUNTER — Ambulatory Visit (HOSPITAL_COMMUNITY): Payer: BC Managed Care – PPO | Admitting: Physical Therapy

## 2021-03-15 DIAGNOSIS — Z13220 Encounter for screening for lipoid disorders: Secondary | ICD-10-CM | POA: Diagnosis not present

## 2021-03-15 DIAGNOSIS — R5383 Other fatigue: Secondary | ICD-10-CM | POA: Diagnosis not present

## 2021-03-15 DIAGNOSIS — Z6841 Body Mass Index (BMI) 40.0 and over, adult: Secondary | ICD-10-CM | POA: Diagnosis not present

## 2021-03-15 DIAGNOSIS — R7309 Other abnormal glucose: Secondary | ICD-10-CM | POA: Diagnosis not present

## 2021-03-15 DIAGNOSIS — I10 Essential (primary) hypertension: Secondary | ICD-10-CM | POA: Diagnosis not present

## 2021-03-15 DIAGNOSIS — Z1331 Encounter for screening for depression: Secondary | ICD-10-CM | POA: Diagnosis not present

## 2021-03-15 DIAGNOSIS — Z0000 Encounter for general adult medical examination without abnormal findings: Secondary | ICD-10-CM | POA: Diagnosis not present

## 2021-03-15 DIAGNOSIS — J45909 Unspecified asthma, uncomplicated: Secondary | ICD-10-CM | POA: Diagnosis not present

## 2021-05-18 ENCOUNTER — Encounter (HOSPITAL_COMMUNITY): Payer: Self-pay | Admitting: Physical Therapy

## 2021-05-18 NOTE — Therapy (Signed)
[facility] [place], [place], [address] Phone: [phone]   Fax:  [phone]  Patient Details  Name: Marcus Chen MRN: [id number] Date of Birth: 1965-12-24 Referring Provider:  No ref. provider found  Encounter Date: 05/18/2021  PHYSICAL THERAPY DISCHARGE SUMMARY  Visits from Start of Care: 5  Current functional level related to goals / functional outcomes: PT sated that he was much better on his last visit.    Remaining deficits: Leg pain    Education / Equipment: HEP   Patient agrees to discharge. Patient goals were partially met. Patient is being discharged due to not returning since the last visit.   Rayetta Humphrey, PT CLT [phone]  05/18/2021, 9:43 AM  [facility] [address] [place], [place], [address] Phone: [phone]   Fax:  [phone]

## 2021-05-30 IMAGING — RF DG HIP (WITH PELVIS) OPERATIVE*R*
1 series · 2 of 2 positions shown · non-contrast
Comparison: None.

CLINICAL DATA: Right total hip replacement

EXAM:
OPERATIVE RIGHT HIP  1 VIEW
TECHNIQUE: Fluoroscopic spot image(s) were submitted for interpretation
post-operatively.

[Series 1: unknown protocol · 0.20mm/px · 2 of 2 slices shown]
[im 1/2]
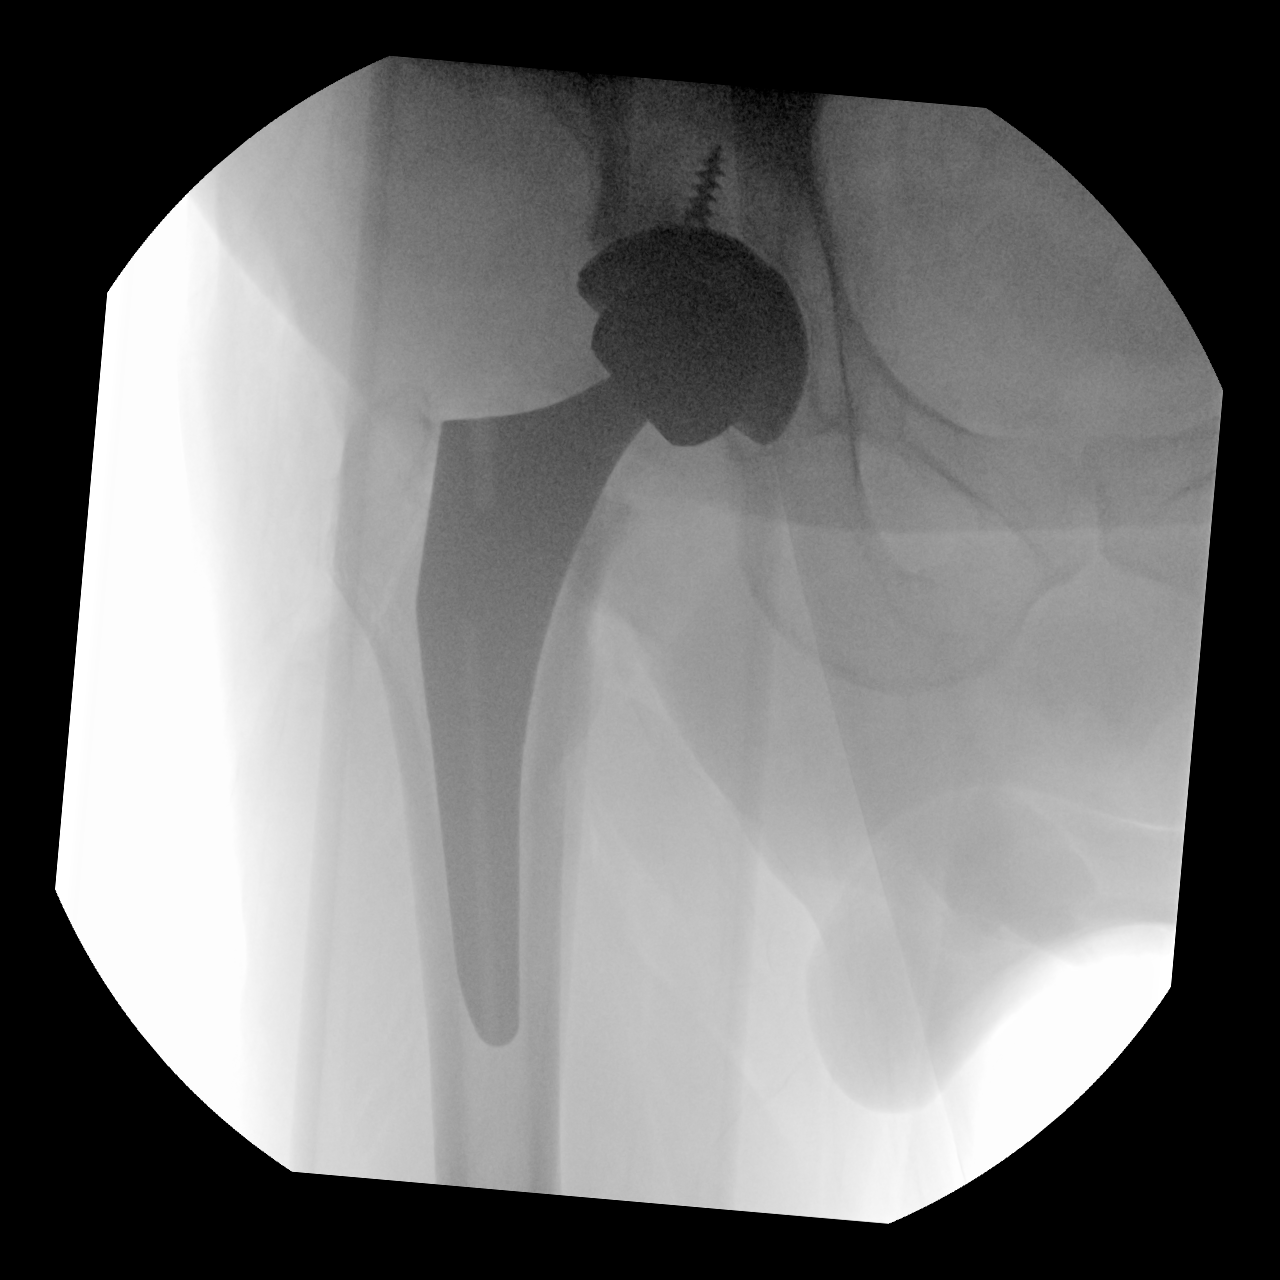
[im 2/2]
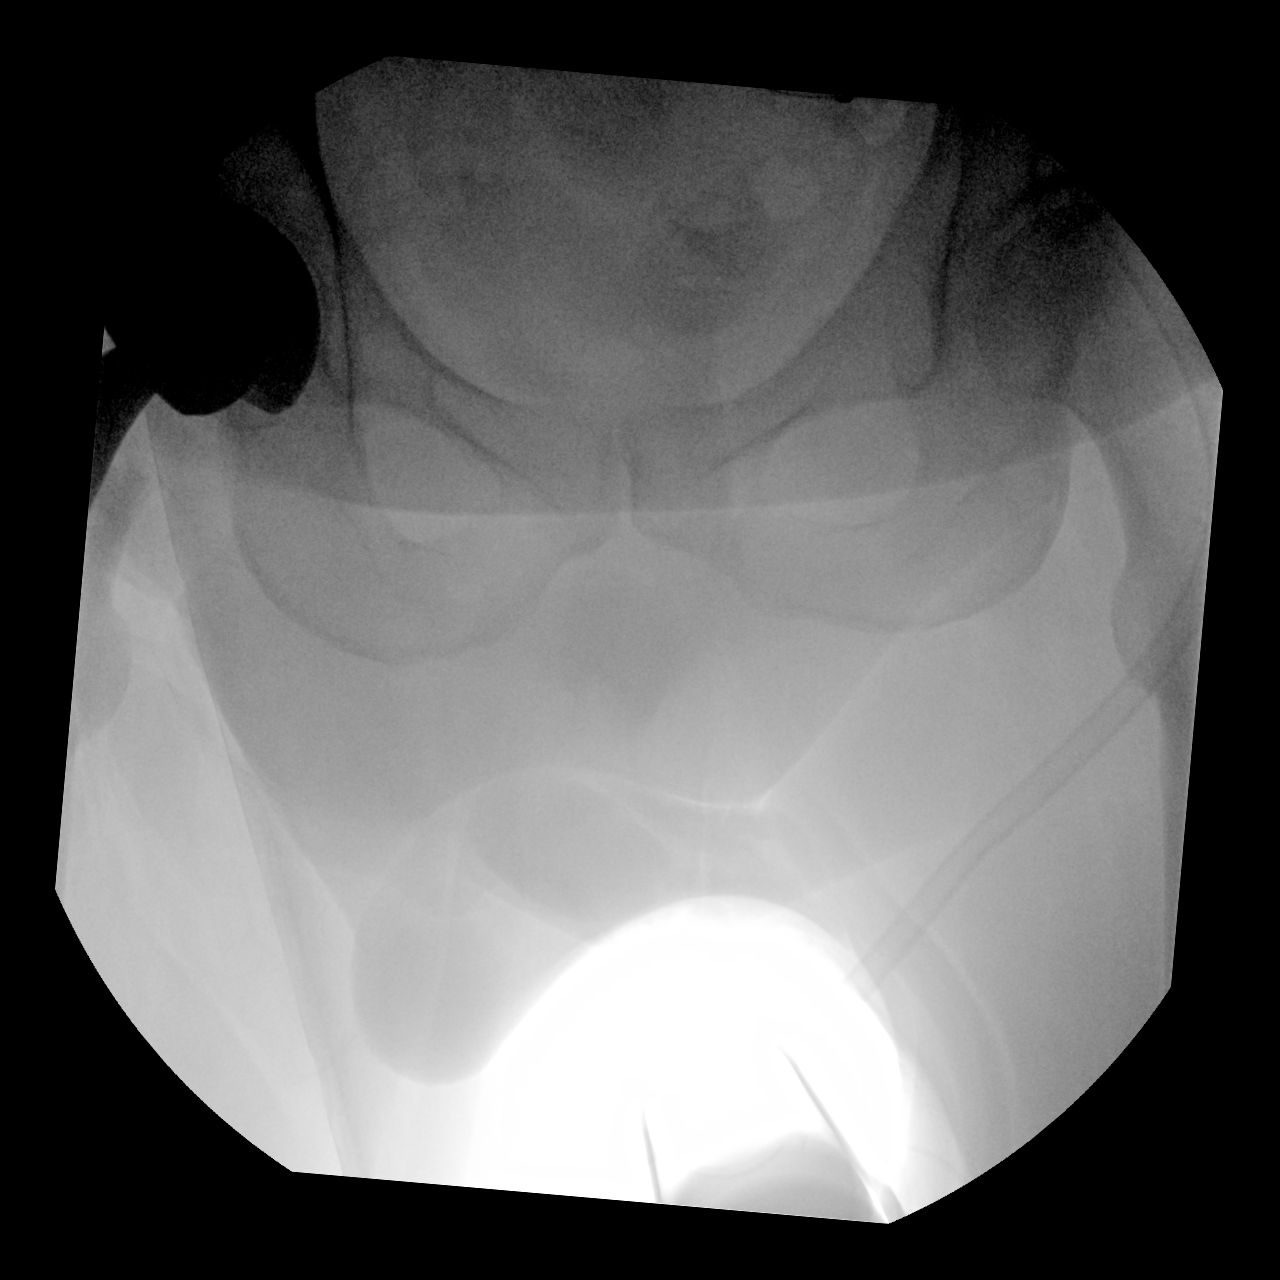

[2 of 2 positions shown; findings below may reference images not displayed]

FINDINGS: Frontal view right hip joint as well as frontal view showing
portions of each hip joint noted. There is a total hip replacement
on the right with prosthetic components well-seated on frontal view.
No evident fracture or dislocation. Visualized left hip
unremarkable.
IMPRESSION: Total hip replacement right with prosthetic components well-seated
on frontal view. No fracture or dislocation evident.

## 2021-06-28 DIAGNOSIS — Z6841 Body Mass Index (BMI) 40.0 and over, adult: Secondary | ICD-10-CM | POA: Diagnosis not present

## 2021-06-28 DIAGNOSIS — F988 Other specified behavioral and emotional disorders with onset usually occurring in childhood and adolescence: Secondary | ICD-10-CM | POA: Diagnosis not present

## 2021-06-28 DIAGNOSIS — Z1331 Encounter for screening for depression: Secondary | ICD-10-CM | POA: Diagnosis not present

## 2021-06-28 DIAGNOSIS — R053 Chronic cough: Secondary | ICD-10-CM | POA: Diagnosis not present

## 2021-12-12 DIAGNOSIS — Z0001 Encounter for general adult medical examination with abnormal findings: Secondary | ICD-10-CM | POA: Diagnosis not present

## 2021-12-12 DIAGNOSIS — I10 Essential (primary) hypertension: Secondary | ICD-10-CM | POA: Diagnosis not present

## 2022-01-04 ENCOUNTER — Encounter (INDEPENDENT_AMBULATORY_CARE_PROVIDER_SITE_OTHER): Payer: Self-pay

## 2022-01-12 DIAGNOSIS — F5101 Primary insomnia: Secondary | ICD-10-CM | POA: Diagnosis not present

## 2022-01-12 DIAGNOSIS — F988 Other specified behavioral and emotional disorders with onset usually occurring in childhood and adolescence: Secondary | ICD-10-CM | POA: Diagnosis not present

## 2022-01-12 DIAGNOSIS — G2581 Restless legs syndrome: Secondary | ICD-10-CM | POA: Diagnosis not present

## 2022-01-12 DIAGNOSIS — M542 Cervicalgia: Secondary | ICD-10-CM | POA: Diagnosis not present

## 2022-01-31 DIAGNOSIS — R634 Abnormal weight loss: Secondary | ICD-10-CM | POA: Diagnosis not present

## 2022-01-31 DIAGNOSIS — R413 Other amnesia: Secondary | ICD-10-CM | POA: Diagnosis not present

## 2022-01-31 DIAGNOSIS — R202 Paresthesia of skin: Secondary | ICD-10-CM | POA: Diagnosis not present

## 2022-02-02 DIAGNOSIS — F988 Other specified behavioral and emotional disorders with onset usually occurring in childhood and adolescence: Secondary | ICD-10-CM | POA: Diagnosis not present

## 2022-02-02 DIAGNOSIS — Z79899 Other long term (current) drug therapy: Secondary | ICD-10-CM | POA: Diagnosis not present

## 2022-02-02 DIAGNOSIS — G2581 Restless legs syndrome: Secondary | ICD-10-CM | POA: Diagnosis not present

## 2022-02-02 DIAGNOSIS — G3184 Mild cognitive impairment, so stated: Secondary | ICD-10-CM | POA: Diagnosis not present

## 2022-02-22 DIAGNOSIS — M9903 Segmental and somatic dysfunction of lumbar region: Secondary | ICD-10-CM | POA: Diagnosis not present

## 2022-02-22 DIAGNOSIS — M25551 Pain in right hip: Secondary | ICD-10-CM | POA: Diagnosis not present

## 2022-02-22 DIAGNOSIS — M5116 Intervertebral disc disorders with radiculopathy, lumbar region: Secondary | ICD-10-CM | POA: Diagnosis not present

## 2022-02-22 DIAGNOSIS — M9905 Segmental and somatic dysfunction of pelvic region: Secondary | ICD-10-CM | POA: Diagnosis not present

## 2022-03-21 DIAGNOSIS — J019 Acute sinusitis, unspecified: Secondary | ICD-10-CM | POA: Diagnosis not present

## 2022-06-13 ENCOUNTER — Encounter: Payer: Self-pay | Admitting: Internal Medicine

## 2022-11-27 ENCOUNTER — Other Ambulatory Visit: Payer: Self-pay | Admitting: Family

## 2022-11-27 ENCOUNTER — Ambulatory Visit: Payer: Self-pay

## 2022-11-27 DIAGNOSIS — M79672 Pain in left foot: Secondary | ICD-10-CM
